# Patient Record
Sex: Female | Born: 1940 | Race: White | Hispanic: No | Marital: Married | Smoking: Former smoker
Health system: Southern US, Community
[De-identification: ages and names within clinical notes are randomized; demographics above are authoritative.]

## PROBLEM LIST (undated history)

## (undated) DIAGNOSIS — J449 Chronic obstructive pulmonary disease, unspecified: Secondary | ICD-10-CM

## (undated) DIAGNOSIS — Z148 Genetic carrier of other disease: Secondary | ICD-10-CM

## (undated) DIAGNOSIS — N951 Menopausal and female climacteric states: Secondary | ICD-10-CM

## (undated) DIAGNOSIS — E785 Hyperlipidemia, unspecified: Secondary | ICD-10-CM

## (undated) DIAGNOSIS — I10 Essential (primary) hypertension: Secondary | ICD-10-CM

## (undated) DIAGNOSIS — J9690 Respiratory failure, unspecified, unspecified whether with hypoxia or hypercapnia: Secondary | ICD-10-CM

## (undated) HISTORY — PX: TONSILLECTOMY: SHX5217

## (undated) HISTORY — DX: Genetic carrier of other disease: Z14.8

## (undated) HISTORY — DX: Respiratory failure, unspecified, unspecified whether with hypoxia or hypercapnia: J96.90

## (undated) HISTORY — DX: Hyperlipidemia, unspecified: E78.5

## (undated) HISTORY — DX: Chronic obstructive pulmonary disease, unspecified: J44.9

## (undated) HISTORY — DX: Essential (primary) hypertension: I10

## (undated) HISTORY — DX: Menopausal and female climacteric states: N95.1

---

## 2000-01-26 ENCOUNTER — Ambulatory Visit (HOSPITAL_COMMUNITY): Admission: RE | Admit: 2000-01-26 | Discharge: 2000-01-26 | Payer: Self-pay | Admitting: Pulmonary Disease

## 2000-01-26 ENCOUNTER — Encounter: Payer: Self-pay | Admitting: Pulmonary Disease

## 2000-02-04 ENCOUNTER — Encounter (HOSPITAL_COMMUNITY): Admission: RE | Admit: 2000-02-04 | Discharge: 2000-05-04 | Payer: Self-pay | Admitting: Pulmonary Disease

## 2000-03-29 ENCOUNTER — Ambulatory Visit: Admission: RE | Admit: 2000-03-29 | Discharge: 2000-03-29 | Payer: Self-pay | Admitting: Pulmonary Disease

## 2000-05-05 ENCOUNTER — Encounter (HOSPITAL_COMMUNITY): Admission: RE | Admit: 2000-05-05 | Discharge: 2000-08-03 | Payer: Self-pay | Admitting: Pulmonary Disease

## 2003-10-02 ENCOUNTER — Emergency Department (HOSPITAL_COMMUNITY): Admission: EM | Admit: 2003-10-02 | Discharge: 2003-10-02 | Payer: Self-pay | Admitting: Emergency Medicine

## 2003-10-08 ENCOUNTER — Emergency Department (HOSPITAL_COMMUNITY): Admission: EM | Admit: 2003-10-08 | Discharge: 2003-10-08 | Payer: Self-pay | Admitting: Emergency Medicine

## 2006-04-26 ENCOUNTER — Ambulatory Visit: Payer: Self-pay | Admitting: Internal Medicine

## 2006-04-26 LAB — CONVERTED CEMR LAB
AST: 28 units/L (ref 0–37)
Cholesterol: 196 mg/dL (ref 0–200)

## 2006-05-31 ENCOUNTER — Ambulatory Visit: Payer: Self-pay | Admitting: Family Medicine

## 2006-06-16 ENCOUNTER — Ambulatory Visit: Payer: Self-pay | Admitting: Internal Medicine

## 2006-07-13 DIAGNOSIS — E785 Hyperlipidemia, unspecified: Secondary | ICD-10-CM

## 2006-07-13 DIAGNOSIS — N951 Menopausal and female climacteric states: Secondary | ICD-10-CM

## 2006-07-13 DIAGNOSIS — I10 Essential (primary) hypertension: Secondary | ICD-10-CM | POA: Insufficient documentation

## 2006-07-13 DIAGNOSIS — J449 Chronic obstructive pulmonary disease, unspecified: Secondary | ICD-10-CM

## 2006-07-13 DIAGNOSIS — J4489 Other specified chronic obstructive pulmonary disease: Secondary | ICD-10-CM

## 2006-07-13 HISTORY — DX: Hyperlipidemia, unspecified: E78.5

## 2006-07-13 HISTORY — DX: Menopausal and female climacteric states: N95.1

## 2006-07-13 HISTORY — DX: Essential (primary) hypertension: I10

## 2006-07-13 HISTORY — DX: Other specified chronic obstructive pulmonary disease: J44.89

## 2006-07-13 HISTORY — DX: Chronic obstructive pulmonary disease, unspecified: J44.9

## 2006-07-28 ENCOUNTER — Ambulatory Visit: Payer: Self-pay | Admitting: Internal Medicine

## 2006-10-19 ENCOUNTER — Ambulatory Visit: Payer: Self-pay | Admitting: Internal Medicine

## 2006-10-19 LAB — CONVERTED CEMR LAB
ALT: 30 units/L (ref 0–35)
AST: 30 units/L (ref 0–37)
Albumin: 4.2 g/dL (ref 3.5–5.2)
Alkaline Phosphatase: 60 units/L (ref 39–117)
BUN: 16 mg/dL (ref 6–23)
Basophils Absolute: 0 10*3/uL (ref 0.0–0.1)
Basophils Relative: 0.4 % (ref 0.0–1.0)
Bilirubin Urine: NEGATIVE
Bilirubin, Direct: 0.1 mg/dL (ref 0.0–0.3)
CO2: 35 meq/L — ABNORMAL HIGH (ref 19–32)
Calcium: 9.6 mg/dL (ref 8.4–10.5)
Chloride: 102 meq/L (ref 96–112)
Cholesterol: 202 mg/dL (ref 0–200)
Creatinine, Ser: 0.6 mg/dL (ref 0.4–1.2)
Direct LDL: 127.9 mg/dL
Eosinophils Absolute: 0.1 10*3/uL (ref 0.0–0.6)
Eosinophils Relative: 2.1 % (ref 0.0–5.0)
GFR calc Af Amer: 129 mL/min
GFR calc non Af Amer: 107 mL/min
Glucose, Bld: 81 mg/dL (ref 70–99)
Glucose, Urine, Semiquant: NEGATIVE
HCT: 41.3 % (ref 36.0–46.0)
HDL: 56.3 mg/dL (ref >39.0)
Hemoglobin: 14.6 g/dL (ref 12.0–15.0)
Ketones, urine, test strip: NEGATIVE
Lymphocytes Relative: 34 % (ref 12.0–46.0)
MCHC: 35.4 g/dL (ref 30.0–36.0)
MCV: 92.6 fL (ref 78.0–100.0)
Monocytes Absolute: 0.4 10*3/uL (ref 0.2–0.7)
Monocytes Relative: 9.3 % (ref 3.0–11.0)
Neutro Abs: 2.7 10*3/uL (ref 1.4–7.7)
Neutrophils Relative %: 54.2 % (ref 43.0–77.0)
Nitrite: NEGATIVE
Platelets: 223 10*3/uL (ref 150–400)
Potassium: 3.8 meq/L (ref 3.5–5.1)
Protein, U semiquant: NEGATIVE
RBC: 4.46 M/uL (ref 3.87–5.11)
RDW: 11.9 % (ref 11.5–14.6)
Sodium: 142 meq/L (ref 135–145)
Specific Gravity, Urine: 1.02
TSH: 0.82 microintl units/mL (ref 0.35–5.50)
Total Bilirubin: 1.2 mg/dL (ref 0.3–1.2)
Total CHOL/HDL Ratio: 3.6
Total Protein: 6.5 g/dL (ref 6.0–8.3)
Triglycerides: 134 mg/dL (ref 0–149)
Urobilinogen, UA: 0.2
VLDL: 27 mg/dL (ref 0–40)
WBC Urine, dipstick: NEGATIVE
WBC: 4.8 10*3/uL (ref 4.5–10.5)
pH: 5.5

## 2006-10-26 ENCOUNTER — Encounter: Payer: Self-pay | Admitting: Internal Medicine

## 2006-10-26 ENCOUNTER — Other Ambulatory Visit: Admission: RE | Admit: 2006-10-26 | Discharge: 2006-10-26 | Payer: Self-pay | Admitting: Internal Medicine

## 2006-10-26 ENCOUNTER — Ambulatory Visit: Payer: Self-pay | Admitting: Internal Medicine

## 2007-10-24 ENCOUNTER — Ambulatory Visit: Payer: Self-pay | Admitting: Internal Medicine

## 2007-10-24 DIAGNOSIS — H9209 Otalgia, unspecified ear: Secondary | ICD-10-CM | POA: Insufficient documentation

## 2007-11-07 ENCOUNTER — Ambulatory Visit: Payer: Self-pay | Admitting: Internal Medicine

## 2007-12-05 ENCOUNTER — Ambulatory Visit: Payer: Self-pay | Admitting: Internal Medicine

## 2008-10-22 ENCOUNTER — Telehealth: Payer: Self-pay | Admitting: Internal Medicine

## 2008-11-26 ENCOUNTER — Ambulatory Visit: Payer: Self-pay | Admitting: Internal Medicine

## 2008-11-26 ENCOUNTER — Encounter (INDEPENDENT_AMBULATORY_CARE_PROVIDER_SITE_OTHER): Payer: Self-pay | Admitting: *Deleted

## 2008-11-26 LAB — CONVERTED CEMR LAB
ALT: 27 units/L (ref 0–35)
AST: 27 units/L (ref 0–37)
Albumin: 4.5 g/dL (ref 3.5–5.2)
Alkaline Phosphatase: 48 units/L (ref 39–117)
BUN: 13 mg/dL (ref 6–23)
Basophils Absolute: 0 10*3/uL (ref 0.0–0.1)
Basophils Relative: 0 % (ref 0.0–3.0)
Bilirubin, Direct: 0.1 mg/dL (ref 0.0–0.3)
CO2: 34 meq/L — ABNORMAL HIGH (ref 19–32)
Calcium: 9.7 mg/dL (ref 8.4–10.5)
Chloride: 104 meq/L (ref 96–112)
Cholesterol: 174 mg/dL (ref 0–200)
Creatinine, Ser: 0.6 mg/dL (ref 0.4–1.2)
Eosinophils Absolute: 0.1 10*3/uL (ref 0.0–0.7)
Eosinophils Relative: 1.3 % (ref 0.0–5.0)
GFR calc non Af Amer: 105.78 mL/min (ref >60)
Glucose, Bld: 88 mg/dL (ref 70–99)
HCT: 42 % (ref 36.0–46.0)
HDL: 64.1 mg/dL (ref >39.00)
Hemoglobin: 14.1 g/dL (ref 12.0–15.0)
LDL Cholesterol: 91 mg/dL (ref 0–99)
Lymphocytes Relative: 33.6 % (ref 12.0–46.0)
Lymphs Abs: 1.5 10*3/uL (ref 0.7–4.0)
MCHC: 33.6 g/dL (ref 30.0–36.0)
MCV: 98.9 fL (ref 78.0–100.0)
Monocytes Absolute: 0.5 10*3/uL (ref 0.1–1.0)
Monocytes Relative: 10.4 % (ref 3.0–12.0)
Neutro Abs: 2.5 10*3/uL (ref 1.4–7.7)
Neutrophils Relative %: 54.7 % (ref 43.0–77.0)
Platelets: 213 10*3/uL (ref 150.0–400.0)
Potassium: 4.4 meq/L (ref 3.5–5.1)
RBC: 4.24 M/uL (ref 3.87–5.11)
RDW: 12 % (ref 11.5–14.6)
Sodium: 144 meq/L (ref 135–145)
TSH: 0.67 microintl units/mL (ref 0.35–5.50)
Total Bilirubin: 1.1 mg/dL (ref 0.3–1.2)
Total CHOL/HDL Ratio: 3
Total Protein: 7 g/dL (ref 6.0–8.3)
Triglycerides: 93 mg/dL (ref 0.0–149.0)
VLDL: 18.6 mg/dL (ref 0.0–40.0)
WBC: 4.6 10*3/uL (ref 4.5–10.5)

## 2009-01-02 ENCOUNTER — Encounter (INDEPENDENT_AMBULATORY_CARE_PROVIDER_SITE_OTHER): Payer: Self-pay | Admitting: *Deleted

## 2009-01-06 ENCOUNTER — Ambulatory Visit: Payer: Self-pay | Admitting: Gastroenterology

## 2009-01-16 ENCOUNTER — Telehealth: Payer: Self-pay | Admitting: Internal Medicine

## 2010-02-03 NOTE — Progress Notes (Signed)
  Phone Note From Pharmacy Call back at [phone]   Caller: cathy, [facility] Call For: k  Summary of Call: needs alternative to Atrovent- too expensive Initial call taken by: Raechel Ache, RN,  January 16, 2009 11:35 AM  Follow-up for Phone Call        atrovent is available in [facility] Follow-up by: Gordy Savers  MD,  January 16, 2009 12:07 PM  Additional Follow-up for Phone Call Additional follow up Details #1::        Pharmacist called Additional Follow-up by: Raechel Ache, RN,  January 16, 2009 12:32 PM

## 2010-02-03 NOTE — Miscellaneous (Signed)
Summary: DIRECT COLON SCREEN-AGE/YF  Clinical Lists Changes  Medications: Added new medication of MOVIPREP 100 GM  SOLR (PEG-KCL-NACL-NASULF-NA ASC-C) As directed - Signed Rx of MOVIPREP 100 GM  SOLR (PEG-KCL-NACL-NASULF-NA ASC-C) As directed;  #1 x 0;  Signed;  Entered by: Clide Cliff RN;  Authorized by: Rachael Fee MD;  Method used: Electronically to [id number], [place], [place], [place]  [address], Ph: [phone] or [phone], Fax: [phone] Observations: Added new observation of ALLERGY REV: Done (01/06/2009 10:16)    Prescriptions: MOVIPREP 100 GM  SOLR (PEG-KCL-NACL-NASULF-NA ASC-C) As directed  #1 x 0   Entered by:   Clide Cliff RN   Authorized by:   Rachael Fee MD   Signed by:   Clide Cliff RN on 01/06/2009   Method used:   Electronically to        [facility] [address] [phone]* (retail)       [address]       [place], [place]  [address]       Ph: [phone] or [phone]       Fax: [phone]   RxID:   [id number]

## 2010-02-09 ENCOUNTER — Encounter: Payer: Self-pay | Admitting: Internal Medicine

## 2010-02-09 ENCOUNTER — Ambulatory Visit (INDEPENDENT_AMBULATORY_CARE_PROVIDER_SITE_OTHER): Payer: PRIVATE HEALTH INSURANCE | Admitting: Internal Medicine

## 2010-02-09 DIAGNOSIS — I10 Essential (primary) hypertension: Secondary | ICD-10-CM

## 2010-02-09 DIAGNOSIS — E785 Hyperlipidemia, unspecified: Secondary | ICD-10-CM

## 2010-02-09 DIAGNOSIS — N951 Menopausal and female climacteric states: Secondary | ICD-10-CM

## 2010-02-09 DIAGNOSIS — J4489 Other specified chronic obstructive pulmonary disease: Secondary | ICD-10-CM

## 2010-02-09 DIAGNOSIS — J449 Chronic obstructive pulmonary disease, unspecified: Secondary | ICD-10-CM

## 2010-02-09 MED ORDER — IPRATROPIUM-ALBUTEROL 18-103 MCG/ACT IN AERO: 2.0000 | INHALATION_SPRAY | Freq: Four times a day (QID) | RESPIRATORY_TRACT | Status: DC | PRN

## 2010-02-09 MED ORDER — SIMVASTATIN 40 MG PO TABS: 40.0000 mg | ORAL_TABLET | Freq: Every day | ORAL | Status: DC

## 2010-02-09 MED ORDER — HYDROCHLOROTHIAZIDE 12.5 MG PO TABS: 12.5000 mg | ORAL_TABLET | Freq: Every day | ORAL | Status: DC

## 2010-02-09 MED ORDER — TRAMADOL HCL 50 MG PO TABS: 50.0000 mg | ORAL_TABLET | Freq: Four times a day (QID) | ORAL | Status: DC | PRN

## 2010-02-09 NOTE — Patient Instructions (Signed)
Return in 3 months for follow-up Call or return to clinic prn if these symptoms worsen or fail to improve as anticipated.  Take 650-100 mg of Tylenol every 6 hours as needed for pain relief or fever.  Avoid taking more than 3000 mg in a 24-hour period (  This may cause liver damage).

## 2010-02-09 NOTE — Progress Notes (Signed)
  Subjective:    Patient ID: Cathy Hensley, female    DOB: 11-07-1940, 70 y.o.   MRN: [id number]  HPI  70 year old patient who presents with a one week history of headaches.  They seem to be most marked in the right parietal scalp area.  She apparent has had some trauma in this area required extensive suturing.  Associated symptoms include some myalgias and a sense of low grade fever and chilliness.  No documented fever or frank chills.  She is a former smoker, discontinued in 2006.  Her COPD and pulmonary status seems stable.  She has hypertension, controlled on diuretic therapy, which has been well.  Remains on simvastatin 40 mg daily, which she Continues to tolerate well.  Review of Systems  Constitutional: Positive for fatigue.  HENT: Negative for hearing loss, congestion, sore throat, rhinorrhea, dental problem, sinus pressure and tinnitus.   Eyes: Negative for pain, discharge and visual disturbance.  Respiratory: Negative for cough and shortness of breath.   Cardiovascular: Positive for leg swelling. Negative for chest pain and palpitations.  Gastrointestinal: Negative for nausea, vomiting, abdominal pain, diarrhea, constipation, blood in stool and abdominal distention.  Genitourinary: Negative for dysuria, urgency, frequency, hematuria, flank pain, vaginal bleeding, vaginal discharge, difficulty urinating, vaginal pain and pelvic pain.  Musculoskeletal: Negative for joint swelling, arthralgias and gait problem.  Skin: Negative for rash.  Neurological: Negative for dizziness, syncope, speech difficulty, weakness, numbness and headaches.  Hematological: Negative for adenopathy. Does not bruise/bleed easily.  Psychiatric/Behavioral: Negative for behavioral problems, dysphoric mood and agitation. The patient is not nervous/anxious.        Objective:   Physical Exam  Constitutional: She is oriented to person, place, and time. She appears well-developed and well-nourished.  HENT:  Head:  Normocephalic and atraumatic.  Right Ear: External ear normal.  Left Ear: External ear normal.  Nose: Nose normal.  Mouth/Throat: Oropharynx is clear and moist.       No tenderness over the scalp region  Eyes: Conjunctivae and EOM are normal. Pupils are equal, round, and reactive to light.  Neck: Normal range of motion. Neck supple. No thyromegaly present.  Cardiovascular: Normal rate, regular rhythm, normal heart sounds and intact distal pulses.   Pulmonary/Chest: Effort normal and breath sounds normal.  Abdominal: Soft. Bowel sounds are normal. She exhibits no mass. There is no tenderness.  Musculoskeletal: Normal range of motion.       Trace ankle and pedal edema  Lymphadenopathy:    She has no cervical adenopathy.  Neurological: She is alert and oriented to person, place, and time.  Skin: Skin is warm and dry. No rash noted.  Psychiatric: She has a normal mood and affect. Her behavior is normal.          Assessment & Plan:  Headache, secondary to viral syndrome with myalgias-Will treat symptomatically with Tylenol  and if needed, tramadol for additional pain control Hypertension stable Hyperlipidemia stable COPD stable

## 2010-02-10 LAB — TSH: TSH: 0.87 u[IU]/mL (ref 0.35–5.50)

## 2010-02-10 LAB — HEPATIC FUNCTION PANEL
Albumin: 4.4 g/dL (ref 3.5–5.2)
Alkaline Phosphatase: 51 U/L (ref 39–117)
Bilirubin, Direct: 0.1 mg/dL (ref 0.0–0.3)
Total Bilirubin: 1 mg/dL (ref 0.3–1.2)

## 2010-02-10 LAB — CBC WITH DIFFERENTIAL/PLATELET
Basophils Absolute: 0 10*3/uL (ref 0.0–0.1)
Hemoglobin: 15.4 g/dL — ABNORMAL HIGH (ref 12.0–15.0)
Lymphocytes Relative: 20.8 % (ref 12.0–46.0)
Monocytes Relative: 10.1 % (ref 3.0–12.0)
Neutro Abs: 5.1 10*3/uL (ref 1.4–7.7)
Platelets: 250 10*3/uL (ref 150.0–400.0)
RDW: 14.4 % (ref 11.5–14.6)

## 2010-02-10 LAB — BASIC METABOLIC PANEL
Calcium: 10.2 mg/dL (ref 8.4–10.5)
GFR: 146.9 mL/min (ref >60.00)
Glucose, Bld: 81 mg/dL (ref 70–99)
Sodium: 144 mEq/L (ref 135–145)

## 2010-02-10 LAB — LIPID PANEL
HDL: 61.2 mg/dL (ref >39.00)
VLDL: 19.6 mg/dL (ref 0.0–40.0)

## 2010-02-17 ENCOUNTER — Ambulatory Visit: Payer: Self-pay | Admitting: Internal Medicine

## 2010-03-04 ENCOUNTER — Encounter: Payer: Self-pay | Admitting: Internal Medicine

## 2010-03-05 ENCOUNTER — Encounter: Payer: Self-pay | Admitting: Internal Medicine

## 2010-03-05 ENCOUNTER — Ambulatory Visit (INDEPENDENT_AMBULATORY_CARE_PROVIDER_SITE_OTHER): Payer: PRIVATE HEALTH INSURANCE | Admitting: Internal Medicine

## 2010-03-05 VITALS — BP 140/90 | HR 80 | Temp 98.1°F | Resp 18 | Wt 105.0 lb

## 2010-03-05 DIAGNOSIS — I279 Pulmonary heart disease, unspecified: Secondary | ICD-10-CM

## 2010-03-05 DIAGNOSIS — J449 Chronic obstructive pulmonary disease, unspecified: Secondary | ICD-10-CM

## 2010-03-05 DIAGNOSIS — I10 Essential (primary) hypertension: Secondary | ICD-10-CM

## 2010-03-05 DIAGNOSIS — I2781 Cor pulmonale (chronic): Secondary | ICD-10-CM

## 2010-03-05 MED ORDER — TIOTROPIUM BROMIDE MONOHYDRATE 18 MCG IN CAPS: 18.0000 ug | ORAL_CAPSULE | Freq: Every day | RESPIRATORY_TRACT | Status: DC

## 2010-03-05 MED ORDER — ALBUTEROL SULFATE HFA 108 (90 BASE) MCG/ACT IN AERS: 2.0000 | INHALATION_SPRAY | Freq: Four times a day (QID) | RESPIRATORY_TRACT | Status: DC | PRN

## 2010-03-05 MED ORDER — FUROSEMIDE 20 MG PO TABS: 20.0000 mg | ORAL_TABLET | Freq: Every day | ORAL | Status: DC

## 2010-03-05 MED ORDER — BUDESONIDE-FORMOTEROL FUMARATE 160-4.5 MCG/ACT IN AERO: 2.0000 | INHALATION_SPRAY | Freq: Two times a day (BID) | RESPIRATORY_TRACT | Status: DC

## 2010-03-05 NOTE — Patient Instructions (Addendum)
Pulmonary followup as scheduled   2-D echocardiogram as scheduled   Use oxygen therapy 24 hours per day   Return office visit 2 weeks  Limit your sodium (Salt) intake

## 2010-03-05 NOTE — Progress Notes (Signed)
Subjective:    Patient ID: Cathy Hensley, female    DOB: 10-07-1940, 70 y.o.   MRN: [id number]  HPI   70 year old patient who has a history of COPD but is in quite infrequently. She has been evaluated by pulmonary medicine in the past. Her present medical regimen includes Combivent. Due to a worsening peripheral edema she was seen  At a [place] emergency room on February 26. She presented with a chief complaint of worsening pedal edema. The patient states that she has had fatigue and a general sense of unwellness for several months. She tends to minimize her symptoms;  evaluation included a chest CT to rule out  Pulmonary  embolism in view of marked hypoxemia.   BNP was elevated at 1237; an arterial blood gas revealed a PCO2 of  59,  PO2 of  41 and  oxygen saturation of 75%. Her peripheral edema has modestly improved she remains on hydrochlorothiazide for hypertensive control.  She states that the peripheral edema has worsened over the past 4-5 weeks;  she discontinued tobacco use in 2003.       Review of Systems  Constitutional: Positive for fatigue.  HENT: Negative for hearing loss, congestion, sore throat, rhinorrhea, dental problem, sinus pressure and tinnitus.   Eyes: Negative for pain, discharge and visual disturbance.  Respiratory: Positive for shortness of breath. Negative for cough.   Cardiovascular: Positive for leg swelling. Negative for chest pain and palpitations.  Gastrointestinal: Negative for nausea, vomiting, abdominal pain, diarrhea, constipation, blood in stool and abdominal distention.  Genitourinary: Negative for dysuria, urgency, frequency, hematuria, flank pain, vaginal bleeding, vaginal discharge, difficulty urinating, vaginal pain and pelvic pain.  Musculoskeletal: Negative for joint swelling, arthralgias and gait problem.  Skin: Negative for rash.  Neurological: Negative for dizziness, syncope, speech difficulty, weakness, numbness and headaches.  Hematological: Negative  for adenopathy.  Psychiatric/Behavioral: Negative for behavioral problems, dysphoric mood and agitation. The patient is not nervous/anxious.        Objective:   Physical Exam  Constitutional: She is oriented to person, place, and time. She appears well-developed and well-nourished.        Appears chronically ill no acute distress at rest  HENT:  Head: Normocephalic.  Right Ear: External ear normal.  Left Ear: External ear normal.  Mouth/Throat: Oropharynx is clear and moist.  Eyes: Conjunctivae and EOM are normal. Pupils are equal, round, and reactive to light.  Neck: Normal range of motion. Neck supple. No JVD present. No thyromegaly present.  Cardiovascular: Normal rate, regular rhythm, normal heart sounds and intact distal pulses.   Pulmonary/Chest: Effort normal. No respiratory distress. She has no wheezes. She has no rales. She exhibits no tenderness.        Generally diminished breath sounds  Oxygen saturation 85%  Pulse rate 90  Abdominal: Soft. Bowel sounds are normal. She exhibits no mass. There is no tenderness.  Musculoskeletal: Normal range of motion. She exhibits edema.        +2 pedal and ankle edema  Lymphadenopathy:    She has no cervical adenopathy.  Neurological: She is alert and oriented to person, place, and time.  Skin: Skin is warm and dry. No rash noted.  Psychiatric: She has a normal mood and affect. Her behavior is normal.          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 Will refer to pulmonary medicine we'll discontinue Combivent and substitute albuterol. We'll add Spiriva and Symbicort;   The patient will require chronic oxygen therapy and may benefit from pulmonary rehabilitation.  we'll substitute furosemide for hydrochlorothiazide  Salt restriction encouraged

## 2010-03-18 ENCOUNTER — Encounter: Payer: Self-pay | Admitting: Internal Medicine

## 2010-03-19 ENCOUNTER — Ambulatory Visit (INDEPENDENT_AMBULATORY_CARE_PROVIDER_SITE_OTHER): Payer: PRIVATE HEALTH INSURANCE | Admitting: Internal Medicine

## 2010-03-19 ENCOUNTER — Encounter: Payer: Self-pay | Admitting: Internal Medicine

## 2010-03-19 DIAGNOSIS — Z0000 Encounter for general adult medical examination without abnormal findings: Secondary | ICD-10-CM

## 2010-03-19 DIAGNOSIS — J449 Chronic obstructive pulmonary disease, unspecified: Secondary | ICD-10-CM

## 2010-03-19 DIAGNOSIS — Z23 Encounter for immunization: Secondary | ICD-10-CM

## 2010-03-19 DIAGNOSIS — I10 Essential (primary) hypertension: Secondary | ICD-10-CM

## 2010-03-19 NOTE — Progress Notes (Signed)
  Subjective:    Patient ID: Cathy Hensley, female    DOB: [birth year], 70 y.o.   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 She is seen today in two-week followup and doing much improved. Furosemide was substituted for hydrochlorothiazide and her pedal edema has resolved she feels much improved breathing better and has been compliant with her oxygen therapy. Her weight is up modestly she is accompanied by her daughter who is also pleased with her nice progress. The plan was to refer her to pulmonary medicine and consider pulmonary rehabilitation but the patient is quite resistant at this time   Review of Systems  Constitutional: Negative.   HENT: Negative for hearing loss, congestion, sore throat, rhinorrhea, dental problem, sinus pressure and tinnitus.   Eyes: Negative for pain, discharge and visual disturbance.  Respiratory: Positive for shortness of breath. Negative for cough.   Cardiovascular: Negative for chest pain, palpitations and leg swelling.  Gastrointestinal: Negative for nausea, vomiting, abdominal pain, diarrhea, constipation, blood in stool and abdominal distention.  Genitourinary: Negative for dysuria, urgency, frequency, hematuria, flank pain, vaginal bleeding, vaginal discharge, difficulty urinating, vaginal pain and pelvic pain.  Musculoskeletal: Negative for joint swelling, arthralgias and gait problem.  Skin: Negative for rash.  Neurological: Negative for dizziness, syncope, speech difficulty, weakness, numbness and headaches.  Hematological: Negative for adenopathy.  Psychiatric/Behavioral: Negative for behavioral problems, dysphoric mood and agitation. The patient is not nervous/anxious.        Objective:   Physical Exam  Constitutional: She is oriented to person, place, and time. She appears well-developed and well-nourished.  HENT:  Head: Normocephalic.  Right Ear: External ear normal.  Left  Ear: External ear normal.  Mouth/Throat: Oropharynx is clear and moist.  Eyes: Conjunctivae and EOM are normal. Pupils are equal, round, and reactive to light.  Neck: Normal range of motion. Neck supple. No thyromegaly present.  Cardiovascular: Normal rate, regular rhythm, normal heart sounds and intact distal pulses.   Pulmonary/Chest: Effort normal. No respiratory distress. She has no wheezes. She has no rales.        Generally  diminished breath sounds but clear  Abdominal: Soft. Bowel sounds are normal. She exhibits no mass. There is no tenderness.  Musculoskeletal: Normal range of motion. She exhibits no edema.  Lymphadenopathy:    She has no cervical adenopathy.  Neurological: She is alert and oriented to person, place, and time.  Skin: Skin is warm and dry. No rash noted.  Psychiatric: She has a normal mood and affect. Her behavior is normal.          Assessment & Plan:   advanced COPD with chronic hypoxic  Respiratory failure  improved  pedal edema improved   Pulmonary referral and rehabilitation discussed and she wishes to defer at this time. We'll give a Pneumovax and recheck in 3 months

## 2010-03-19 NOTE — Patient Instructions (Signed)
Limit your sodium (Salt) intake  Return in 3 months for follow-up   

## 2010-05-19 NOTE — Assessment & Plan Note (Signed)
[facility]                             PULMONARY OFFICE NOTE   NAME:Cathy Hensley                            MRN:          [id number]  DATE:06/16/2006                            DOB:          Sep 22, 1940    REASON FOR CONSULTATION:  COPD evaluation.   HISTORY:  This is a 70 year old white female who quit smoking 5 years  ago because of severe dyspnea and cough.  She said she had done  wonderfully for several years, but within the last several years, had  increasing dyspnea, for which she needed Atrovent.  She also had tried  Advair and Singulair, and says of all the medicines the 1 that seemed to  make the most difference was Singulair, which also helped her rhinitis.  She comes back in today having run out of all of her medicines, except  for Atrovent, stating that she is not doing as well.   She had been seen, actually, by Dr. Sung Amabile, who is not convinced that  she had significant COPD and recommended first stopping fenoterol and  then using Atrovent sparingly.  She found no difference on or off  fenoterol, but does feel she could take the Atrovent from 0 up to 4  times daily and control her symptoms effectively.   She denies any obvious weather or environmental trigger.  Presently, she  complains of dyspnea with anything more than slow ADL, but denies any  nocturnal exacerbation, obvious weather or environmental triggers.   PAST MEDICAL HISTORY:  Significant for COPD, hypertension.   ALLERGIES:  No known.   MEDICATIONS:  Zocor.  Hydrochlorothiazide.  Prednisone, which she is now  tapering off of after a recent exacerbation and states it helped only a  little.   SOCIAL HISTORY:  She quit smoking 5 years ago.  She is self-employed as  an Production designer, theatre/television/film.   FAMILY HISTORY:  Positive for emphysema in mother, who was a smoker.   REVIEW OF SYSTEMS:  Taken in detail on the worksheet and negative,  except as outlined above.   PHYSICAL EXAMINATION:   This is a stoic, ambulatory white female with an  unusual affect who actually didn't answer a single question I asked her  in a straight-forward fashion.  VITAL SIGNS:  Stable vital signs.  HEENT:  Unremarkable.  Oropharynx is clear.  NECK:  Supple without cervical adenopathy or tenderness. Trachea is  midline.  No thyromegaly.  LUNGS:  The lung fields reveal diminished breath sounds bilaterally.  No  wheezing.  There is a positive Hoover sign at the end of inspiration.  CARDIAC:  Regular rate and rhythm without murmur, gallop, or rub.  ABDOMEN:  Soft and benign.  EXTREMITIES:  Warm without calf tenderness, cyanosis, clubbing, or  edema.   PFTs were reviewed from 2002 indicating an FEV1 at 66% predicted with a  ratio of 54%.  This, however, was while she was still smoking.  Diffusion capacity was 68% also.   IMPRESSION:  This patient has at least moderate chronic obstructive  pulmonary disease by previous PFTs, but interestingly feels  the best  when she takes Singulair I believe because it helps with both the  rhinitis and the asthmatic component of her problem.  If this is true,  she should be able to restart the Singulair and come off the prednisone  smoothly with no flare-up of her symptoms.   I, therefore, recommended the following.  1. Singulair 10 mg q. p.m. for the next 6 weeks.  2. Continue to use Atrovent on a p.r.n. basis.  3. Followup.  If her symptoms exacerbate on Singulair, I would abandon      it in favor of Advair if she prefers a PPI, or Symbicort (not      approved for COPD, but if she has significant exacerbation it is      probably an asthmatic in this setting) if she prefers HFA.     Cathy Hensley. Cathy Sires, MD, [facility]  Electronically Signed    MBW/MedQ  DD: 06/16/2006  DT: 06/17/2006  Job #: [id number]   cc:   Cathy Savers, MD

## 2010-05-19 NOTE — Assessment & Plan Note (Signed)
[facility]                             PULMONARY OFFICE NOTE   NAME:Cathy Hensley                            MRN:          [id number]  DATE:07/28/2006                            DOB:          1940/07/03    PULMONARY EXTENDED OFFICE EVALUATION:   HISTORY:  A 70 year old white female, former smoker, returns for PFTs as  requested, complaining of shortness of breath with anything more than  slow ADLs.  She has remained oxygen dependent at home.  She denies any  fevers, chills, orthopnea, PND, or leg swelling.   PHYSICAL EXAMINATION:  She is a pleasant, ambulatory white female in no  acute distress.  She is afebrile with normal vital signs.  HEENT:  Unremarkable.  Her oropharynx is clear.  LUNGS:  Lung fields reveal diminished breath sounds bilaterally.  No  wheezing.  HEART:  Regular rhythm without murmur, rub or gallop.  ABDOMEN:  Soft, benign.  EXTREMITIES:  Warm without calf tenderness, clubbing, cyanosis or edema.   PFTs were performed today and indicate an FEV1 of 36% predicted with no  improvement after bronchodilators.  Diffusion capacity was 58%, and she  had an FRC of 151% predicted.   IMPRESSION:  This patient has classic emphysematous chronic obstructive  pulmonary disease that has worsened since her previous evaluation in  2002 substantially.  Unfortunately, she resumed smoking since her  evaluation in 2002, and I think that is the most likely explanation.  She does not have the typical frequent cough or exacerbations of  asthmatic chronic obstructive pulmonary disease, and I believe this is  predominantly emphysema.   I therefore recommend the following:  1. A trial of Spiriva 18 mcg daily.  2. I do not really believe Singulair is benefitting her, except      perhaps in terms of treatment and rhinitis.  I think it is fine to      stop it to see if any symptoms flare off of Singulair.  I spent      extra time teaching her Spiriva and  going over this and how to use      it effectively and her PFTs and the reasoning behind the      recommendation to take Spiriva and what she can expect (improved      activity tolerance).  3. Finally, I have arranged to see her back in three months, sooner if      needed.     Charlaine Dalton. Sherene Sires, MD, [facility]  Electronically Signed    MBW/MedQ  DD: 07/28/2006  DT: 07/29/2006  Job #: [id number]   cc:   Gordy Savers, MD

## 2010-05-22 NOTE — Assessment & Plan Note (Signed)
 HEALTHCARE                            [facility] OFFICE NOTE   NAME:Cathy Hensley                            MRN:          [id number]  DATE:04/26/2006                            DOB:          Nov 22, 1940    A 70 year old female seen today to establish with our practice.  She had  [facility] age 18.  Medical  problems include hypertension, dyslipidemia, and COPD.  She was  hospitalized in 1953 for a tonsillectomy.  She is a gravida II, para II,  abort zero.  She was placed on statin therapy last year.   REVIEW OF SYSTEMS:  Exam is negative.  Did have a full exam in October  2007 including pelvic and Pap.  Also, had a mammogram last year.  No  colon screening.   FAMILY HISTORY:  Father died in his 17s of alcohol related  complications.  Mother died at 39 of a stroke.  She also had a AAA  repair.  Two sisters deceased, one from lymphoma, one from cerebral  aneurysm.  One sister living who is status post vascular repair of a  AAA.   EXAMINATION:  Revealed a thin, healthy-appearing female, in no acute  distress.  Blood pressure is 140/80.  Home readings are much lower.  SKIN:  Negative.  Fundi, ears, and throat clear.  NECK:  No bruits or adenopathy.  CHEST:  Was clear.  CARDIOVASCULAR EXAM:  Normal heart sounds.  No murmurs.  ABDOMEN:  Benign.  No organomegaly or bruits.  EXTREMITIES:  Full peripheral pulses.  No edema.  NEUROLOGIC:  Negative.   IMPRESSION:  1. Hypertension.  2. Hyperlipidemia.  3. Menopausal syndrome.  4. Mild chronic obstructive pulmonary disease.   DISPOSITION:  Medical regimen unchanged.  She was also given a  prescription for albuterol.  Will reassess in 6 months.     Gordy Savers, MD  Electronically Signed    PFK/MedQ  DD: 04/26/2006  DT: 04/26/2006  Job #: [id number]

## 2010-06-18 ENCOUNTER — Encounter: Payer: Self-pay | Admitting: Internal Medicine

## 2010-06-18 ENCOUNTER — Ambulatory Visit (INDEPENDENT_AMBULATORY_CARE_PROVIDER_SITE_OTHER): Payer: PRIVATE HEALTH INSURANCE | Admitting: Internal Medicine

## 2010-06-18 DIAGNOSIS — J449 Chronic obstructive pulmonary disease, unspecified: Secondary | ICD-10-CM

## 2010-06-18 DIAGNOSIS — I10 Essential (primary) hypertension: Secondary | ICD-10-CM

## 2010-06-18 MED ORDER — TIOTROPIUM BROMIDE MONOHYDRATE 18 MCG IN CAPS: 18.0000 ug | ORAL_CAPSULE | Freq: Every day | RESPIRATORY_TRACT | Status: DC

## 2010-06-18 NOTE — Progress Notes (Signed)
  Subjective:    Patient ID: Cathy Hensley, female    DOB: 1940-11-23, 70 y.o.   MRN: [id number]  HPI  70 year old patient has a history of advanced COPD she has been on home oxygen that she basically uses only at night. She does have her own home oximeter and apparently her O2 saturations have been fine. She remains on Symbicort as well as Spiriva. Her palmar status has been stable. She has hypertension as well as dyslipidemia. No new concerns or complaints her blood pressure has been controlled off medications    Review of Systems  Constitutional: Negative.   HENT: Negative for hearing loss, congestion, sore throat, rhinorrhea, dental problem, sinus pressure and tinnitus.   Eyes: Negative for pain, discharge and visual disturbance.  Respiratory: Positive for shortness of breath. Negative for cough.   Cardiovascular: Negative for chest pain, palpitations and leg swelling.  Gastrointestinal: Negative for nausea, vomiting, abdominal pain, diarrhea, constipation, blood in stool and abdominal distention.  Genitourinary: Negative for dysuria, urgency, frequency, hematuria, flank pain, vaginal bleeding, vaginal discharge, difficulty urinating, vaginal pain and pelvic pain.  Musculoskeletal: Negative for joint swelling, arthralgias and gait problem.  Skin: Negative for rash.  Neurological: Negative for dizziness, syncope, speech difficulty, weakness, numbness and headaches.  Hematological: Negative for adenopathy.  Psychiatric/Behavioral: Negative for behavioral problems, dysphoric mood and agitation. The patient is not nervous/anxious.        Objective:   Physical Exam  Constitutional: She is oriented to person, place, and time. She appears well-developed and well-nourished.  HENT:  Head: Normocephalic.  Right Ear: External ear normal.  Left Ear: External ear normal.  Mouth/Throat: Oropharynx is clear and moist.  Eyes: Conjunctivae and EOM are normal. Pupils are equal, round, and reactive to  light.  Neck: Normal range of motion. Neck supple. No thyromegaly present.  Cardiovascular: Normal rate, regular rhythm, normal heart sounds and intact distal pulses.   Pulmonary/Chest: Effort normal.       Breath sounds diminished but clear. O2 saturation on room air 98%  Abdominal: Soft. Bowel sounds are normal. She exhibits no mass. There is no tenderness.  Musculoskeletal: Normal range of motion.  Lymphadenopathy:    She has no cervical adenopathy.  Neurological: She is alert and oriented to person, place, and time.  Skin: Skin is warm and dry. No rash noted.  Psychiatric: She has a normal mood and affect. Her behavior is normal.          Assessment & Plan:   COPD. Improved normal oxygenation on room air. We'll discontinue oxygen therapy. We'll continue aggressive pulmonary medications Hypertension stable off medications Dyslipidemia.  We'll recheck in 3 months

## 2010-06-18 NOTE — Patient Instructions (Signed)
Limit your sodium (Salt) intake    It is important that you exercise regularly, at least 20 minutes 3 to 4 times per week.  If you develop chest pain or shortness of breath seek  medical attention.  Return in 3 months for follow-up  

## 2010-06-25 ENCOUNTER — Telehealth: Payer: Self-pay | Admitting: Internal Medicine

## 2010-06-25 NOTE — Telephone Encounter (Signed)
Inquiring about getting a whooping cough. She cares for her grandchildren. Had a tetanus shot 6 years ago. Please call her tomorrow.

## 2010-06-26 NOTE — Telephone Encounter (Signed)
Advised patient to come in for Tdap

## 2010-06-29 ENCOUNTER — Ambulatory Visit (INDEPENDENT_AMBULATORY_CARE_PROVIDER_SITE_OTHER): Payer: PRIVATE HEALTH INSURANCE | Admitting: Family Medicine

## 2010-06-29 DIAGNOSIS — Z23 Encounter for immunization: Secondary | ICD-10-CM

## 2010-06-30 ENCOUNTER — Telehealth: Payer: Self-pay | Admitting: *Deleted

## 2010-06-30 NOTE — Telephone Encounter (Signed)
Pt states Selena Batten was supposed to have documentation sent to [facility] advising that pt has been released from oxygen therapy so that the equipment can be picked up.  Needs this to be done ASAP since patient is leaving to go back to [place].  Spoke with Tamela Oddi at [facility], ov note faxed to [facility].

## 2010-09-21 ENCOUNTER — Ambulatory Visit: Payer: PRIVATE HEALTH INSURANCE | Admitting: Internal Medicine

## 2010-10-01 ENCOUNTER — Ambulatory Visit: Payer: PRIVATE HEALTH INSURANCE | Admitting: Internal Medicine

## 2010-10-05 ENCOUNTER — Encounter: Payer: Self-pay | Admitting: Internal Medicine

## 2010-10-05 ENCOUNTER — Ambulatory Visit (INDEPENDENT_AMBULATORY_CARE_PROVIDER_SITE_OTHER): Payer: PRIVATE HEALTH INSURANCE | Admitting: Internal Medicine

## 2010-10-05 VITALS — BP 140/80 | Temp 98.2°F | Wt 108.0 lb

## 2010-10-05 DIAGNOSIS — J449 Chronic obstructive pulmonary disease, unspecified: Secondary | ICD-10-CM

## 2010-10-05 DIAGNOSIS — Z0000 Encounter for general adult medical examination without abnormal findings: Secondary | ICD-10-CM

## 2010-10-05 DIAGNOSIS — Z23 Encounter for immunization: Secondary | ICD-10-CM

## 2010-10-05 DIAGNOSIS — I10 Essential (primary) hypertension: Secondary | ICD-10-CM

## 2010-10-05 MED ORDER — TIOTROPIUM BROMIDE MONOHYDRATE 18 MCG IN CAPS: 18.0000 ug | ORAL_CAPSULE | Freq: Every day | RESPIRATORY_TRACT | Status: DC

## 2010-10-05 NOTE — Progress Notes (Signed)
  Subjective:    Patient ID: Cathy Hensley, female    DOB: 23-Mar-1940, 70 y.o.   MRN: [id number]  HPI  70 year old patient who has a history of fairly advanced COPD. She has done remarkably well since discontinuation of tobacco. She does monitor her home oxygen saturations with good readings. She is on a regimen of inhalational medications. Due to cost considerations she has been off Spiriva for the past 2 weeks. She has noted a minimal increase in her dyspnea. Otherwise she has done remarkably well no concerns or complaints. She is quite pleased with her status    Review of Systems  Constitutional: Negative.   HENT: Negative for hearing loss, congestion, sore throat, rhinorrhea, dental problem, sinus pressure and tinnitus.   Eyes: Negative for pain, discharge and visual disturbance.  Respiratory: Positive for shortness of breath. Negative for cough.   Cardiovascular: Negative for chest pain, palpitations and leg swelling.  Gastrointestinal: Negative for nausea, vomiting, abdominal pain, diarrhea, constipation, blood in stool and abdominal distention.  Genitourinary: Negative for dysuria, urgency, frequency, hematuria, flank pain, vaginal bleeding, vaginal discharge, difficulty urinating, vaginal pain and pelvic pain.  Musculoskeletal: Negative for joint swelling, arthralgias and gait problem.  Skin: Negative for rash.  Neurological: Negative for dizziness, syncope, speech difficulty, weakness, numbness and headaches.  Hematological: Negative for adenopathy.  Psychiatric/Behavioral: Negative for behavioral problems, dysphoric mood and agitation. The patient is not nervous/anxious.        Objective:   Physical Exam  Constitutional: She is oriented to person, place, and time. She appears well-developed and well-nourished.  HENT:  Head: Normocephalic.  Right Ear: External ear normal.  Left Ear: External ear normal.  Mouth/Throat: Oropharynx is clear and moist.  Eyes: Conjunctivae and EOM are  normal. Pupils are equal, round, and reactive to light.  Neck: Normal range of motion. Neck supple. No thyromegaly present.  Cardiovascular: Normal rate, regular rhythm, normal heart sounds and intact distal pulses.   Pulmonary/Chest: Effort normal and breath sounds normal.       Generally diminished breath sounds Oxygen saturations 93-95%  Abdominal: Soft. Bowel sounds are normal. She exhibits no mass. There is no tenderness.  Musculoskeletal: Normal range of motion.  Lymphadenopathy:    She has no cervical adenopathy.  Neurological: She is alert and oriented to person, place, and time.  Skin: Skin is warm and dry. No rash noted.  Psychiatric: She has a normal mood and affect. Her behavior is normal.          Assessment & Plan:   COPD. Well compensated Hypertension controlled  Medications will be unchanged. She will be rechecked in 6 months or when necessary.

## 2010-10-05 NOTE — Patient Instructions (Signed)
Limit your sodium (Salt) intake  Return in 6 months for follow-up  

## 2010-12-18 ENCOUNTER — Telehealth: Payer: Self-pay | Admitting: Internal Medicine

## 2010-12-18 NOTE — Telephone Encounter (Signed)
Please advise 

## 2010-12-18 NOTE — Telephone Encounter (Signed)
Pt is in [place] and is having symptoms of a sinus infections and has questions about medication she should take. Please contact

## 2010-12-18 NOTE — Telephone Encounter (Signed)
advil sinus  Every 8 hours

## 2010-12-18 NOTE — Telephone Encounter (Signed)
Spoke with pt - informed of dr. Vernon Prey instructions - no improvement call on monday

## 2010-12-30 ENCOUNTER — Telehealth: Payer: Self-pay | Admitting: Internal Medicine

## 2010-12-30 MED ORDER — DOXYCYCLINE HYCLATE 100 MG PO TABS: 100.0000 mg | ORAL_TABLET | Freq: Two times a day (BID) | ORAL | Status: AC

## 2010-12-30 NOTE — Telephone Encounter (Signed)
Pt called a week or so ago. She is in [place] with her children. She called because she thought she had sinus trouble. Was told what to take OTC, and that if it wasn't cleared up, she could get a Rx. It is not better. Please call.

## 2010-12-30 NOTE — Telephone Encounter (Signed)
Please advise - wants abx - otc x10 days - not helping

## 2010-12-30 NOTE — Telephone Encounter (Signed)
Doxycycline 100 mg  #14 one BID

## 2010-12-30 NOTE — Telephone Encounter (Signed)
Addended by: Duard Brady I on: 12/30/2010 01:23 PM   Modules accepted: Orders

## 2011-02-22 ENCOUNTER — Other Ambulatory Visit: Payer: Self-pay | Admitting: Internal Medicine

## 2011-03-05 ENCOUNTER — Other Ambulatory Visit: Payer: Self-pay | Admitting: Internal Medicine

## 2011-04-05 ENCOUNTER — Ambulatory Visit: Payer: PRIVATE HEALTH INSURANCE | Admitting: Internal Medicine

## 2011-04-21 ENCOUNTER — Other Ambulatory Visit: Payer: Self-pay | Admitting: Internal Medicine

## 2011-04-30 ENCOUNTER — Ambulatory Visit: Payer: PRIVATE HEALTH INSURANCE | Admitting: Internal Medicine

## 2011-05-03 ENCOUNTER — Ambulatory Visit (INDEPENDENT_AMBULATORY_CARE_PROVIDER_SITE_OTHER): Payer: PRIVATE HEALTH INSURANCE | Admitting: Internal Medicine

## 2011-05-03 ENCOUNTER — Encounter: Payer: Self-pay | Admitting: Internal Medicine

## 2011-05-03 ENCOUNTER — Ambulatory Visit: Payer: PRIVATE HEALTH INSURANCE | Admitting: Internal Medicine

## 2011-05-03 VITALS — BP 126/80 | Temp 98.2°F | Wt 112.0 lb

## 2011-05-03 DIAGNOSIS — E785 Hyperlipidemia, unspecified: Secondary | ICD-10-CM

## 2011-05-03 DIAGNOSIS — J449 Chronic obstructive pulmonary disease, unspecified: Secondary | ICD-10-CM

## 2011-05-03 DIAGNOSIS — J4489 Other specified chronic obstructive pulmonary disease: Secondary | ICD-10-CM

## 2011-05-03 DIAGNOSIS — J309 Allergic rhinitis, unspecified: Secondary | ICD-10-CM

## 2011-05-03 DIAGNOSIS — I10 Essential (primary) hypertension: Secondary | ICD-10-CM

## 2011-05-03 MED ORDER — FUROSEMIDE 20 MG PO TABS: 20.0000 mg | ORAL_TABLET | Freq: Every day | ORAL | Status: DC

## 2011-05-03 MED ORDER — FLUTICASONE PROPIONATE 50 MCG/ACT NA SUSP: 2.0000 | Freq: Every day | NASAL | Status: DC

## 2011-05-03 MED ORDER — TIOTROPIUM BROMIDE MONOHYDRATE 18 MCG IN CAPS: 18.0000 ug | ORAL_CAPSULE | Freq: Every day | RESPIRATORY_TRACT | Status: DC

## 2011-05-03 MED ORDER — BUDESONIDE-FORMOTEROL FUMARATE 160-4.5 MCG/ACT IN AERO: 2.0000 | INHALATION_SPRAY | Freq: Two times a day (BID) | RESPIRATORY_TRACT | Status: DC

## 2011-05-03 MED ORDER — SIMVASTATIN 40 MG PO TABS: 40.0000 mg | ORAL_TABLET | Freq: Every day | ORAL | Status: DC

## 2011-05-03 NOTE — Progress Notes (Signed)
  Subjective:    Patient ID: Cathy Hensley, female    DOB: [birth year], 71 y.o.   MRN: [id number]  HPI  71 year old patient who is seen today for her six-month followup. She has a history of advanced COPD. She monitors home oximetry readings and usually runs in the low 90s. She had required oxygen therapy in the past after an acute exacerbation of COPD. She remains on inhalational medications. In general she is doing quite well except for some nasal stuffiness. She has spent much time in [place] after the arrival of a grandchild. She states that she has been treated with prednisone and antibiotic therapy recently for a sinusitis. In general doing reasonably well today    Review of Systems  Constitutional: Negative.   HENT: Negative for hearing loss, congestion, sore throat, rhinorrhea, dental problem, sinus pressure and tinnitus.   Eyes: Negative for pain, discharge and visual disturbance.  Respiratory: Positive for shortness of breath. Negative for cough.   Cardiovascular: Negative for chest pain, palpitations and leg swelling.  Gastrointestinal: Negative for nausea, vomiting, abdominal pain, diarrhea, constipation, blood in stool and abdominal distention.  Genitourinary: Negative for dysuria, urgency, frequency, hematuria, flank pain, vaginal bleeding, vaginal discharge, difficulty urinating, vaginal pain and pelvic pain.  Musculoskeletal: Negative for joint swelling, arthralgias and gait problem.  Skin: Negative for rash.  Neurological: Negative for dizziness, syncope, speech difficulty, weakness, numbness and headaches.  Hematological: Negative for adenopathy.  Psychiatric/Behavioral: Negative for behavioral problems, dysphoric mood and agitation. The patient is not nervous/anxious.        Objective:   Physical Exam  Constitutional: She is oriented to person, place, and time. She appears well-developed and well-nourished.  HENT:  Head: Normocephalic.  Right Ear: External ear normal.  Left  Ear: External ear normal.  Mouth/Throat: Oropharynx is clear and moist.  Eyes: Conjunctivae and EOM are normal. Pupils are equal, round, and reactive to light.  Neck: Normal range of motion. Neck supple. No thyromegaly present.  Cardiovascular: Normal rate, regular rhythm, normal heart sounds and intact distal pulses.   Pulmonary/Chest: Effort normal.       Diminished breath sounds but clear  Abdominal: Soft. Bowel sounds are normal. She exhibits no mass. There is no tenderness.  Musculoskeletal: Normal range of motion.  Lymphadenopathy:    She has no cervical adenopathy.  Neurological: She is alert and oriented to person, place, and time.  Skin: Skin is warm and dry. No rash noted.  Psychiatric: She has a normal mood and affect. Her behavior is normal.          Assessment & Plan:   Advanced COPD Hypertension well controlled. We'll continue diuretic therapy Allergic rhinitis. We'll add fluticasone nasal spray Dyslipidemia. We'll check lipid profile next visit   Recheck 6 months

## 2011-05-03 NOTE — Patient Instructions (Signed)
Limit your sodium (Salt) intake  Return in 6 months for follow-up  

## 2011-11-05 ENCOUNTER — Ambulatory Visit (INDEPENDENT_AMBULATORY_CARE_PROVIDER_SITE_OTHER): Payer: PRIVATE HEALTH INSURANCE | Admitting: Internal Medicine

## 2011-11-05 ENCOUNTER — Encounter: Payer: Self-pay | Admitting: Internal Medicine

## 2011-11-05 VITALS — BP 132/80 | HR 80 | Temp 97.6°F | Resp 20 | Wt 106.0 lb

## 2011-11-05 DIAGNOSIS — I10 Essential (primary) hypertension: Secondary | ICD-10-CM

## 2011-11-05 DIAGNOSIS — Z0000 Encounter for general adult medical examination without abnormal findings: Secondary | ICD-10-CM

## 2011-11-05 DIAGNOSIS — E785 Hyperlipidemia, unspecified: Secondary | ICD-10-CM

## 2011-11-05 DIAGNOSIS — J4489 Other specified chronic obstructive pulmonary disease: Secondary | ICD-10-CM

## 2011-11-05 DIAGNOSIS — J449 Chronic obstructive pulmonary disease, unspecified: Secondary | ICD-10-CM

## 2011-11-05 DIAGNOSIS — E039 Hypothyroidism, unspecified: Secondary | ICD-10-CM

## 2011-11-05 LAB — COMPREHENSIVE METABOLIC PANEL
AST: 24 U/L (ref 0–37)
Albumin: 4 g/dL (ref 3.5–5.2)
Alkaline Phosphatase: 66 U/L (ref 39–117)
BUN: 15 mg/dL (ref 6–23)
Calcium: 9 mg/dL (ref 8.4–10.5)
Chloride: 103 mEq/L (ref 96–112)
Potassium: 4.7 mEq/L (ref 3.5–5.1)
Sodium: 143 mEq/L (ref 135–145)
Total Protein: 7 g/dL (ref 6.0–8.3)

## 2011-11-05 LAB — CBC WITH DIFFERENTIAL/PLATELET
Basophils Relative: 0.5 % (ref 0.0–3.0)
Eosinophils Absolute: 0.1 10*3/uL (ref 0.0–0.7)
Eosinophils Relative: 1.3 % (ref 0.0–5.0)
Lymphocytes Relative: 14.5 % (ref 12.0–46.0)
MCHC: 32 g/dL (ref 30.0–36.0)
Monocytes Absolute: 0.7 10*3/uL (ref 0.1–1.0)
Neutrophils Relative %: 74 % (ref 43.0–77.0)
Platelets: 276 10*3/uL (ref 150.0–400.0)
RBC: 4.52 Mil/uL (ref 3.87–5.11)
WBC: 7 10*3/uL (ref 4.5–10.5)

## 2011-11-05 LAB — LIPID PANEL
HDL: 63.5 mg/dL (ref >39.00)
LDL Cholesterol: 97 mg/dL (ref 0–99)
VLDL: 21 mg/dL (ref 0.0–40.0)

## 2011-11-05 MED ORDER — FLUTICASONE PROPIONATE 50 MCG/ACT NA SUSP: 2.0000 | Freq: Every day | NASAL | Status: DC

## 2011-11-05 MED ORDER — TIOTROPIUM BROMIDE MONOHYDRATE 18 MCG IN CAPS: 18.0000 ug | ORAL_CAPSULE | Freq: Every day | RESPIRATORY_TRACT | Status: DC

## 2011-11-05 MED ORDER — FUROSEMIDE 20 MG PO TABS: 20.0000 mg | ORAL_TABLET | Freq: Every day | ORAL | Status: DC

## 2011-11-05 MED ORDER — BUDESONIDE-FORMOTEROL FUMARATE 160-4.5 MCG/ACT IN AERO: 2.0000 | INHALATION_SPRAY | Freq: Two times a day (BID) | RESPIRATORY_TRACT | Status: DC

## 2011-11-05 MED ORDER — SIMVASTATIN 40 MG PO TABS: 40.0000 mg | ORAL_TABLET | Freq: Every day | ORAL | Status: DC

## 2011-11-05 NOTE — Progress Notes (Signed)
Subjective:    Patient ID: Cathy Hensley, female    DOB: Jun 01, 1940, 71 y.o.   MRN: [id number]  HPI  71 year old patient who is seen today for a preventive health examination. She has a history of moderately severe COPD but does quite well. She has dyslipidemia and a history of hypertension. Blood pressure presently is controlled with diuretic therapy only. No major concerns or complaints.  Allergies:  No Known Drug Allergies   Past History:  Past Medical History:  Reviewed history from 10/26/2006 and no changes required.  COPD  Hyperlipidemia  Hypertension   Past Surgical History:  Tonsillectomy  gravida two, para two, abortus zero  no prior colonoscopy   Family History:  Reviewed history from 10/26/2006 and no changes required.  father died in his 3s ethanol related complications  mother died age 28 through with after disease and abdominal aortic aneurysm  Three sisters one died of lymphoma  another done of the cerebral aneurysm  another sister with a history of abdominal aneurysm   Social History:  Reviewed history from 10/26/2006 and no changes required.  relocated from [place]  discontinued tobacco, 2003   Past Medical History  Diagnosis Date  . COPD 07/13/2006  . HYPERTENSION 07/13/2006  . MENOPAUSAL SYNDROME 07/13/2006  . Other and unspecified hyperlipidemia 07/13/2006    History   Social History  . Marital Status: Married    Spouse Name: N/A    Number of Children: N/A  . Years of Education: N/A   Occupational History  . Not on file.   Social History Main Topics  . Smoking status: Former Games developer  . Smokeless tobacco: Former Neurosurgeon    Quit date: 01/05/2004  . Alcohol Use: Not on file  . Drug Use: Not on file  . Sexually Active: Not on file   Other Topics Concern  . Not on file   Social History Narrative  . No narrative on file    Past Surgical History  Procedure Date  . Tonsillectomy     No family history on file.  No Known Allergies  Current  Outpatient Prescriptions on File Prior to Visit  Medication Sig Dispense Refill  . budesonide-formoterol (SYMBICORT) 160-4.5 MCG/ACT inhaler Inhale 2 puffs into the lungs 2 (two) times daily.  10.2 g  7  . fluticasone (FLONASE) 50 MCG/ACT nasal spray Place 2 sprays into the nose daily.  16 g  6  . furosemide (LASIX) 20 MG tablet Take 1 tablet (20 mg total) by mouth daily.  90 tablet  3  . NON FORMULARY O2 - 2-3 L/m       . simvastatin (ZOCOR) 40 MG tablet Take 1 tablet (40 mg total) by mouth at bedtime.  90 tablet  3  . tiotropium (SPIRIVA HANDIHALER) 18 MCG inhalation capsule Place 1 capsule (18 mcg total) into inhaler and inhale daily.  30 capsule  6    BP 132/80  Pulse 80  Temp 97.6 F (36.4 C) (Oral)  Resp 20  Wt 106 lb (48.081 kg)  SpO2 90%  1. Risk factors, based on past  M,S,F history-    cardiovascular risk factors include hypertension and dyslipidemia 2.  Physical activities: No exercise limitations except for mild dyspnea on exertion related to her COPD  3.  Depression/mood: No history depression or mood disorder  4.  Hearing: No significant deficits  5.  ADL's: Independent in all aspects of daily living  6.  Fall risk: Low  7.  Home safety: No problems identified  8.  Height weight, and visual acuity; height and weight stable no change in visual acuity  9.  Counseling: Colonoscopy urged  10. Lab orders based on risk factors: Laboratory update including lipid profile will be reviewed 11. Referral : Mammogram and colonoscopy recommended  12. Care plan: Followup 6 months  13. Cognitive assessment: Alert and oriented with normal affect. No cognitive dysfunction    Review of Systems  Constitutional: Negative for fever, appetite change, fatigue and unexpected weight change.  HENT: Negative for hearing loss, ear pain, nosebleeds, congestion, sore throat, mouth sores, trouble swallowing, neck stiffness, dental problem, voice change, sinus pressure and tinnitus.     Eyes: Negative for photophobia, pain, redness and visual disturbance.  Respiratory: Positive for shortness of breath. Negative for cough and chest tightness.   Cardiovascular: Negative for chest pain, palpitations and leg swelling.  Gastrointestinal: Negative for nausea, vomiting, abdominal pain, diarrhea, constipation, blood in stool, abdominal distention and rectal pain.  Genitourinary: Negative for dysuria, urgency, frequency, hematuria, flank pain, vaginal bleeding, vaginal discharge, difficulty urinating, genital sores, vaginal pain, menstrual problem and pelvic pain.  Musculoskeletal: Negative for back pain and arthralgias.  Skin: Negative for rash.  Neurological: Negative for dizziness, syncope, speech difficulty, weakness, light-headedness, numbness and headaches.  Hematological: Negative for adenopathy. Does not bruise/bleed easily.  Psychiatric/Behavioral: Negative for suicidal ideas, behavioral problems, self-injury, dysphoric mood and agitation. The patient is not nervous/anxious.        Objective:   Physical Exam  Constitutional: She is oriented to person, place, and time. She appears well-developed and well-nourished.  HENT:  Head: Normocephalic and atraumatic.  Right Ear: External ear normal.  Left Ear: External ear normal.  Mouth/Throat: Oropharynx is clear and moist.  Eyes: Conjunctivae normal and EOM are normal.  Neck: Normal range of motion. Neck supple. No JVD present. No thyromegaly present.  Cardiovascular: Normal rate, regular rhythm, normal heart sounds and intact distal pulses.   No murmur heard. Pulmonary/Chest: Effort normal and breath sounds normal. She has no wheezes. She has no rales.  Abdominal: Soft. Bowel sounds are normal. She exhibits no distension and no mass. There is no tenderness. There is no rebound and no guarding.  Musculoskeletal: Normal range of motion. She exhibits no edema and no tenderness.  Neurological: She is alert and oriented to  person, place, and time. She has normal reflexes. No cranial nerve deficit. She exhibits normal muscle tone. Coordination normal.  Skin: Skin is warm and dry. No rash noted.  Psychiatric: She has a normal mood and affect. Her behavior is normal.          Assessment & Plan:   Preventive Health exam COPD HTN HLD  Lab update 6 mon f/u

## 2011-11-05 NOTE — Patient Instructions (Addendum)
Limit your sodium (Salt) intake  Please check your blood pressure on a regular basis.  If it is consistently greater than 150/90, please make an office appointment.  Schedule your colonoscopy to help detect colon cancer.  Return in 6 months for follow-up  Take a calcium supplement, plus 800-1200 units of vitamin D 

## 2011-11-09 ENCOUNTER — Telehealth: Payer: Self-pay | Admitting: Internal Medicine

## 2011-11-09 NOTE — Telephone Encounter (Signed)
Spoke with pt- we need to see - do a face to face - document sats 88% or lower on rm air before we can order - standards changed as of Oct 1 , instructed to call [facility] in [place] and see if there is any way around regulations to be able to get her O2 while there - call and let me know and we will try to work it.

## 2011-11-09 NOTE — Telephone Encounter (Signed)
Caller: [name]/Patient; Phone: [phone]; Reason for Call: Patient calling regarding COPD and states she went to Urgent Care in [place] today and they recommended her to get oxygen and she wants to know if Dr Kirtland Bouchard will write her a Rx for oxygen and send it to [facility] like last time.  Also provided number to urgent care where she was seen [phone].

## 2011-11-10 ENCOUNTER — Telehealth: Payer: Self-pay | Admitting: Internal Medicine

## 2011-11-10 NOTE — Telephone Encounter (Signed)
Caller: Cathy Hensley/Patient; Phone: [phone]; Reason for Call: Calling to advise Dr Amador Cunas that she was seen at [facility] in [place], [place] [phone]) on 11/5.  They have recommened the patient be started on oxygen.  Patient is faxing her discharge sheet to the office (she is still in [place]).  Mrs Dia is wanting to be sure that Dr Amador Cunas would be willing to accept the information from this other MD to give orders for oxygen. Please call today to discuss this situation with Mrs Thayne.

## 2011-11-11 NOTE — Telephone Encounter (Signed)
Spoke with pt- urgent care was able to get her started on O2 thru [facility] - and should be able to get it transferred here when she returns home - is doing well now.

## 2011-11-15 ENCOUNTER — Ambulatory Visit (INDEPENDENT_AMBULATORY_CARE_PROVIDER_SITE_OTHER): Payer: PRIVATE HEALTH INSURANCE | Admitting: Internal Medicine

## 2011-11-15 ENCOUNTER — Encounter: Payer: Self-pay | Admitting: Internal Medicine

## 2011-11-15 VITALS — BP 150/82 | HR 111 | Temp 98.3°F | Resp 16 | Wt 106.0 lb

## 2011-11-15 DIAGNOSIS — J449 Chronic obstructive pulmonary disease, unspecified: Secondary | ICD-10-CM

## 2011-11-15 DIAGNOSIS — I10 Essential (primary) hypertension: Secondary | ICD-10-CM

## 2011-11-15 DIAGNOSIS — J4489 Other specified chronic obstructive pulmonary disease: Secondary | ICD-10-CM

## 2011-11-15 NOTE — Progress Notes (Signed)
Subjective:    Patient ID: Cathy Hensley, female    DOB: 02/04/1940, 71 y.o.   MRN: [id number]  HPI  71 year old patient who has a history of advanced COPD. She was treated in [place] about one week ago and has completed a prednisone Dosepak and Levaquin antibiotic therapy. She remains weak but improved. She remains on maximal inhalational medications. She was resumed on oxygen therapy due 2 resting hypoxemia with O2 saturations as low as 78. Denies any cough or wheezing. Her chief complaint is weakness.  Past Medical History  Diagnosis Date  . COPD 07/13/2006  . HYPERTENSION 07/13/2006  . MENOPAUSAL SYNDROME 07/13/2006  . Other and unspecified hyperlipidemia 07/13/2006    History   Social History  . Marital Status: Married    Spouse Name: N/A    Number of Children: N/A  . Years of Education: N/A   Occupational History  . Not on file.   Social History Main Topics  . Smoking status: Former Games developer  . Smokeless tobacco: Former Neurosurgeon    Quit date: 01/05/2004  . Alcohol Use: Not on file  . Drug Use: Not on file  . Sexually Active: Not on file   Other Topics Concern  . Not on file   Social History Narrative  . No narrative on file    Past Surgical History  Procedure Date  . Tonsillectomy     No family history on file.  No Known Allergies  Current Outpatient Prescriptions on File Prior to Visit  Medication Sig Dispense Refill  . budesonide-formoterol (SYMBICORT) 160-4.5 MCG/ACT inhaler Inhale 2 puffs into the lungs 2 (two) times daily.  10.2 g  7  . fluticasone (FLONASE) 50 MCG/ACT nasal spray Place 2 sprays into the nose daily.  16 g  6  . furosemide (LASIX) 20 MG tablet Take 1 tablet (20 mg total) by mouth daily.  90 tablet  3  . NON FORMULARY O2 - 2-3 L/m       . simvastatin (ZOCOR) 40 MG tablet Take 1 tablet (40 mg total) by mouth at bedtime.  90 tablet  3  . tiotropium (SPIRIVA HANDIHALER) 18 MCG inhalation capsule Place 1 capsule (18 mcg total) into inhaler and inhale daily.   30 capsule  6    BP 150/82  Pulse 111  Temp 98.3 F (36.8 C) (Oral)  Resp 16  Wt 106 lb (48.081 kg)  SpO2 89%       Review of Systems  Constitutional: Positive for fatigue.  HENT: Negative for hearing loss, congestion, sore throat, rhinorrhea, dental problem, sinus pressure and tinnitus.   Eyes: Negative for pain, discharge and visual disturbance.  Respiratory: Positive for shortness of breath. Negative for cough.   Cardiovascular: Negative for chest pain, palpitations and leg swelling.  Gastrointestinal: Negative for nausea, vomiting, abdominal pain, diarrhea, constipation, blood in stool and abdominal distention.  Genitourinary: Negative for dysuria, urgency, frequency, hematuria, flank pain, vaginal bleeding, vaginal discharge, difficulty urinating, vaginal pain and pelvic pain.  Musculoskeletal: Negative for joint swelling, arthralgias and gait problem.  Skin: Negative for rash.  Neurological: Positive for weakness. Negative for dizziness, syncope, speech difficulty, numbness and headaches.  Hematological: Negative for adenopathy.  Psychiatric/Behavioral: Negative for behavioral problems, dysphoric mood and agitation. The patient is not nervous/anxious.        Objective:   Physical Exam  Constitutional: She is oriented to person, place, and time. She appears well-developed and well-nourished.  HENT:  Head: Normocephalic.  Right Ear: External ear normal.  Left  Ear: External ear normal.  Mouth/Throat: Oropharynx is clear and moist.  Eyes: Conjunctivae normal and EOM are normal. Pupils are equal, round, and reactive to light.  Neck: Normal range of motion. Neck supple. No thyromegaly present.  Cardiovascular: Normal rate, regular rhythm, normal heart sounds and intact distal pulses.   Pulmonary/Chest: Effort normal and breath sounds normal. No respiratory distress. She has no wheezes. She has no rales.       O2 saturation 90-91 on 2 L of oxygen per minute  Abdominal:  Soft. Bowel sounds are normal. She exhibits no mass. There is no tenderness.  Musculoskeletal: Normal range of motion.  Lymphadenopathy:    She has no cervical adenopathy.  Neurological: She is alert and oriented to person, place, and time.  Skin: Skin is warm and dry. No rash noted.  Psychiatric: She has a normal mood and affect. Her behavior is normal.          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 The patient is agreeable to continue chronic oxygen therapy. She will complete antibiotic therapy Hypertension stable  Recheck as scheduled in 3 months or as needed

## 2011-11-15 NOTE — Patient Instructions (Signed)
Continue oxygen therapy at 2 L of oxygen per minute Return in 3 months for followup or as needed

## 2012-03-08 ENCOUNTER — Ambulatory Visit (INDEPENDENT_AMBULATORY_CARE_PROVIDER_SITE_OTHER): Payer: PRIVATE HEALTH INSURANCE | Admitting: Internal Medicine

## 2012-03-08 ENCOUNTER — Encounter: Payer: Self-pay | Admitting: Internal Medicine

## 2012-03-08 VITALS — BP 126/70 | HR 92 | Temp 97.6°F | Resp 20 | Wt 110.0 lb

## 2012-03-08 DIAGNOSIS — J449 Chronic obstructive pulmonary disease, unspecified: Secondary | ICD-10-CM

## 2012-03-08 NOTE — Patient Instructions (Addendum)
Acute bronchitis symptoms for less than 10 days are generally not helped by antibiotics.  Take over-the-counter expectorants and cough medications such as  Mucinex DM.  Call if there is no improvement in 5 to 7 days or if he developed worsening cough, fever, or new symptoms, such as shortness of breath or chest pain.    

## 2012-03-08 NOTE — Progress Notes (Signed)
Subjective:    Patient ID: Cathy Hensley, female    DOB: [birth year], 72 y.o.   MRN: [id number]  HPI  72 year old patient who has a history of advanced oxygen-dependent COPD. She has hypertension and dyslipidemia. For the past several days she has had some increasing cough headache and chest congestion. She has had some fatigue especially towards the end of the day cough is minimally productive. Denies any fever chills wheezing or significant shortness of breath different from her baseline. No increased oxygen requirement  Past Medical History  Diagnosis Date  . COPD 07/13/2006  . HYPERTENSION 07/13/2006  . MENOPAUSAL SYNDROME 07/13/2006  . Other and unspecified hyperlipidemia 07/13/2006    History   Social History  . Marital Status: Married    Spouse Name: N/A    Number of Children: N/A  . Years of Education: N/A   Occupational History  . Not on file.   Social History Main Topics  . Smoking status: Former Games developer  . Smokeless tobacco: Former Neurosurgeon    Quit date: 01/05/2004  . Alcohol Use: Not on file  . Drug Use: Not on file  . Sexually Active: Not on file   Other Topics Concern  . Not on file   Social History Narrative  . No narrative on file    Past Surgical History  Procedure Laterality Date  . Tonsillectomy      No family history on file.  No Known Allergies  Current Outpatient Prescriptions on File Prior to Visit  Medication Sig Dispense Refill  . budesonide-formoterol (SYMBICORT) 160-4.5 MCG/ACT inhaler Inhale 2 puffs into the lungs 2 (two) times daily.  10.2 g  7  . fluticasone (FLONASE) 50 MCG/ACT nasal spray Place 2 sprays into the nose daily.  16 g  6  . furosemide (LASIX) 20 MG tablet Take 1 tablet (20 mg total) by mouth daily.  90 tablet  3  . NON FORMULARY O2 - 2-3 L/m       . simvastatin (ZOCOR) 40 MG tablet Take 1 tablet (40 mg total) by mouth at bedtime.  90 tablet  3  . tiotropium (SPIRIVA HANDIHALER) 18 MCG inhalation capsule Place 1 capsule (18 mcg total)  into inhaler and inhale daily.  30 capsule  6   No current facility-administered medications on file prior to visit.    BP 126/70  Pulse 92  Temp(Src) 97.6 F (36.4 C) (Oral)  Resp 20  Wt 110 lb (49.896 kg)  BMI 18.31 kg/m2  SpO2 89%       Review of Systems  Constitutional: Positive for fatigue.  HENT: Positive for nosebleeds. Negative for hearing loss, congestion, sore throat, rhinorrhea, dental problem, sinus pressure and tinnitus.   Eyes: Negative for pain, discharge and visual disturbance.  Respiratory: Positive for cough. Negative for shortness of breath.   Cardiovascular: Negative for chest pain, palpitations and leg swelling.  Gastrointestinal: Negative for nausea, vomiting, abdominal pain, diarrhea, constipation, blood in stool and abdominal distention.  Genitourinary: Negative for dysuria, urgency, frequency, hematuria, flank pain, vaginal bleeding, vaginal discharge, difficulty urinating, vaginal pain and pelvic pain.  Musculoskeletal: Negative for joint swelling, arthralgias and gait problem.  Skin: Negative for rash.  Neurological: Negative for dizziness, syncope, speech difficulty, weakness, numbness and headaches.  Hematological: Negative for adenopathy.  Psychiatric/Behavioral: Negative for behavioral problems, dysphoric mood and agitation. The patient is not nervous/anxious.        Objective:   Physical Exam  Constitutional: She is oriented to person, place, and time. She appears  well-developed and well-nourished.  Elderly frail no acute distress. Nasal cannula oxygen in place  HENT:  Head: Normocephalic.  Right Ear: External ear normal.  Left Ear: External ear normal.  Mouth/Throat: Oropharynx is clear and moist.  Eyes: Conjunctivae and EOM are normal. Pupils are equal, round, and reactive to light.  Neck: Normal range of motion. Neck supple. No thyromegaly present.  Cardiovascular: Normal rate, regular rhythm, normal heart sounds and intact distal  pulses.   No tachycardia  Pulmonary/Chest: Effort normal and breath sounds normal.  Faint scattered rhonchi Diminished breath sounds O2 saturation 90%   Abdominal: Soft. Bowel sounds are normal. She exhibits no mass. There is no tenderness.  Musculoskeletal: Normal range of motion.  Lymphadenopathy:    She has no cervical adenopathy.  Neurological: She is alert and oriented to person, place, and time.  Skin: Skin is warm and dry. No rash noted.  Psychiatric: She has a normal mood and affect. Her behavior is normal.          Assessment & Plan:   COPD URI Hypertension  We'll continue fluids expectorants and maintenance inhalational medication. Will hold antibiotic therapy at this time. However the patient was given samples of Avelox which she will take if she develops fever purulent productive cough or any clinical worsening

## 2012-06-13 ENCOUNTER — Telehealth: Payer: Self-pay | Admitting: Internal Medicine

## 2012-06-13 NOTE — Telephone Encounter (Signed)
Patient Information:  Caller Name: Corrie Dandy  Phone: [phone]  Patient: Cathy Hensley, Cathy Hensley  Gender: Female  DOB: 10/30/1940  Age: 72 Years  PCP: Eleonore Chiquito [facility])  Office Follow Up:  Does the office need to follow up with this patient?: Yes  Instructions For The Office: Please call pt and advise.  RN Note:  Pt states she feels great on the meds and she is on 24 hour continuous 02 at 3l/min  Symptoms  Reason For Call & Symptoms: Pt has switched to Medicare/[employer]. As of July she will have changes in her coverage due to this. Until now she has been paying 45$ as a co-pay for her meds. She now will have to pay 170.00$ per inhaler.  She is calling to ask Dr. Kirtland Bouchard. if she can change Spireva and Symbacort to [employer] and Pulmicort  or something generic .  Reviewed Health History In EMR: Yes  Reviewed Medications In EMR: Yes  Reviewed Allergies In EMR: Yes  Reviewed Surgeries / Procedures: Yes  Date of Onset of Symptoms: 06/13/2012  Guideline(s) Used:  No Protocol Available - Information Only  Disposition Per Guideline:   Discuss with PCP and Callback by Nurse Today  Reason For Disposition Reached:   Nursing judgment  Advice Given:  N/A  Patient Will Follow Care Advice:  YES

## 2012-06-14 NOTE — Telephone Encounter (Signed)
No generics available but can change prescription to nebulized medications

## 2012-06-14 NOTE — Telephone Encounter (Signed)
Spoke to pt told her there is no generics but can change prescription to nebulized medications. Pt verbalized understanding and stated she has a nebulizer and uses [facility]. Told pt okay I have a form here for [facility] will fill out and fax over to them. Pt verbalized understanding.

## 2012-06-16 NOTE — Telephone Encounter (Signed)
Spoke to pt told her faxing order to [facility] for nebulizer medications. Pt verbalized understanding.

## 2012-06-20 ENCOUNTER — Telehealth: Payer: Self-pay | Admitting: Internal Medicine

## 2012-06-20 NOTE — Telephone Encounter (Signed)
Nurse is calling in regards to the pt's nebulizer medication. Nurse is requesting an office note that mentioned the budesonide. BID via neb. The electronic fax is [phone]. Please assist.

## 2012-06-20 NOTE — Telephone Encounter (Signed)
Left Mandy a message to call me regarding pt. [phone]

## 2012-06-21 NOTE — Telephone Encounter (Signed)
Angelica Chessman called back stating she needs a note stating why pt needs nebulizer medications. Told Angelica Chessman will do note tomorrow and fax over. Mandy verbalized understanding.

## 2012-06-22 NOTE — Telephone Encounter (Signed)
Letter faxed to [facility].

## 2012-07-11 ENCOUNTER — Encounter: Payer: Self-pay | Admitting: Internal Medicine

## 2012-07-11 ENCOUNTER — Ambulatory Visit (INDEPENDENT_AMBULATORY_CARE_PROVIDER_SITE_OTHER): Payer: Medicare Other | Admitting: Internal Medicine

## 2012-07-11 VITALS — BP 130/70 | HR 83 | Temp 98.2°F | Resp 20 | Wt 110.0 lb

## 2012-07-11 DIAGNOSIS — E785 Hyperlipidemia, unspecified: Secondary | ICD-10-CM

## 2012-07-11 DIAGNOSIS — I10 Essential (primary) hypertension: Secondary | ICD-10-CM

## 2012-07-11 DIAGNOSIS — J449 Chronic obstructive pulmonary disease, unspecified: Secondary | ICD-10-CM

## 2012-07-11 MED ORDER — BUDESONIDE 0.5 MG/2ML IN SUSP: 0.5000 mg | Freq: Two times a day (BID) | RESPIRATORY_TRACT | Status: DC

## 2012-07-11 NOTE — Progress Notes (Signed)
Subjective:    Patient ID: Cathy Hensley, female    DOB: 1940-11-18, 72 y.o.   MRN: [id number]  HPI  72 year old patient who is seen today for followup of COPD. This has been quite stable on her present inhalational medications. Presently she is on Symbicort and Spiriva. Unfortunately she has entered the "donut hole"in her monthly cost of each medication will increase from $45 monthly to $160 monthly until the end of the year. She states that she is unable to afford this Price increase.  She wishes to switch to budesonide via nebulizer treatment do to these cost considerations. Her pulmonary status has been quite stable. In fact last week she spent the day at the [facility] and did quite well. She has treated hypertension which has been well-controlled on diuretic therapy. She remains on simvastatin for dyslipidemia. She denies any cardiopulmonary complaints  Past Medical History  Diagnosis Date  . COPD 07/13/2006  . HYPERTENSION 07/13/2006  . MENOPAUSAL SYNDROME 07/13/2006  . Other and unspecified hyperlipidemia 07/13/2006    History   Social History  . Marital Status: Married    Spouse Name: N/A    Number of Children: N/A  . Years of Education: N/A   Occupational History  . Not on file.   Social History Main Topics  . Smoking status: Former Games developer  . Smokeless tobacco: Former Neurosurgeon    Quit date: 01/05/2004  . Alcohol Use: Not on file  . Drug Use: Not on file  . Sexually Active: Not on file   Other Topics Concern  . Not on file   Social History Narrative  . No narrative on file    Past Surgical History  Procedure Laterality Date  . Tonsillectomy      No family history on file.  No Known Allergies  Current Outpatient Prescriptions on File Prior to Visit  Medication Sig Dispense Refill  . budesonide-formoterol (SYMBICORT) 160-4.5 MCG/ACT inhaler Inhale 2 puffs into the lungs 2 (two) times daily.  10.2 g  7  . fluticasone (FLONASE) 50 MCG/ACT nasal spray Place 2 sprays into  the nose daily.  16 g  6  . furosemide (LASIX) 20 MG tablet Take 1 tablet (20 mg total) by mouth daily.  90 tablet  3  . NON FORMULARY O2 - 2-3 L/m       . simvastatin (ZOCOR) 40 MG tablet Take 1 tablet (40 mg total) by mouth at bedtime.  90 tablet  3  . tiotropium (SPIRIVA HANDIHALER) 18 MCG inhalation capsule Place 1 capsule (18 mcg total) into inhaler and inhale daily.  30 capsule  6   No current facility-administered medications on file prior to visit.    BP 130/70  Pulse 83  Temp(Src) 98.2 F (36.8 C) (Oral)  Resp 20  Wt 110 lb (49.896 kg)  BMI 18.31 kg/m2  SpO2 96%       Review of Systems  Constitutional: Negative.   HENT: Negative for hearing loss, congestion, sore throat, rhinorrhea, dental problem, sinus pressure and tinnitus.   Eyes: Negative for pain, discharge and visual disturbance.  Respiratory: Negative for cough and shortness of breath.   Cardiovascular: Negative for chest pain, palpitations and leg swelling.  Gastrointestinal: Negative for nausea, vomiting, abdominal pain, diarrhea, constipation, blood in stool and abdominal distention.  Genitourinary: Negative for dysuria, urgency, frequency, hematuria, flank pain, vaginal bleeding, vaginal discharge, difficulty urinating, vaginal pain and pelvic pain.  Musculoskeletal: Negative for joint swelling, arthralgias and gait problem.  Skin: Negative for rash.  Neurological: Negative for dizziness, syncope, speech difficulty, weakness, numbness and headaches.  Hematological: Negative for adenopathy.  Psychiatric/Behavioral: Negative for behavioral problems, dysphoric mood and agitation. The patient is not nervous/anxious.        Objective:   Physical Exam  Constitutional: She is oriented to person, place, and time. She appears well-developed and well-nourished.  Elderly thin appears well Blood pressure 122/72 Weight 110  Nasal cannula oxygen in place  HENT:  Head: Normocephalic.  Right Ear: External ear  normal.  Left Ear: External ear normal.  Mouth/Throat: Oropharynx is clear and moist.  Eyes: Conjunctivae and EOM are normal. Pupils are equal, round, and reactive to light.  Neck: Normal range of motion. Neck supple. No thyromegaly present.  Cardiovascular: Normal rate, regular rhythm, normal heart sounds and intact distal pulses.   Pulmonary/Chest: Effort normal and breath sounds normal. No respiratory distress. She has no wheezes. She has no rales.  Breath sounds and mildly decreased but clear O2 saturation 96% on 3 L of supplemental oxygen per minute  Abdominal: Soft. Bowel sounds are normal. She exhibits no mass. There is no tenderness.  Musculoskeletal: Normal range of motion. She exhibits no edema.  Lymphadenopathy:    She has no cervical adenopathy.  Neurological: She is alert and oriented to person, place, and time.  Skin: Skin is warm and dry. No rash noted.  Psychiatric: She has a normal mood and affect. Her behavior is normal.          Assessment & Plan:   Advanced oxygen-dependent COPD. Due to cost concerns will switch to budesonide via inhalation. Samples of her MDI dispensed. Hypertension stable Dyslipidemia stable  Recheck 6 months

## 2012-07-11 NOTE — Patient Instructions (Signed)
Return in 6 months for follow-up  Limit your sodium (Salt) intake  Please check your blood pressure on a regular basis.  If it is consistently greater than 150/90, please make an office appointment.

## 2012-07-14 ENCOUNTER — Telehealth: Payer: Self-pay | Admitting: Internal Medicine

## 2012-07-14 NOTE — Telephone Encounter (Signed)
Pt would like donna to return her call concerning pulmicort

## 2012-07-17 NOTE — Telephone Encounter (Signed)
Spoke to pt she said [facility] called her and that her Pulmicort Rx was ready and she wanted to know if she was suppose to pick it up because she gets her meds from [facility]. Told pt no it was sent by mistake do not need to pick up. Pt verbalized understanding.

## 2012-12-04 ENCOUNTER — Other Ambulatory Visit: Payer: Self-pay | Admitting: Internal Medicine

## 2013-01-19 ENCOUNTER — Ambulatory Visit (INDEPENDENT_AMBULATORY_CARE_PROVIDER_SITE_OTHER): Payer: Medicare Other | Admitting: Internal Medicine

## 2013-01-19 ENCOUNTER — Encounter: Payer: Self-pay | Admitting: Internal Medicine

## 2013-01-19 VITALS — BP 140/80 | HR 80 | Temp 98.0°F | Resp 20 | Ht 65.0 in | Wt 118.0 lb

## 2013-01-19 DIAGNOSIS — M25519 Pain in unspecified shoulder: Secondary | ICD-10-CM

## 2013-01-19 DIAGNOSIS — J449 Chronic obstructive pulmonary disease, unspecified: Secondary | ICD-10-CM

## 2013-01-19 DIAGNOSIS — E785 Hyperlipidemia, unspecified: Secondary | ICD-10-CM

## 2013-01-19 DIAGNOSIS — I10 Essential (primary) hypertension: Secondary | ICD-10-CM

## 2013-01-19 DIAGNOSIS — M25511 Pain in right shoulder: Secondary | ICD-10-CM

## 2013-01-19 DIAGNOSIS — Z23 Encounter for immunization: Secondary | ICD-10-CM

## 2013-01-19 MED ORDER — FUROSEMIDE 20 MG PO TABS: 20.0000 mg | ORAL_TABLET | Freq: Every day | ORAL | Status: DC

## 2013-01-19 MED ORDER — FLUTICASONE PROPIONATE 50 MCG/ACT NA SUSP: 2.0000 | Freq: Every day | NASAL | Status: DC

## 2013-01-19 MED ORDER — SIMVASTATIN 40 MG PO TABS: ORAL_TABLET | ORAL | Status: DC

## 2013-01-19 MED ORDER — METHYLPREDNISOLONE ACETATE 80 MG/ML IJ SUSP
80.0000 mg | Freq: Once | INTRAMUSCULAR | Status: AC
  Administered 2013-01-19: 80 mg via INTRAMUSCULAR

## 2013-01-19 NOTE — Patient Instructions (Signed)
Call or return to clinic prn if these symptoms worsen or fail to improve as anticipated.  Impingement Syndrome, Rotator Cuff, Bursitis with Rehab Impingement syndrome is a condition that involves inflammation of the tendons of the rotator cuff and the subacromial bursa, that causes pain in the shoulder. The rotator cuff consists of four tendons and muscles that control much of the shoulder and upper arm function. The subacromial bursa is a fluid filled sac that helps reduce friction between the rotator cuff and one of the bones of the shoulder (acromion). Impingement syndrome is usually an overuse injury that causes swelling of the bursa (bursitis), swelling of the tendon (tendonitis), and/or a tear of the tendon (strain). Strains are classified into three categories. Grade 1 strains cause pain, but the tendon is not lengthened. Grade 2 strains include a lengthened ligament, due to the ligament being stretched or partially ruptured. With grade 2 strains there is still function, although the function may be decreased. Grade 3 strains include a complete tear of the tendon or muscle, and function is usually impaired. SYMPTOMS   Pain around the shoulder, often at the outer portion of the upper arm.  Pain that gets worse with shoulder function, especially when reaching overhead or lifting.  Sometimes, aching when not using the arm.  Pain that wakes you up at night.  Sometimes, tenderness, swelling, warmth, or redness over the affected area.  Loss of strength.  Limited motion of the shoulder, especially reaching behind the back (to the back pocket or to unhook bra) or across your body.  Crackling sound (crepitation) when moving the arm.  Biceps tendon pain and inflammation (in the front of the shoulder). Worse when bending the elbow or lifting. CAUSES  Impingement syndrome is often an overuse injury, in which chronic (repetitive) motions cause the tendons or bursa to become inflamed. A strain  occurs when a force is paced on the tendon or muscle that is greater than it can withstand. Common mechanisms of injury include: Stress from sudden increase in duration, frequency, or intensity of training.  Direct hit (trauma) to the shoulder.  Aging, erosion of the tendon with normal use.  Bony bump on shoulder (acromial spur). RISK INCREASES WITH:  Contact sports (football, wrestling, boxing).  Throwing sports (baseball, tennis, volleyball).  Weightlifting and bodybuilding.  Heavy labor.  Previous injury to the rotator cuff, including impingement.  Poor shoulder strength and flexibility.  Failure to warm up properly before activity.  Inadequate protective equipment.  Old age.  Bony bump on shoulder (acromial spur). PREVENTION   Warm up and stretch properly before activity.  Allow for adequate recovery between workouts.  Maintain physical fitness:  Strength, flexibility, and endurance.  Cardiovascular fitness.  Learn and use proper exercise technique. PROGNOSIS  If treated properly, impingement syndrome usually goes away within 6 weeks. Sometimes surgery is required.  RELATED COMPLICATIONS   Longer healing time if not properly treated, or if not given enough time to heal.  Recurring symptoms, that result in a chronic condition.  Shoulder stiffness, frozen shoulder, or loss of motion.  Rotator cuff tendon tear.  Recurring symptoms, especially if activity is resumed too soon, with overuse, with a direct blow, or when using poor technique. TREATMENT  Treatment first involves the use of ice and medicine, to reduce pain and inflammation. The use of strengthening and stretching exercises may help reduce pain with activity. These exercises may be performed at home or with a therapist. If non-surgical treatment is unsuccessful after more than   6 months, surgery may be advised. After surgery and rehabilitation, activity is usually possible in 3 months.   MEDICATION  If pain medicine is needed, nonsteroidal anti-inflammatory medicines (aspirin and ibuprofen), or other minor pain relievers (acetaminophen), are often advised.  Do not take pain medicine for 7 days before surgery.  Prescription pain relievers may be given, if your caregiver thinks they are needed. Use only as directed and only as much as you need.  Corticosteroid injections may be given by your caregiver. These injections should be reserved for the most serious cases, because they may only be given a certain number of times. HEAT AND COLD  Cold treatment (icing) should be applied for 10 to 15 minutes every 2 to 3 hours for inflammation and pain, and immediately after activity that aggravates your symptoms. Use ice packs or an ice massage.  Heat treatment may be used before performing stretching and strengthening activities prescribed by your caregiver, physical therapist, or athletic trainer. Use a heat pack or a warm water soak. SEEK MEDICAL CARE IF:   Symptoms get worse or do not improve in 4 to 6 weeks, despite treatment.  New, unexplained symptoms develop. (Drugs used in treatment may produce side effects.) EXERCISES  RANGE OF MOTION (ROM) AND STRETCHING EXERCISES - Impingement Syndrome (Rotator Cuff  Tendinitis, Bursitis) These exercises may help you when beginning to rehabilitate your injury. Your symptoms may go away with or without further involvement from your physician, physical therapist or athletic trainer. While completing these exercises, remember:   Restoring tissue flexibility helps normal motion to return to the joints. This allows healthier, less painful movement and activity.  An effective stretch should be held for at least 30 seconds.  A stretch should never be painful. You should only feel a gentle lengthening or release in the stretched tissue. STRETCH  Flexion, Standing  Stand with good posture. With an underhand grip on your right / left hand, and  an overhand grip on the opposite hand, grasp a broomstick or cane so that your hands are a little more than shoulder width apart.  Keeping your right / left elbow straight and shoulder muscles relaxed, push the stick with your opposite hand, to raise your right / left arm in front of your body and then overhead. Raise your arm until you feel a stretch in your right / left shoulder, but before you have increased shoulder pain.  Try to avoid shrugging your right / left shoulder as your arm rises, by keeping your shoulder blade tucked down and toward your mid-back spine. Hold for __________ seconds.  Slowly return to the starting position. Repeat __________ times. Complete this exercise __________ times per day. STRETCH  Abduction, Supine  Lie on your back. With an underhand grip on your right / left hand and an overhand grip on the opposite hand, grasp a broomstick or cane so that your hands are a little more than shoulder width apart.  Keeping your right / left elbow straight and your shoulder muscles relaxed, push the stick with your opposite hand, to raise your right / left arm out to the side of your body and then overhead. Raise your arm until you feel a stretch in your right / left shoulder, but before you have increased shoulder pain.  Try to avoid shrugging your right / left shoulder as your arm rises, by keeping your shoulder blade tucked down and toward your mid-back spine. Hold for __________ seconds.  Slowly return to the starting position. Repeat   __________ times. Complete this exercise __________ times per day. ROM  Flexion, Active-Assisted  Lie on your back. You may bend your knees for comfort.  Grasp a broomstick or cane so your hands are about shoulder width apart. Your right / left hand should grip the end of the stick, so that your hand is positioned "thumbs-up," as if you were about to shake hands.  Using your healthy arm to lead, raise your right / left arm overhead, until  you feel a gentle stretch in your shoulder. Hold for __________ seconds.  Use the stick to assist in returning your right / left arm to its starting position. Repeat __________ times. Complete this exercise __________ times per day.  ROM - Internal Rotation, Supine   Lie on your back on a firm surface. Place your right / left elbow about 60 degrees away from your side. Elevate your elbow with a folded towel, so that the elbow and shoulder are the same height.  Using a broomstick or cane and your strong arm, pull your right / left hand toward your body until you feel a gentle stretch, but no increase in your shoulder pain. Keep your shoulder and elbow in place throughout the exercise.  Hold for __________ seconds. Slowly return to the starting position. Repeat __________ times. Complete this exercise __________ times per day. STRETCH - Internal Rotation  Place your right / left hand behind your back, palm up.  Throw a towel or belt over your opposite shoulder. Grasp the towel with your right / left hand.  While keeping an upright posture, gently pull up on the towel, until you feel a stretch in the front of your right / left shoulder.  Avoid shrugging your right / left shoulder as your arm rises, by keeping your shoulder blade tucked down and toward your mid-back spine.  Hold for __________ seconds. Release the stretch, by lowering your healthy hand. Repeat __________ times. Complete this exercise __________ times per day. ROM - Internal Rotation   Using an underhand grip, grasp a stick behind your back with both hands.  While standing upright with good posture, slide the stick up your back until you feel a mild stretch in the front of your shoulder.  Hold for __________ seconds. Slowly return to your starting position. Repeat __________ times. Complete this exercise __________ times per day.  STRETCH  Posterior Shoulder Capsule   Stand or sit with good posture. Grasp your right /  left elbow and draw it across your chest, keeping it at the same height as your shoulder.  Pull your elbow, so your upper arm comes in closer to your chest. Pull until you feel a gentle stretch in the back of your shoulder.  Hold for __________ seconds. Repeat __________ times. Complete this exercise __________ times per day. STRENGTHENING EXERCISES - Impingement Syndrome (Rotator Cuff Tendinitis, Bursitis) These exercises may help you when beginning to rehabilitate your injury. They may resolve your symptoms with or without further involvement from your physician, physical therapist or athletic trainer. While completing these exercises, remember:  Muscles can gain both the endurance and the strength needed for everyday activities through controlled exercises.  Complete these exercises as instructed by your physician, physical therapist or athletic trainer. Increase the resistance and repetitions only as guided.  You may experience muscle soreness or fatigue, but the pain or discomfort you are trying to eliminate should never worsen during these exercises. If this pain does get worse, stop and make sure you are following   the directions exactly. If the pain is still present after adjustments, discontinue the exercise until you can discuss the trouble with your clinician.  During your recovery, avoid activity or exercises which involve actions that place your injured hand or elbow above your head or behind your back or head. These positions stress the tissues which you are trying to heal. STRENGTH - Scapular Depression and Adduction   With good posture, sit on a firm chair. Support your arms in front of you, with pillows, arm rests, or on a table top. Have your elbows in line with the sides of your body.  Gently draw your shoulder blades down and toward your mid-back spine. Gradually increase the tension, without tensing the muscles along the top of your shoulders and the back of your neck.  Hold  for __________ seconds. Slowly release the tension and relax your muscles completely before starting the next repetition.  After you have practiced this exercise, remove the arm support and complete the exercise in standing as well as sitting position. Repeat __________ times. Complete this exercise __________ times per day.  STRENGTH - Shoulder Abductors, Isometric  With good posture, stand or sit about 4-6 inches from a wall, with your right / left side facing the wall.  Bend your right / left elbow. Gently press your right / left elbow into the wall. Increase the pressure gradually, until you are pressing as hard as you can, without shrugging your shoulder or increasing any shoulder discomfort.  Hold for __________ seconds.  Release the tension slowly. Relax your shoulder muscles completely before you begin the next repetition. Repeat __________ times. Complete this exercise __________ times per day.  STRENGTH - External Rotators, Isometric  Keep your right / left elbow at your side and bend it 90 degrees.  Step into a door frame so that the outside of your right / left wrist can press against the door frame without your upper arm leaving your side.  Gently press your right / left wrist into the door frame, as if you were trying to swing the back of your hand away from your stomach. Gradually increase the tension, until you are pressing as hard as you can, without shrugging your shoulder or increasing any shoulder discomfort.  Hold for __________ seconds.  Release the tension slowly. Relax your shoulder muscles completely before you begin the next repetition. Repeat __________ times. Complete this exercise __________ times per day.  STRENGTH - Supraspinatus   Stand or sit with good posture. Grasp a __________ weight, or an exercise band or tubing, so that your hand is "thumbs-up," like you are shaking hands.  Slowly lift your right / left arm in a "V" away from your thigh, diagonally  into the space between your side and straight ahead. Lift your hand to shoulder height or as far as you can, without increasing any shoulder pain. At first, many people do not lift their hands above shoulder height.  Avoid shrugging your right / left shoulder as your arm rises, by keeping your shoulder blade tucked down and toward your mid-back spine.  Hold for __________ seconds. Control the descent of your hand, as you slowly return to your starting position. Repeat __________ times. Complete this exercise __________ times per day.  STRENGTH - External Rotators  Secure a rubber exercise band or tubing to a fixed object (table, pole) so that it is at the same height as your right / left elbow when you are standing or sitting on a firm   surface.  Stand or sit so that the secured exercise band is at your uninjured side.  Bend your right / left elbow 90 degrees. Place a folded towel or small pillow under your right / left arm, so that your elbow is a few inches away from your side.  Keeping the tension on the exercise band, pull it away from your body, as if pivoting on your elbow. Be sure to keep your body steady, so that the movement is coming only from your rotating shoulder.  Hold for __________ seconds. Release the tension in a controlled manner, as you return to the starting position. Repeat __________ times. Complete this exercise __________ times per day.  STRENGTH - Internal Rotators   Secure a rubber exercise band or tubing to a fixed object (table, pole) so that it is at the same height as your right / left elbow when you are standing or sitting on a firm surface.  Stand or sit so that the secured exercise band is at your right / left side.  Bend your elbow 90 degrees. Place a folded towel or small pillow under your right / left arm so that your elbow is a few inches away from your side.  Keeping the tension on the exercise band, pull it across your body, toward your stomach. Be  sure to keep your body steady, so that the movement is coming only from your rotating shoulder.  Hold for __________ seconds. Release the tension in a controlled manner, as you return to the starting position. Repeat __________ times. Complete this exercise __________ times per day.  STRENGTH - Scapular Protractors, Standing   Stand arms length away from a wall. Place your hands on the wall, keeping your elbows straight.  Begin by dropping your shoulder blades down and toward your mid-back spine.  To strengthen your protractors, keep your shoulder blades down, but slide them forward on your rib cage. It will feel as if you are lifting the back of your rib cage away from the wall. This is a subtle motion and can be challenging to complete. Ask your caregiver for further instruction, if you are not sure you are doing the exercise correctly.  Hold for __________ seconds. Slowly return to the starting position, resting the muscles completely before starting the next repetition. Repeat __________ times. Complete this exercise __________ times per day. STRENGTH - Scapular Protractors, Supine  Lie on your back on a firm surface. Extend your right / left arm straight into the air while holding a __________ weight in your hand.  Keeping your head and back in place, lift your shoulder off the floor.  Hold for __________ seconds. Slowly return to the starting position, and allow your muscles to relax completely before starting the next repetition. Repeat __________ times. Complete this exercise __________ times per day. STRENGTH - Scapular Protractors, Quadruped  Get onto your hands and knees, with your shoulders directly over your hands (or as close as you can be, comfortably).  Keeping your elbows locked, lift the back of your rib cage up into your shoulder blades, so your mid-back rounds out. Keep your neck muscles relaxed.  Hold this position for __________ seconds. Slowly return to the starting  position and allow your muscles to relax completely before starting the next repetition. Repeat __________ times. Complete this exercise __________ times per day.  STRENGTH - Scapular Retractors  Secure a rubber exercise band or tubing to a fixed object (table, pole), so that it is at the height   of your shoulders when you are either standing, or sitting on a firm armless chair.  With a palm down grip, grasp an end of the band in each hand. Straighten your elbows and lift your hands straight in front of you, at shoulder height. Step back, away from the secured end of the band, until it becomes tense.  Squeezing your shoulder blades together, draw your elbows back toward your sides, as you bend them. Keep your upper arms lifted away from your body throughout the exercise.  Hold for __________ seconds. Slowly ease the tension on the band, as you reverse the directions and return to the starting position. Repeat __________ times. Complete this exercise __________ times per day. STRENGTH - Shoulder Extensors   Secure a rubber exercise band or tubing to a fixed object (table, pole) so that it is at the height of your shoulders when you are either standing, or sitting on a firm armless chair.  With a thumbs-up grip, grasp an end of the band in each hand. Straighten your elbows and lift your hands straight in front of you, at shoulder height. Step back, away from the secured end of the band, until it becomes tense.  Squeezing your shoulder blades together, pull your hands down to the sides of your thighs. Do not allow your hands to go behind you.  Hold for __________ seconds. Slowly ease the tension on the band, as you reverse the directions and return to the starting position. Repeat __________ times. Complete this exercise __________ times per day.  STRENGTH - Scapular Retractors and External Rotators   Secure a rubber exercise band or tubing to a fixed object (table, pole) so that it is at the  height as your shoulders, when you are either standing, or sitting on a firm armless chair.  With a palm down grip, grasp an end of the band in each hand. Bend your elbows 90 degrees and lift your elbows to shoulder height, at your sides. Step back, away from the secured end of the band, until it becomes tense.  Squeezing your shoulder blades together, rotate your shoulders so that your upper arms and elbows remain stationary, but your fists travel upward to head height.  Hold for __________ seconds. Slowly ease the tension on the band, as you reverse the directions and return to the starting position. Repeat __________ times. Complete this exercise __________ times per day.  STRENGTH - Scapular Retractors and External Rotators, Rowing   Secure a rubber exercise band or tubing to a fixed object (table, pole) so that it is at the height of your shoulders, when you are either standing, or sitting on a firm armless chair.  With a palm down grip, grasp an end of the band in each hand. Straighten your elbows and lift your hands straight in front of you, at shoulder height. Step back, away from the secured end of the band, until it becomes tense.  Step 1: Squeeze your shoulder blades together. Bending your elbows, draw your hands to your chest, as if you are rowing a boat. At the end of this motion, your hands and elbow should be at shoulder height and your elbows should be out to your sides.  Step 2: Rotate your shoulders, to raise your hands above your head. Your forearms should be vertical and your upper arms should be horizontal.  Hold for __________ seconds. Slowly ease the tension on the band, as you reverse the directions and return to the starting position. Repeat __________   times. Complete this exercise __________ times per day.  STRENGTH  Scapular Depressors  Find a sturdy chair without wheels, such as a dining room chair.  Keeping your feet on the floor, and your hands on the chair arms,  lift your bottom up from the seat, and lock your elbows.  Keeping your elbows straight, allow gravity to pull your body weight down. Your shoulders will rise toward your ears.  Raise your body against gravity by drawing your shoulder blades down your back, shortening the distance between your shoulders and ears. Although your feet should always maintain contact with the floor, your feet should progressively support less body weight, as you get stronger.  Hold for __________ seconds. In a controlled and slow manner, lower your body weight to begin the next repetition. Repeat __________ times. Complete this exercise __________ times per day.  Document Released: 12/21/2004 Document Revised: 03/15/2011 Document Reviewed: 04/04/2008 [employer] Patient Information 2014 [employer]  

## 2013-01-19 NOTE — Progress Notes (Signed)
Subjective:    Patient ID: Cathy Hensley, female    DOB: 09/26/1940, 73 y.o.   MRN: [id number]  HPI 73 year old patient who has a history of oxygen-dependent COPD. She presents with a two-week history right shoulder pain. Pain is aggravated by movement of the shoulder and is referred to the elbow area. No trauma. Past Medical History  Diagnosis Date  . COPD 07/13/2006  . HYPERTENSION 07/13/2006  . MENOPAUSAL SYNDROME 07/13/2006  . Other and unspecified hyperlipidemia 07/13/2006    History   Social History  . Marital Status: Married    Spouse Name: N/A    Number of Children: N/A  . Years of Education: N/A   Occupational History  . Not on file.   Social History Main Topics  . Smoking status: Former Games developermoker  . Smokeless tobacco: Former NeurosurgeonUser    Quit date: 01/05/2004  . Alcohol Use: Not on file  . Drug Use: Not on file  . Sexual Activity: Not on file   Other Topics Concern  . Not on file   Social History Narrative  . No narrative on file    Past Surgical History  Procedure Laterality Date  . Tonsillectomy      No family history on file.  No Known Allergies  Current Outpatient Prescriptions on File Prior to Visit  Medication Sig Dispense Refill  . budesonide (PULMICORT) 0.5 MG/2ML nebulizer solution Take 2 mLs (0.5 mg total) by nebulization 2 (two) times daily.  60 mL  12  . budesonide-formoterol (SYMBICORT) 160-4.5 MCG/ACT inhaler Inhale 2 puffs into the lungs 2 (two) times daily.  10.2 g  7  . NON FORMULARY O2 - 2-3 L/m        No current facility-administered medications on file prior to visit.    BP 140/80  Pulse 80  Temp(Src) 98 F (36.7 C) (Oral)  Resp 20  Ht 5\' 5"  (1.651 m)  Wt 118 lb (53.524 kg)  BMI 19.64 kg/m2  SpO2 96%       Review of Systems  Constitutional: Negative.   HENT: Negative for congestion, dental problem, hearing loss, rhinorrhea, sinus pressure, sore throat and tinnitus.   Eyes: Negative for pain, discharge and visual disturbance.    Respiratory: Negative for cough and shortness of breath.   Cardiovascular: Negative for chest pain, palpitations and leg swelling.  Gastrointestinal: Negative for nausea, vomiting, abdominal pain, diarrhea, constipation, blood in stool and abdominal distention.  Genitourinary: Negative for dysuria, urgency, frequency, hematuria, flank pain, vaginal bleeding, vaginal discharge, difficulty urinating, vaginal pain and pelvic pain.  Musculoskeletal: Negative for arthralgias, gait problem and joint swelling.       Right shoulder pain with movement  Skin: Negative for rash.  Neurological: Negative for dizziness, syncope, speech difficulty, weakness, numbness and headaches.  Hematological: Negative for adenopathy.  Psychiatric/Behavioral: Negative for behavioral problems, dysphoric mood and agitation. The patient is not nervous/anxious.        Objective:   Physical Exam  Constitutional: She appears well-developed and well-nourished. No distress.  Nasal cannula O2 in place  Cardiovascular: Normal rate and regular rhythm.   Pulmonary/Chest: Effort normal and breath sounds normal.  Musculoskeletal:  Painful arc test is positive at 90 Positive drop arm test Positive for internal rotation lag chest Normal external rotation resistance test Normal external rotation lag test          Assessment & Plan:    right shoulder pain / right rotator cuff tendinopathy. We'll treat with Depo-Medrol 80. Will treat with  rehabilitation exercises and observe. Will call if unimproved

## 2013-01-19 NOTE — Progress Notes (Signed)
Pre-visit discussion using our clinic review tool. No additional management support is needed unless otherwise documented below in the visit note.  

## 2013-01-22 ENCOUNTER — Telehealth: Payer: Self-pay | Admitting: Internal Medicine

## 2013-01-22 NOTE — Telephone Encounter (Signed)
Relevant patient education mailed to patient.  

## 2013-02-07 ENCOUNTER — Telehealth: Payer: Self-pay | Admitting: Internal Medicine

## 2013-02-07 NOTE — Telephone Encounter (Signed)
Relevant patient education mailed to patient.  

## 2013-04-16 ENCOUNTER — Ambulatory Visit (INDEPENDENT_AMBULATORY_CARE_PROVIDER_SITE_OTHER): Payer: Medicare Other | Admitting: Internal Medicine

## 2013-04-16 ENCOUNTER — Encounter: Payer: Self-pay | Admitting: Internal Medicine

## 2013-04-16 VITALS — BP 120/66 | HR 87 | Temp 98.6°F | Resp 20 | Ht 65.0 in | Wt 115.0 lb

## 2013-04-16 DIAGNOSIS — J449 Chronic obstructive pulmonary disease, unspecified: Secondary | ICD-10-CM

## 2013-04-16 DIAGNOSIS — I10 Essential (primary) hypertension: Secondary | ICD-10-CM

## 2013-04-16 DIAGNOSIS — M25511 Pain in right shoulder: Secondary | ICD-10-CM

## 2013-04-16 DIAGNOSIS — M25519 Pain in unspecified shoulder: Secondary | ICD-10-CM

## 2013-04-16 DIAGNOSIS — J441 Chronic obstructive pulmonary disease with (acute) exacerbation: Secondary | ICD-10-CM

## 2013-04-16 MED ORDER — PREDNISONE 10 MG PO TABS: 10.0000 mg | ORAL_TABLET | Freq: Two times a day (BID) | ORAL | Status: DC

## 2013-04-16 MED ORDER — AZITHROMYCIN 250 MG PO TABS: ORAL_TABLET | ORAL | Status: DC

## 2013-04-16 NOTE — Progress Notes (Signed)
Subjective:    Patient ID: Cathy Hensley, female    DOB: 12-Apr-1940, 73 y.o.   MRN: [id number]  HPI  73 year old patient who has a history of severe oxygen-dependent COPD.  She is on home O2 3 L per minute nasal cannula.  The past 2 or 3 weeks, she has had increasing chest and sinus congestion.  She has become more short of breath with productive cough.  No documented fever.  She is also complaining of persistent right shoulder pain and decreased range of motion.  She is requesting orthopedic referral   Past Medical History  Diagnosis Date  . COPD 07/13/2006  . HYPERTENSION 07/13/2006  . MENOPAUSAL SYNDROME 07/13/2006  . Other and unspecified hyperlipidemia 07/13/2006    History   Social History  . Marital Status: Married    Spouse Name: N/A    Number of Children: N/A  . Years of Education: N/A   Occupational History  . Not on file.   Social History Main Topics  . Smoking status: Former Games developer  . Smokeless tobacco: Former Neurosurgeon    Quit date: 01/05/2004  . Alcohol Use: Not on file  . Drug Use: Not on file  . Sexual Activity: Not on file   Other Topics Concern  . Not on file   Social History Narrative  . No narrative on file    Past Surgical History  Procedure Laterality Date  . Tonsillectomy      No family history on file.  No Known Allergies  Current Outpatient Prescriptions on File Prior to Visit  Medication Sig Dispense Refill  . budesonide (PULMICORT) 0.5 MG/2ML nebulizer solution Take 2 mLs (0.5 mg total) by nebulization 2 (two) times daily.  60 mL  12  . fluticasone (FLONASE) 50 MCG/ACT nasal spray Place 2 sprays into both nostrils daily.  16 g  6  . furosemide (LASIX) 20 MG tablet Take 1 tablet (20 mg total) by mouth daily.  90 tablet  3  . NON FORMULARY O2 - 2-3 L/m       . simvastatin (ZOCOR) 40 MG tablet take 1 tablet by mouth at bedtime  90 tablet  1  . budesonide-formoterol (SYMBICORT) 160-4.5 MCG/ACT inhaler Inhale 2 puffs into the lungs 2 (two) times daily.   10.2 g  7   No current facility-administered medications on file prior to visit.    BP 120/66  Pulse 87  Temp(Src) 98.6 F (37 C) (Oral)  Resp 20  Ht 5\' 5"  (1.651 m)  Wt 115 lb (52.164 kg)  BMI 19.14 kg/m2  SpO2 91%       Review of Systems  Constitutional: Positive for activity change and appetite change.  HENT: Positive for congestion, rhinorrhea and sinus pressure. Negative for dental problem, hearing loss, sore throat and tinnitus.   Eyes: Negative for pain, discharge and visual disturbance.  Respiratory: Positive for cough and shortness of breath.   Cardiovascular: Negative for chest pain, palpitations and leg swelling.  Gastrointestinal: Negative for nausea, vomiting, abdominal pain, diarrhea, constipation, blood in stool and abdominal distention.  Genitourinary: Negative for dysuria, urgency, frequency, hematuria, flank pain, vaginal bleeding, vaginal discharge, difficulty urinating, vaginal pain and pelvic pain.  Musculoskeletal: Negative for arthralgias, gait problem and joint swelling.  Skin: Negative for rash.  Neurological: Negative for dizziness, syncope, speech difficulty, weakness, numbness and headaches.  Hematological: Negative for adenopathy.  Psychiatric/Behavioral: Negative for behavioral problems, dysphoric mood and agitation. The patient is not nervous/anxious.  Objective:   Physical Exam  Constitutional: She is oriented to person, place, and time. She appears well-developed and well-nourished.  Nasal cannula O2 in place No distress.  Afebrile   HENT:  Head: Normocephalic.  Right Ear: External ear normal.  Left Ear: External ear normal.  Mouth/Throat: Oropharynx is clear and moist.  Eyes: Conjunctivae and EOM are normal. Pupils are equal, round, and reactive to light.  Neck: Normal range of motion. Neck supple. No thyromegaly present.  Cardiovascular: Normal rate, regular rhythm, normal heart sounds and intact distal pulses.     Pulmonary/Chest: Effort normal.  Diminished breath sounds O2 saturation 90-91%  Abdominal: Soft. Bowel sounds are normal. She exhibits no mass. There is no tenderness.  Musculoskeletal: Normal range of motion.  Lymphadenopathy:    She has no cervical adenopathy.  Neurological: She is alert and oriented to person, place, and time.  Skin: Skin is warm and dry. No rash noted.  Psychiatric: She has a normal mood and affect. Her behavior is normal.          Assessment & Plan:   Exacerbation of COPD, GERD, we'll treat with a prednisone dose pack, and azithromycin.  We'll continue aggressive inhalational medications.  We'll plan a short-term expectorant Hypertension Right shoulder pain.  Persistent.  We'll set up for orthopedic evaluation

## 2013-04-16 NOTE — Patient Instructions (Signed)
Take over-the-counter expectorants and cough medications such as  Mucinex DM.  Call if there is no improvement in 5 to 7 days or if he developed worsening cough, fever, or new symptoms, such as shortness of breath or chest pain.  Drink as much fluid as you  can tolerate over the next few days  Take your antibiotic as prescribed until ALL of it is gone, but stop if you develop a rash, swelling, or any side effects of the medication.  Contact our office as soon as possible if  there are side effects of the medication.

## 2013-09-21 ENCOUNTER — Encounter: Payer: Self-pay | Admitting: Internal Medicine

## 2013-09-21 ENCOUNTER — Ambulatory Visit (INDEPENDENT_AMBULATORY_CARE_PROVIDER_SITE_OTHER): Payer: Medicare Other | Admitting: Internal Medicine

## 2013-09-21 VITALS — BP 130/70 | HR 80 | Temp 98.0°F | Resp 20 | Ht 65.0 in | Wt 116.0 lb

## 2013-09-21 DIAGNOSIS — I10 Essential (primary) hypertension: Secondary | ICD-10-CM

## 2013-09-21 DIAGNOSIS — L988 Other specified disorders of the skin and subcutaneous tissue: Secondary | ICD-10-CM

## 2013-09-21 DIAGNOSIS — J449 Chronic obstructive pulmonary disease, unspecified: Secondary | ICD-10-CM

## 2013-09-21 NOTE — Progress Notes (Signed)
Pre visit review using our clinic review tool, if applicable. No additional management support is needed unless otherwise documented below in the visit note. 

## 2013-09-21 NOTE — Patient Instructions (Signed)
Schedule annual exam. If you are due for your PAP, we can complete this at your annual. Ensure you are not mensurating if you need a PAP smear.      Blood work today, if not Schedule 8 hour fasting blood work at [facility] or [facility]. Please drink plenty of water and bring a snack for afterwards. Our earliest blood draw is at 830am.    To refer to Endocrinology after lab tests.    Check and log blood pressure for 1 week before appointment. Bring log and blood pressure monitor to next appointment.     Decrease salt in diet.     EKG today.    Add magnesium glycinate 400 mg to try and improve your sleep.    Please allow for your Neurology referral two weeks to be authorized. Please call [facility] if you have not heard back. Can also check MyChart. You may contact the [facility] referrals department at [phone] with any questions.

## 2013-09-21 NOTE — Progress Notes (Signed)
Subjective:    Patient ID: Cathy Hensley, female    DOB: 1940/09/22, 73 y.o.   MRN: [id number]  HPI  73 year old patient who has a history of advanced oxygen dependent, COPD.  She is seen today concerned about a skin lesion involving her back noticed by her daughter.  Past Medical History  Diagnosis Date  . COPD 07/13/2006  . HYPERTENSION 07/13/2006  . MENOPAUSAL SYNDROME 07/13/2006  . Other and unspecified hyperlipidemia 07/13/2006    History   Social History  . Marital Status: Married    Spouse Name: N/A    Number of Children: N/A  . Years of Education: N/A   Occupational History  . Not on file.   Social History Main Topics  . Smoking status: Former Games developer  . Smokeless tobacco: Former Neurosurgeon    Quit date: 01/05/2004  . Alcohol Use: Not on file  . Drug Use: Not on file  . Sexual Activity: Not on file   Other Topics Concern  . Not on file   Social History Narrative  . No narrative on file    Past Surgical History  Procedure Laterality Date  . Tonsillectomy      No family history on file.  No Known Allergies  Current Outpatient Prescriptions on File Prior to Visit  Medication Sig Dispense Refill  . acetaminophen (TYLENOL) 500 MG tablet Take 500 mg by mouth every 6 (six) hours as needed.      . budesonide (PULMICORT) 0.5 MG/2ML nebulizer solution Take 2 mLs (0.5 mg total) by nebulization 2 (two) times daily.  60 mL  12  . budesonide-formoterol (SYMBICORT) 160-4.5 MCG/ACT inhaler Inhale 2 puffs into the lungs 2 (two) times daily.  10.2 g  7  . fluticasone (FLONASE) 50 MCG/ACT nasal spray Place 2 sprays into both nostrils daily.  16 g  6  . furosemide (LASIX) 20 MG tablet Take 1 tablet (20 mg total) by mouth daily.  90 tablet  3  . ipratropium-albuterol (DUONEB) 0.5-2.5 (3) MG/3ML SOLN Take 3 mLs by nebulization every 4 (four) hours as needed.      . NON FORMULARY O2 - 2-3 L/m       . simvastatin (ZOCOR) 40 MG tablet take 1 tablet by mouth at bedtime  90 tablet  1   No  current facility-administered medications on file prior to visit.    BP 130/70  Pulse 80  Temp(Src) 98 F (36.7 C) (Oral)  Resp 20  Ht 5\' 5"  (1.651 m)  Wt 116 lb (52.617 kg)  BMI 19.30 kg/m2  SpO2 94%     Review of Systems  Constitutional: Negative.   HENT: Negative for congestion, dental problem, hearing loss, rhinorrhea, sinus pressure, sore throat and tinnitus.   Eyes: Negative for pain, discharge and visual disturbance.  Respiratory: Negative for cough and shortness of breath.   Cardiovascular: Negative for chest pain, palpitations and leg swelling.  Gastrointestinal: Negative for nausea, vomiting, abdominal pain, diarrhea, constipation, blood in stool and abdominal distention.  Genitourinary: Negative for dysuria, urgency, frequency, hematuria, flank pain, vaginal bleeding, vaginal discharge, difficulty urinating, vaginal pain and pelvic pain.  Musculoskeletal: Negative for arthralgias, gait problem and joint swelling.  Skin: Positive for wound. Negative for rash.  Neurological: Negative for dizziness, syncope, speech difficulty, weakness, numbness and headaches.  Hematological: Negative for adenopathy.  Psychiatric/Behavioral: Negative for behavioral problems, dysphoric mood and agitation. The patient is not nervous/anxious.        Objective:   Physical Exam  Constitutional: She  appears well-developed and well-nourished. No distress.  Nasal cannula oxygen in place  Pulmonary/Chest:  Diminished breath sounds, but clear.  O2 saturation 94% on supplemental oxygen   Skin:  Lesion of concern is a benign seborrheic dermatosis          Assessment & Plan:   Seborrheic dermatosis COPD stable  The patient will schedule a CPX at her convenience Patient reassured

## 2013-10-12 ENCOUNTER — Ambulatory Visit (INDEPENDENT_AMBULATORY_CARE_PROVIDER_SITE_OTHER): Payer: Medicare Other | Admitting: Internal Medicine

## 2013-10-12 ENCOUNTER — Encounter: Payer: Self-pay | Admitting: Internal Medicine

## 2013-10-12 VITALS — BP 136/70 | HR 79 | Temp 98.5°F | Wt 116.0 lb

## 2013-10-12 DIAGNOSIS — M549 Dorsalgia, unspecified: Secondary | ICD-10-CM

## 2013-10-12 DIAGNOSIS — J432 Centrilobular emphysema: Secondary | ICD-10-CM

## 2013-10-12 NOTE — Progress Notes (Signed)
Subjective:    Patient ID: Cathy Hensley, female    DOB: 08/10/1940, 73 y.o.   MRN: [id number]  HPI 73 year old patient who has a history of oxygen-dependent COPD, and hypertension.  She presents with a four-day history of right lumbar discomfort.  This began after several days spending hours on her feet doing baking in the kitchen.  Pain is aggravated by movement and alleviated by rest.  No constitutional complaints.  Her palmar status has been stable. Past Medical History  Diagnosis Date  . COPD 07/13/2006  . HYPERTENSION 07/13/2006  . MENOPAUSAL SYNDROME 07/13/2006  . Other and unspecified hyperlipidemia 07/13/2006    History   Social History  . Marital Status: Married    Spouse Name: N/A    Number of Children: N/A  . Years of Education: N/A   Occupational History  . Not on file.   Social History Main Topics  . Smoking status: Former Games developermoker  . Smokeless tobacco: Former NeurosurgeonUser    Quit date: 01/05/2004  . Alcohol Use: Not on file  . Drug Use: Not on file  . Sexual Activity: Not on file   Other Topics Concern  . Not on file   Social History Narrative  . No narrative on file    Past Surgical History  Procedure Laterality Date  . Tonsillectomy      No family history on file.  No Known Allergies  Current Outpatient Prescriptions on File Prior to Visit  Medication Sig Dispense Refill  . acetaminophen (TYLENOL) 500 MG tablet Take 500 mg by mouth every 6 (six) hours as needed.      . budesonide (PULMICORT) 0.5 MG/2ML nebulizer solution Take 2 mLs (0.5 mg total) by nebulization 2 (two) times daily.  60 mL  12  . budesonide-formoterol (SYMBICORT) 160-4.5 MCG/ACT inhaler Inhale 2 puffs into the lungs 2 (two) times daily.  10.2 g  7  . fluticasone (FLONASE) 50 MCG/ACT nasal spray Place 2 sprays into both nostrils daily.  16 g  6  . furosemide (LASIX) 20 MG tablet Take 1 tablet (20 mg total) by mouth daily.  90 tablet  3  . ipratropium-albuterol (DUONEB) 0.5-2.5 (3) MG/3ML SOLN Take 3  mLs by nebulization every 4 (four) hours as needed.      . NON FORMULARY O2 - 2-3 L/m       . simvastatin (ZOCOR) 40 MG tablet take 1 tablet by mouth at bedtime  90 tablet  1   No current facility-administered medications on file prior to visit.    BP 136/70  Pulse 79  Temp(Src) 98.5 F (36.9 C) (Oral)  Wt 116 lb (52.617 kg)  SpO2 92%       Review of Systems  Constitutional: Negative.   HENT: Negative for congestion, dental problem, hearing loss, rhinorrhea, sinus pressure, sore throat and tinnitus.   Eyes: Negative for pain, discharge and visual disturbance.  Respiratory: Positive for shortness of breath. Negative for cough.   Cardiovascular: Negative for chest pain, palpitations and leg swelling.  Gastrointestinal: Negative for nausea, vomiting, abdominal pain, diarrhea, constipation, blood in stool and abdominal distention.  Genitourinary: Negative for dysuria, urgency, frequency, hematuria, flank pain, vaginal bleeding, vaginal discharge, difficulty urinating, vaginal pain and pelvic pain.  Musculoskeletal: Positive for back pain. Negative for arthralgias, gait problem and joint swelling.  Skin: Negative for rash.  Neurological: Negative for dizziness, syncope, speech difficulty, weakness, numbness and headaches.  Hematological: Negative for adenopathy.  Psychiatric/Behavioral: Negative for behavioral problems, dysphoric mood  and agitation. The patient is not nervous/anxious.        Objective:   Physical Exam  Constitutional: She is oriented to person, place, and time. She appears well-developed and well-nourished. No distress.  Thin Nasal cannula O2 in place Blood pressure 130/70 No distress  HENT:  Head: Normocephalic.  Right Ear: External ear normal.  Left Ear: External ear normal.  Mouth/Throat: Oropharynx is clear and moist.  Eyes: Conjunctivae and EOM are normal. Pupils are equal, round, and reactive to light.  Neck: Normal range of motion. Neck supple. No  thyromegaly present.  Cardiovascular: Normal rate, regular rhythm, normal heart sounds and intact distal pulses.   Pulmonary/Chest: Effort normal.  Diminished lung sounds, but clear  Abdominal: Soft. Bowel sounds are normal. She exhibits no mass. There is no tenderness.  Musculoskeletal: Normal range of motion.  Slight tenderness over the right lumbar region Pain aggravated by twisting at the waist to the left or the right Negative straight leg test Normal foot flexion and extension  Lymphadenopathy:    She has no cervical adenopathy.  Neurological: She is alert and oriented to person, place, and time.  Skin: Skin is warm and dry. No rash noted.  Psychiatric: She has a normal mood and affect. Her behavior is normal.          Assessment & Plan:  Right lumbar strain.  The patient will rest use warm compresses and continue with Tylenol.  She will call if unimproved Advanced COPD, stable.  Hypertension, stable  CPX as scheduled We'll call if unimproved in

## 2013-10-12 NOTE — Patient Instructions (Signed)
Most patients with low back pain will improve with time over the next two to 6 weeks.  Keep active but avoid any activities that cause pain.  Apply moist heat to the low back area several times daily. 

## 2013-10-12 NOTE — Progress Notes (Signed)
Pre visit review using our clinic review tool, if applicable. No additional management support is needed unless otherwise documented below in the visit note. 

## 2013-11-02 ENCOUNTER — Encounter: Payer: Self-pay | Admitting: Internal Medicine

## 2013-11-02 ENCOUNTER — Ambulatory Visit (INDEPENDENT_AMBULATORY_CARE_PROVIDER_SITE_OTHER): Payer: Medicare Other | Admitting: Internal Medicine

## 2013-11-02 VITALS — BP 140/70 | HR 84 | Temp 98.2°F | Resp 20 | Ht 65.0 in | Wt 114.0 lb

## 2013-11-02 DIAGNOSIS — E785 Hyperlipidemia, unspecified: Secondary | ICD-10-CM

## 2013-11-02 DIAGNOSIS — I10 Essential (primary) hypertension: Secondary | ICD-10-CM

## 2013-11-02 DIAGNOSIS — J431 Panlobular emphysema: Secondary | ICD-10-CM

## 2013-11-02 MED ORDER — AZITHROMYCIN 250 MG PO TABS: ORAL_TABLET | ORAL | Status: DC

## 2013-11-02 NOTE — Progress Notes (Signed)
Pre visit review using our clinic review tool, if applicable. No additional management support is needed unless otherwise documented below in the visit note. 

## 2013-11-02 NOTE — Progress Notes (Signed)
Subjective:    Patient ID: Cathy Hensley, female    DOB: [birth year], 73 y.o.   MRN: [id number]  HPI 73 year old patient who has a history of advanced oxygen-dependent COPD.  She was stable until about 1 week ago when she developed head and chest congestion.  This was associated with early mild sore throat that has resolved.  There is been no fever.  She has been maintained on her inhalational medications.  There's been slight increase shortness of breath and more recently has developed a productive cough yielding very thick green sputum.  There's been no fever or active wheezing.  Denies any chills.  Past Medical History  Diagnosis Date  . COPD 07/13/2006  . HYPERTENSION 07/13/2006  . MENOPAUSAL SYNDROME 07/13/2006  . Other and unspecified hyperlipidemia 07/13/2006    History   Social History  . Marital Status: Married    Spouse Name: N/A    Number of Children: N/A  . Years of Education: N/A   Occupational History  . Not on file.   Social History Main Topics  . Smoking status: Former Games developer  . Smokeless tobacco: Former Neurosurgeon    Quit date: 01/05/2004  . Alcohol Use: Not on file  . Drug Use: Not on file  . Sexual Activity: Not on file   Other Topics Concern  . Not on file   Social History Narrative  . No narrative on file    Past Surgical History  Procedure Laterality Date  . Tonsillectomy      No family history on file.  No Known Allergies  Current Outpatient Prescriptions on File Prior to Visit  Medication Sig Dispense Refill  . acetaminophen (TYLENOL) 500 MG tablet Take 500 mg by mouth every 6 (six) hours as needed.      . budesonide (PULMICORT) 0.5 MG/2ML nebulizer solution Take 2 mLs (0.5 mg total) by nebulization 2 (two) times daily.  60 mL  12  . budesonide-formoterol (SYMBICORT) 160-4.5 MCG/ACT inhaler Inhale 2 puffs into the lungs 2 (two) times daily.  10.2 g  7  . fluticasone (FLONASE) 50 MCG/ACT nasal spray Place 2 sprays into both nostrils daily.  16 g  6  .  furosemide (LASIX) 20 MG tablet Take 1 tablet (20 mg total) by mouth daily.  90 tablet  3  . ipratropium-albuterol (DUONEB) 0.5-2.5 (3) MG/3ML SOLN Take 3 mLs by nebulization every 4 (four) hours as needed.      . NON FORMULARY O2 - 2-3 L/m       . simvastatin (ZOCOR) 40 MG tablet take 1 tablet by mouth at bedtime  90 tablet  1   No current facility-administered medications on file prior to visit.    BP 140/70  Pulse 84  Temp(Src) 98.2 F (36.8 C) (Oral)  Resp 20  Ht 5\' 5"  (1.651 m)  Wt 114 lb (51.71 kg)  BMI 18.97 kg/m2  SpO2 91%      Review of Systems  Constitutional: Positive for activity change, appetite change and fatigue. Negative for chills.  HENT: Positive for congestion and sinus pressure. Negative for dental problem, hearing loss, rhinorrhea, sore throat and tinnitus.   Eyes: Negative for pain, discharge and visual disturbance.  Respiratory: Positive for cough and shortness of breath.   Cardiovascular: Negative for chest pain, palpitations and leg swelling.  Gastrointestinal: Negative for nausea, vomiting, abdominal pain, diarrhea, constipation, blood in stool and abdominal distention.  Genitourinary: Negative for dysuria, urgency, frequency, hematuria, flank pain, vaginal bleeding, vaginal discharge, difficulty urinating,  vaginal pain and pelvic pain.  Musculoskeletal: Negative for arthralgias, gait problem and joint swelling.  Skin: Negative for rash.  Neurological: Negative for dizziness, syncope, speech difficulty, weakness, numbness and headaches.  Hematological: Negative for adenopathy.  Psychiatric/Behavioral: Negative for behavioral problems, dysphoric mood and agitation. The patient is not nervous/anxious.        Objective:   Physical Exam  Constitutional: She is oriented to person, place, and time. She appears well-developed and well-nourished.  Appears chronically ill but in no acute distress Nasal cannula oxygen in place  HENT:  Head: Normocephalic.    Right Ear: External ear normal.  Left Ear: External ear normal.  Mouth/Throat: Oropharynx is clear and moist.  Eyes: Conjunctivae and EOM are normal. Pupils are equal, round, and reactive to light.  Neck: Normal range of motion. Neck supple. No thyromegaly present.  Cardiovascular: Normal rate, regular rhythm, normal heart sounds and intact distal pulses.   Pulmonary/Chest: Effort normal.  Generally diminished breath sounds.  Few scattered rhonchi  O2 saturation 91%  Abdominal: Soft. Bowel sounds are normal. She exhibits no mass. There is no tenderness.  Musculoskeletal: Normal range of motion.  Lymphadenopathy:    She has no cervical adenopathy.  Neurological: She is alert and oriented to person, place, and time.  Skin: Skin is warm and dry. No rash noted.  Psychiatric: She has a normal mood and affect. Her behavior is normal.          Assessment & Plan:   Exacerbation COPD.  Will continue aggressive inhalational medications.  Will continue expectorants.  Will hold  parenteral steroids at this time due to lack of wheezing.  Will treat with azithromycin Hypertension, stable Dyslipidemia

## 2013-11-02 NOTE — Patient Instructions (Signed)
Take over-the-counter expectorants and cough medications such as  Mucinex DM.  Call if there is no improvement in 5 to 7 days or if  you develop worsening cough, fever, or new symptoms, such as shortness of breath or chest pain. 

## 2013-12-10 ENCOUNTER — Other Ambulatory Visit: Payer: Self-pay | Admitting: Internal Medicine

## 2014-01-09 DIAGNOSIS — J449 Chronic obstructive pulmonary disease, unspecified: Secondary | ICD-10-CM | POA: Diagnosis not present

## 2014-01-14 ENCOUNTER — Ambulatory Visit (INDEPENDENT_AMBULATORY_CARE_PROVIDER_SITE_OTHER): Payer: Medicare Other | Admitting: Family Medicine

## 2014-01-14 ENCOUNTER — Encounter: Payer: Self-pay | Admitting: Family Medicine

## 2014-01-14 VITALS — BP 122/50 | HR 71 | Temp 97.4°F | Ht 65.0 in | Wt 113.2 lb

## 2014-01-14 DIAGNOSIS — J441 Chronic obstructive pulmonary disease with (acute) exacerbation: Secondary | ICD-10-CM | POA: Diagnosis not present

## 2014-01-14 MED ORDER — AZITHROMYCIN 250 MG PO TABS: ORAL_TABLET | ORAL | Status: DC

## 2014-01-14 MED ORDER — PREDNISONE 20 MG PO TABS: 40.0000 mg | ORAL_TABLET | Freq: Every day | ORAL | Status: DC

## 2014-01-14 NOTE — Patient Instructions (Signed)

## 2014-01-14 NOTE — Progress Notes (Signed)
HPI:  COPD flare: -started: 5 days ago and worsening -symptoms:nasal congestion, sore throat,  Productive cough, mild increased SOB, lots of thick congestion -denies:fever, SOB, NVD, tooth pain -has tried:  nothing -sick contacts/travel/risks: denies flu exposure, tick exposure or or Ebola risks -Hx of: COPD - on nebs and 3L O2with PCP not using more frequently   ROS: See pertinent positives and negatives per HPI.  Past Medical History  Diagnosis Date  . COPD 07/13/2006  . HYPERTENSION 07/13/2006  . MENOPAUSAL SYNDROME 07/13/2006  . Other and unspecified hyperlipidemia 07/13/2006    Past Surgical History  Procedure Laterality Date  . Tonsillectomy      No family history on file.  History   Social History  . Marital Status: Married    Spouse Name: N/A    Number of Children: N/A  . Years of Education: N/A   Social History Main Topics  . Smoking status: Former Games developer  . Smokeless tobacco: Former Neurosurgeon    Quit date: 01/05/2004  . Alcohol Use: None  . Drug Use: None  . Sexual Activity: None   Other Topics Concern  . None   Social History Narrative    Current outpatient prescriptions: acetaminophen (TYLENOL) 500 MG tablet, Take 500 mg by mouth every 6 (six) hours as needed., Disp: , Rfl: ;  budesonide (PULMICORT) 0.5 MG/2ML nebulizer solution, Take 2 mLs (0.5 mg total) by nebulization 2 (two) times daily., Disp: 60 mL, Rfl: 12;  furosemide (LASIX) 20 MG tablet, Take 1 tablet (20 mg total) by mouth daily., Disp: 90 tablet, Rfl: 3 ipratropium-albuterol (DUONEB) 0.5-2.5 (3) MG/3ML SOLN, Take 3 mLs by nebulization every 4 (four) hours as needed., Disp: , Rfl: ;  NON FORMULARY, O2 - 2-3 L/m , Disp: , Rfl: ;  simvastatin (ZOCOR) 40 MG tablet, take 1 tablet by mouth at bedtime, Disp: 90 tablet, Rfl: 1;  azithromycin (ZITHROMAX) 250 MG tablet, 2 tabs on the first day and then 1 tab daily, Disp: 6 tablet, Rfl: 0 predniSONE (DELTASONE) 20 MG tablet, Take 2 tablets (40 mg total) by mouth  daily with breakfast., Disp: 8 tablet, Rfl: 0  EXAM:  Filed Vitals:   01/14/14 1337  BP: 122/50  Pulse: 71  Temp: 97.4 F (36.3 C)    Body mass index is 18.84 kg/(m^2).  GENERAL: vitals reviewed and listed above, alert, oriented, appears well hydrated and in no acute distress  HEENT: atraumatic, conjunttiva clear, no obvious abnormalities on inspection of external nose and ears, normal appearance of ear canals and TMs, clear nasal congestion, mild post oropharyngeal erythema with PND, no tonsillar edema or exudate, no sinus TTP  NECK: no obvious masses on inspection  LUNGS: clear to auscultation bilaterally, no wheezes, rales or rhonchi, good air movement  CV: HRRR, no peripheral edema  MS: moves all extremities without noticeable abnormality  PSYCH: pleasant and cooperative, no obvious depression or anxiety  ASSESSMENT AND PLAN:  Discussed the following assessment and plan:  COPD exacerbation - Plan: azithromycin (ZITHROMAX) 250 MG tablet, predniSONE (DELTASONE) 20 MG tablet  -increased mucus production and reported mild increased work of breathing -opted for prednisone and azithromycin -of course, we advised to return or notify a doctor immediately if symptoms worsen or persist or new concerns arise.    Patient Instructions  -As we discussed, we have prescribed a new medication for you at this appointment. We discussed the common and serious potential adverse effects of this medication and you can review these and more with the  pharmacist when you pick up your medication.  Please follow the instructions for use carefully and notify us immediately if you have any problems taking this medication.      Kriste Basque R.

## 2014-01-14 NOTE — Progress Notes (Signed)
Pre visit review using our clinic review tool, if applicable. No additional management support is needed unless otherwise documented below in the visit note. 

## 2014-02-04 ENCOUNTER — Other Ambulatory Visit: Payer: Self-pay | Admitting: Internal Medicine

## 2014-02-04 DIAGNOSIS — J449 Chronic obstructive pulmonary disease, unspecified: Secondary | ICD-10-CM | POA: Diagnosis not present

## 2014-02-05 ENCOUNTER — Encounter: Payer: Medicare Other | Admitting: Internal Medicine

## 2014-02-09 DIAGNOSIS — J449 Chronic obstructive pulmonary disease, unspecified: Secondary | ICD-10-CM | POA: Diagnosis not present

## 2014-03-06 DIAGNOSIS — J449 Chronic obstructive pulmonary disease, unspecified: Secondary | ICD-10-CM | POA: Diagnosis not present

## 2014-03-10 DIAGNOSIS — J449 Chronic obstructive pulmonary disease, unspecified: Secondary | ICD-10-CM | POA: Diagnosis not present

## 2014-04-08 DIAGNOSIS — H2512 Age-related nuclear cataract, left eye: Secondary | ICD-10-CM | POA: Diagnosis not present

## 2014-04-08 DIAGNOSIS — H25011 Cortical age-related cataract, right eye: Secondary | ICD-10-CM | POA: Diagnosis not present

## 2014-04-08 DIAGNOSIS — H25041 Posterior subcapsular polar age-related cataract, right eye: Secondary | ICD-10-CM | POA: Diagnosis not present

## 2014-04-08 DIAGNOSIS — J449 Chronic obstructive pulmonary disease, unspecified: Secondary | ICD-10-CM | POA: Diagnosis not present

## 2014-04-08 DIAGNOSIS — H25012 Cortical age-related cataract, left eye: Secondary | ICD-10-CM | POA: Diagnosis not present

## 2014-04-08 DIAGNOSIS — H2511 Age-related nuclear cataract, right eye: Secondary | ICD-10-CM | POA: Diagnosis not present

## 2014-04-10 DIAGNOSIS — J449 Chronic obstructive pulmonary disease, unspecified: Secondary | ICD-10-CM | POA: Diagnosis not present

## 2014-04-18 ENCOUNTER — Telehealth: Payer: Self-pay | Admitting: Internal Medicine

## 2014-04-18 NOTE — Telephone Encounter (Signed)
LMOM for patient with 04/19/14 appt. I asked patient to callback to confirm appt date and time.

## 2014-04-18 NOTE — Telephone Encounter (Signed)
Please call pt and schedule appointment, can not refill without being evaluated. Thanks

## 2014-04-18 NOTE — Telephone Encounter (Signed)
Patient would like a re-fill on predniSONE (DELTASONE) 20 MG tablet. She states her COPD is acting up.  She would like a callback to know if the re-fill can be made or if she has to make an appointment.   [facility] - [place], [place] - [address]

## 2014-04-19 ENCOUNTER — Ambulatory Visit (INDEPENDENT_AMBULATORY_CARE_PROVIDER_SITE_OTHER): Payer: Medicare Other | Admitting: Family Medicine

## 2014-04-19 ENCOUNTER — Encounter: Payer: Self-pay | Admitting: Family Medicine

## 2014-04-19 VITALS — BP 102/58 | HR 98 | Temp 97.6°F | Ht 65.0 in | Wt 115.9 lb

## 2014-04-19 DIAGNOSIS — J019 Acute sinusitis, unspecified: Secondary | ICD-10-CM | POA: Diagnosis not present

## 2014-04-19 DIAGNOSIS — J431 Panlobular emphysema: Secondary | ICD-10-CM

## 2014-04-19 MED ORDER — PREDNISONE 20 MG PO TABS: 40.0000 mg | ORAL_TABLET | Freq: Every day | ORAL | Status: DC

## 2014-04-19 MED ORDER — DOXYCYCLINE HYCLATE 100 MG PO TABS: 100.0000 mg | ORAL_TABLET | Freq: Two times a day (BID) | ORAL | Status: DC

## 2014-04-19 NOTE — Progress Notes (Signed)
Pre visit review using our clinic review tool, if applicable. No additional management support is needed unless otherwise documented below in the visit note. 

## 2014-04-19 NOTE — Patient Instructions (Signed)
Doxycycline (the antibiotic for the sinus infection): please take one tablet twice daily for 10 days  Prednisone (steroid for the COPD): please take two tablets once daily for 3 days  Follow up if worsening or persists

## 2014-04-19 NOTE — Progress Notes (Signed)
HPI:  COPD exacerbation: -pt of Dr. Amador Cunas here for acute visit for COPD exacerbation on chronic oxygen 3L and pulmicort and duonebbs -started: about 2 weeks ago now worsening -symptoms: started with nasal congestion, sore throat, cough - but now with thicker increased sputum and SOB, sinus pain and pressure and HA - resolved with tylenol, worsening COPD symptoms since overdueing things over the weakend -denies:fever, NVD, tooth pain -has tried: musinex -sick contacts/travel/risks: denies flu exposure, tick exposure or or Ebola risks  ROS: See pertinent positives and negatives per HPI.  Past Medical History  Diagnosis Date  . COPD 07/13/2006  . HYPERTENSION 07/13/2006  . MENOPAUSAL SYNDROME 07/13/2006  . Other and unspecified hyperlipidemia 07/13/2006    Past Surgical History  Procedure Laterality Date  . Tonsillectomy      No family history on file.  History   Social History  . Marital Status: Married    Spouse Name: N/A  . Number of Children: N/A  . Years of Education: N/A   Social History Main Topics  . Smoking status: Former Games developer  . Smokeless tobacco: Former Neurosurgeon    Quit date: 01/05/2004  . Alcohol Use: Not on file  . Drug Use: Not on file  . Sexual Activity: Not on file   Other Topics Concern  . None   Social History Narrative     Current outpatient prescriptions:  .  acetaminophen (TYLENOL) 500 MG tablet, Take 500 mg by mouth every 6 (six) hours as needed., Disp: , Rfl:  .  budesonide (PULMICORT) 0.5 MG/2ML nebulizer solution, Take 2 mLs (0.5 mg total) by nebulization 2 (two) times daily., Disp: 60 mL, Rfl: 12 .  furosemide (LASIX) 20 MG tablet, take 1 tablet by mouth once daily, Disp: 90 tablet, Rfl: 1 .  ipratropium-albuterol (DUONEB) 0.5-2.5 (3) MG/3ML SOLN, Take 3 mLs by nebulization every 4 (four) hours as needed., Disp: , Rfl:  .  NON FORMULARY, O2 - 2-3 L/m , Disp: , Rfl:  .  simvastatin (ZOCOR) 40 MG tablet, take 1 tablet by mouth at bedtime,  Disp: 90 tablet, Rfl: 1 .  doxycycline (VIBRA-TABS) 100 MG tablet, Take 1 tablet (100 mg total) by mouth 2 (two) times daily., Disp: 20 tablet, Rfl: 0 .  predniSONE (DELTASONE) 20 MG tablet, Take 2 tablets (40 mg total) by mouth daily with breakfast., Disp: 6 tablet, Rfl: 0  EXAM:  Filed Vitals:   04/19/14 1255  BP: 102/58  Pulse: 98  Temp: 97.6 F (36.4 C)    Body mass index is 19.29 kg/(m^2).  GENERAL: vitals reviewed and listed above, alert, oriented, appears well hydrated and in no acute distress  HEENT: atraumatic, conjunttiva clear, no obvious abnormalities on inspection of external nose and ears, normal appearance of ear canals and TMs, thick nasal congestion, mild post oropharyngeal erythema with PND, no tonsillar edema or exudate, no sinus TTP  NECK: no obvious masses on inspection, no meningeal signs  LUNGS: prolonged expiration, no signs of resp distress, O2 97 when breaths through nose on her home O2, scattered wheezes  CV: HRRR, no peripheral edema  MS: moves all extremities without noticeable abnormality  PSYCH: pleasant and cooperative, no obvious depression or anxiety  ASSESSMENT AND PLAN:  Discussed the following assessment and plan:  Panlobular emphysema - Plan: predniSONE (DELTASONE) 20 MG tablet  Acute sinusitis, recurrence not specified, unspecified location - Plan: doxycycline (VIBRA-TABS) 100 MG tablet  -doxy, prednisone - risks discussed -return and emergency precuations -of course, we advised to  return or notify a doctor immediately if symptoms worsen or persist or new concerns arise.    Patient Instructions  Doxycycline (the antibiotic for the sinus infection): please take one tablet twice daily for 10 days  Prednisone (steroid for the COPD): please take two tablets once daily for 3 days  Follow up if worsening or persists     Arvid Marengo R.

## 2014-05-03 DIAGNOSIS — J449 Chronic obstructive pulmonary disease, unspecified: Secondary | ICD-10-CM | POA: Diagnosis not present

## 2014-05-10 DIAGNOSIS — J449 Chronic obstructive pulmonary disease, unspecified: Secondary | ICD-10-CM | POA: Diagnosis not present

## 2014-05-29 ENCOUNTER — Telehealth: Payer: Self-pay | Admitting: Family Medicine

## 2014-05-29 DIAGNOSIS — Z1239 Encounter for other screening for malignant neoplasm of breast: Secondary | ICD-10-CM

## 2014-05-29 NOTE — Telephone Encounter (Signed)
Spoke to the pt.  She has agreed to proceed with mammogram.  Order placed in the system.  She was notified that she will be contacted for an appt.

## 2014-06-04 DIAGNOSIS — J449 Chronic obstructive pulmonary disease, unspecified: Secondary | ICD-10-CM | POA: Diagnosis not present

## 2014-06-10 DIAGNOSIS — J449 Chronic obstructive pulmonary disease, unspecified: Secondary | ICD-10-CM | POA: Diagnosis not present

## 2014-06-11 ENCOUNTER — Other Ambulatory Visit: Payer: Self-pay | Admitting: Internal Medicine

## 2014-07-02 DIAGNOSIS — J449 Chronic obstructive pulmonary disease, unspecified: Secondary | ICD-10-CM | POA: Diagnosis not present

## 2014-07-09 DIAGNOSIS — H25011 Cortical age-related cataract, right eye: Secondary | ICD-10-CM | POA: Diagnosis not present

## 2014-07-09 DIAGNOSIS — H2512 Age-related nuclear cataract, left eye: Secondary | ICD-10-CM | POA: Diagnosis not present

## 2014-07-09 DIAGNOSIS — H25012 Cortical age-related cataract, left eye: Secondary | ICD-10-CM | POA: Diagnosis not present

## 2014-07-10 DIAGNOSIS — H25041 Posterior subcapsular polar age-related cataract, right eye: Secondary | ICD-10-CM | POA: Diagnosis not present

## 2014-07-10 DIAGNOSIS — H25011 Cortical age-related cataract, right eye: Secondary | ICD-10-CM | POA: Diagnosis not present

## 2014-07-10 DIAGNOSIS — J449 Chronic obstructive pulmonary disease, unspecified: Secondary | ICD-10-CM | POA: Diagnosis not present

## 2014-07-10 DIAGNOSIS — H2511 Age-related nuclear cataract, right eye: Secondary | ICD-10-CM | POA: Diagnosis not present

## 2014-07-16 DIAGNOSIS — H2511 Age-related nuclear cataract, right eye: Secondary | ICD-10-CM | POA: Diagnosis not present

## 2014-07-29 DIAGNOSIS — J449 Chronic obstructive pulmonary disease, unspecified: Secondary | ICD-10-CM | POA: Diagnosis not present

## 2014-08-01 ENCOUNTER — Other Ambulatory Visit: Payer: Self-pay | Admitting: Internal Medicine

## 2014-08-10 DIAGNOSIS — J449 Chronic obstructive pulmonary disease, unspecified: Secondary | ICD-10-CM | POA: Diagnosis not present

## 2014-08-29 DIAGNOSIS — J449 Chronic obstructive pulmonary disease, unspecified: Secondary | ICD-10-CM | POA: Diagnosis not present

## 2014-09-03 ENCOUNTER — Encounter (HOSPITAL_COMMUNITY): Payer: Self-pay | Admitting: Student

## 2014-09-03 ENCOUNTER — Inpatient Hospital Stay (HOSPITAL_COMMUNITY)
Admission: EM | Admit: 2014-09-03 | Discharge: 2014-09-07 | DRG: 190 | Disposition: A | Discharge status: Home Health | Payer: Medicare Other | Attending: Internal Medicine | Admitting: Internal Medicine

## 2014-09-03 ENCOUNTER — Ambulatory Visit: Payer: Medicare Other | Admitting: Internal Medicine

## 2014-09-03 ENCOUNTER — Telehealth: Payer: Self-pay | Admitting: Internal Medicine

## 2014-09-03 ENCOUNTER — Emergency Department (HOSPITAL_COMMUNITY): Payer: Medicare Other

## 2014-09-03 DIAGNOSIS — Z87891 Personal history of nicotine dependence: Secondary | ICD-10-CM | POA: Diagnosis not present

## 2014-09-03 DIAGNOSIS — I4510 Unspecified right bundle-branch block: Secondary | ICD-10-CM | POA: Diagnosis present

## 2014-09-03 DIAGNOSIS — J9621 Acute and chronic respiratory failure with hypoxia: Secondary | ICD-10-CM | POA: Diagnosis present

## 2014-09-03 DIAGNOSIS — I248 Other forms of acute ischemic heart disease: Secondary | ICD-10-CM | POA: Diagnosis present

## 2014-09-03 DIAGNOSIS — Z79899 Other long term (current) drug therapy: Secondary | ICD-10-CM | POA: Diagnosis not present

## 2014-09-03 DIAGNOSIS — J9622 Acute and chronic respiratory failure with hypercapnia: Secondary | ICD-10-CM | POA: Diagnosis present

## 2014-09-03 DIAGNOSIS — E785 Hyperlipidemia, unspecified: Secondary | ICD-10-CM | POA: Diagnosis present

## 2014-09-03 DIAGNOSIS — Z5181 Encounter for therapeutic drug level monitoring: Secondary | ICD-10-CM

## 2014-09-03 DIAGNOSIS — Z7951 Long term (current) use of inhaled steroids: Secondary | ICD-10-CM | POA: Diagnosis not present

## 2014-09-03 DIAGNOSIS — Z9981 Dependence on supplemental oxygen: Secondary | ICD-10-CM | POA: Diagnosis not present

## 2014-09-03 DIAGNOSIS — R0602 Shortness of breath: Secondary | ICD-10-CM | POA: Diagnosis not present

## 2014-09-03 DIAGNOSIS — I10 Essential (primary) hypertension: Secondary | ICD-10-CM | POA: Diagnosis present

## 2014-09-03 DIAGNOSIS — Z681 Body mass index (BMI) 19 or less, adult: Secondary | ICD-10-CM | POA: Diagnosis not present

## 2014-09-03 DIAGNOSIS — Z66 Do not resuscitate: Secondary | ICD-10-CM | POA: Diagnosis present

## 2014-09-03 DIAGNOSIS — R7989 Other specified abnormal findings of blood chemistry: Secondary | ICD-10-CM

## 2014-09-03 DIAGNOSIS — G4733 Obstructive sleep apnea (adult) (pediatric): Secondary | ICD-10-CM | POA: Diagnosis not present

## 2014-09-03 DIAGNOSIS — R079 Chest pain, unspecified: Secondary | ICD-10-CM | POA: Diagnosis not present

## 2014-09-03 DIAGNOSIS — R069 Unspecified abnormalities of breathing: Secondary | ICD-10-CM | POA: Diagnosis not present

## 2014-09-03 DIAGNOSIS — R64 Cachexia: Secondary | ICD-10-CM | POA: Diagnosis not present

## 2014-09-03 DIAGNOSIS — J431 Panlobular emphysema: Secondary | ICD-10-CM

## 2014-09-03 DIAGNOSIS — J9601 Acute respiratory failure with hypoxia: Secondary | ICD-10-CM | POA: Diagnosis not present

## 2014-09-03 DIAGNOSIS — J9600 Acute respiratory failure, unspecified whether with hypoxia or hypercapnia: Secondary | ICD-10-CM | POA: Diagnosis present

## 2014-09-03 DIAGNOSIS — J441 Chronic obstructive pulmonary disease with (acute) exacerbation: Principal | ICD-10-CM | POA: Diagnosis present

## 2014-09-03 DIAGNOSIS — I214 Non-ST elevation (NSTEMI) myocardial infarction: Secondary | ICD-10-CM | POA: Diagnosis not present

## 2014-09-03 DIAGNOSIS — E43 Unspecified severe protein-calorie malnutrition: Secondary | ICD-10-CM | POA: Diagnosis not present

## 2014-09-03 DIAGNOSIS — J9620 Acute and chronic respiratory failure, unspecified whether with hypoxia or hypercapnia: Secondary | ICD-10-CM | POA: Diagnosis present

## 2014-09-03 DIAGNOSIS — E872 Acidosis: Secondary | ICD-10-CM | POA: Diagnosis present

## 2014-09-03 DIAGNOSIS — J438 Other emphysema: Secondary | ICD-10-CM | POA: Diagnosis not present

## 2014-09-03 DIAGNOSIS — I452 Bifascicular block: Secondary | ICD-10-CM | POA: Diagnosis present

## 2014-09-03 DIAGNOSIS — I444 Left anterior fascicular block: Secondary | ICD-10-CM | POA: Diagnosis present

## 2014-09-03 DIAGNOSIS — J9602 Acute respiratory failure with hypercapnia: Secondary | ICD-10-CM

## 2014-09-03 DIAGNOSIS — J449 Chronic obstructive pulmonary disease, unspecified: Secondary | ICD-10-CM | POA: Diagnosis present

## 2014-09-03 DIAGNOSIS — I493 Ventricular premature depolarization: Secondary | ICD-10-CM | POA: Diagnosis present

## 2014-09-03 DIAGNOSIS — R0600 Dyspnea, unspecified: Secondary | ICD-10-CM | POA: Diagnosis not present

## 2014-09-03 DIAGNOSIS — E46 Unspecified protein-calorie malnutrition: Secondary | ICD-10-CM | POA: Diagnosis present

## 2014-09-03 DIAGNOSIS — R778 Other specified abnormalities of plasma proteins: Secondary | ICD-10-CM | POA: Diagnosis present

## 2014-09-03 LAB — I-STAT TROPONIN, ED: Troponin i, poc: 0.38 ng/mL (ref 0.00–0.08)

## 2014-09-03 LAB — I-STAT ARTERIAL BLOOD GAS, ED
ACID-BASE EXCESS: 1 mmol/L (ref 0.0–2.0)
Bicarbonate: 30.9 mEq/L — ABNORMAL HIGH (ref 20.0–24.0)
O2 Saturation: 96 %
PH ART: 7.222 — AB (ref 7.350–7.450)
PO2 ART: 99 mmHg (ref 80.0–100.0)
TCO2: 33 mmol/L (ref 0–100)
pCO2 arterial: 75.1 mmHg (ref 35.0–45.0)

## 2014-09-03 LAB — BLOOD GAS, ARTERIAL
ACID-BASE DEFICIT: 1.3 mmol/L (ref 0.0–2.0)
Bicarbonate: 26.7 mEq/L — ABNORMAL HIGH (ref 20.0–24.0)
Drawn by: 406621
O2 CONTENT: 0.3 L/min
O2 Saturation: 98.5 %
PATIENT TEMPERATURE: 98.6
PCO2 ART: 80.6 mmHg — AB (ref 35.0–45.0)
PO2 ART: 184 mmHg — AB (ref 80.0–100.0)
TCO2: 29.2 mmol/L (ref 0–100)
pH, Arterial: 7.147 — CL (ref 7.350–7.450)

## 2014-09-03 LAB — BASIC METABOLIC PANEL
Anion gap: 14 (ref 5–15)
BUN: 15 mg/dL (ref 6–20)
CHLORIDE: 101 mmol/L (ref 101–111)
CO2: 30 mmol/L (ref 22–32)
CREATININE: 0.73 mg/dL (ref 0.44–1.00)
Calcium: 9.4 mg/dL (ref 8.9–10.3)
GFR calc non Af Amer: >60 mL/min (ref >60)
GLUCOSE: 81 mg/dL (ref 65–99)
Potassium: 4.1 mmol/L (ref 3.5–5.1)
Sodium: 145 mmol/L (ref 135–145)

## 2014-09-03 LAB — CBC WITH DIFFERENTIAL/PLATELET
BASOS PCT: 0 % (ref 0–1)
Basophils Absolute: 0 10*3/uL (ref 0.0–0.1)
Eosinophils Absolute: 0 10*3/uL (ref 0.0–0.7)
Eosinophils Relative: 0 % (ref 0–5)
HEMATOCRIT: 44.5 % (ref 36.0–46.0)
Hemoglobin: 13.8 g/dL (ref 12.0–15.0)
LYMPHS ABS: 0.5 10*3/uL — AB (ref 0.7–4.0)
Lymphocytes Relative: 6 % — ABNORMAL LOW (ref 12–46)
MCH: 31 pg (ref 26.0–34.0)
MCHC: 31 g/dL (ref 30.0–36.0)
MCV: 100 fL (ref 78.0–100.0)
MONO ABS: 0.5 10*3/uL (ref 0.1–1.0)
Monocytes Relative: 7 % (ref 3–12)
NEUTROS ABS: 6.1 10*3/uL (ref 1.7–7.7)
Neutrophils Relative %: 87 % — ABNORMAL HIGH (ref 43–77)
Platelets: 181 10*3/uL (ref 150–400)
RBC: 4.45 MIL/uL (ref 3.87–5.11)
RDW: 12.3 % (ref 11.5–15.5)
WBC: 7 10*3/uL (ref 4.0–10.5)

## 2014-09-03 LAB — TROPONIN I
TROPONIN I: 0.38 ng/mL — AB (ref <0.031)
Troponin I: 0.47 ng/mL — ABNORMAL HIGH (ref <0.031)

## 2014-09-03 LAB — MRSA PCR SCREENING: MRSA by PCR: NEGATIVE

## 2014-09-03 LAB — BRAIN NATRIURETIC PEPTIDE: B Natriuretic Peptide: 115.6 pg/mL — ABNORMAL HIGH (ref 0.0–100.0)

## 2014-09-03 MED ORDER — METHYLPREDNISOLONE SODIUM SUCC 125 MG IJ SOLR
80.0000 mg | Freq: Three times a day (TID) | INTRAMUSCULAR | Status: DC
  Administered 2014-09-03 – 2014-09-04 (×4): 80 mg via INTRAVENOUS
  Filled 2014-09-03 (×4): qty 2

## 2014-09-03 MED ORDER — METHYLPREDNISOLONE SODIUM SUCC 125 MG IJ SOLR
125.0000 mg | Freq: Once | INTRAMUSCULAR | Status: AC
  Administered 2014-09-03: 125 mg via INTRAVENOUS
  Filled 2014-09-03: qty 2

## 2014-09-03 MED ORDER — ALBUTEROL SULFATE (2.5 MG/3ML) 0.083% IN NEBU: 5.0000 mg | INHALATION_SOLUTION | RESPIRATORY_TRACT | Status: DC | PRN

## 2014-09-03 MED ORDER — ASPIRIN 81 MG PO CHEW
81.0000 mg | CHEWABLE_TABLET | Freq: Every day | ORAL | Status: DC
  Administered 2014-09-04 – 2014-09-07 (×4): 81 mg via ORAL
  Filled 2014-09-03 (×4): qty 1

## 2014-09-03 MED ORDER — ALBUTEROL (5 MG/ML) CONTINUOUS INHALATION SOLN
10.0000 mg/h | INHALATION_SOLUTION | RESPIRATORY_TRACT | Status: AC
  Administered 2014-09-03: 10 mg/h via RESPIRATORY_TRACT
  Filled 2014-09-03: qty 20

## 2014-09-03 MED ORDER — ACETAMINOPHEN 650 MG RE SUPP: 650.0000 mg | Freq: Four times a day (QID) | RECTAL | Status: DC | PRN

## 2014-09-03 MED ORDER — ONDANSETRON HCL 4 MG/2ML IJ SOLN: 4.0000 mg | Freq: Four times a day (QID) | INTRAMUSCULAR | Status: DC | PRN

## 2014-09-03 MED ORDER — ASPIRIN 81 MG PO CHEW
324.0000 mg | CHEWABLE_TABLET | Freq: Once | ORAL | Status: AC
  Administered 2014-09-03: 324 mg via ORAL
  Filled 2014-09-03: qty 4

## 2014-09-03 MED ORDER — LORAZEPAM 2 MG/ML IJ SOLN
1.0000 mg | Freq: Once | INTRAMUSCULAR | Status: AC
  Administered 2014-09-03: 1 mg via INTRAVENOUS
  Filled 2014-09-03: qty 1

## 2014-09-03 MED ORDER — ACETAMINOPHEN 325 MG PO TABS
650.0000 mg | ORAL_TABLET | Freq: Four times a day (QID) | ORAL | Status: DC | PRN
  Administered 2014-09-04: 650 mg via ORAL
  Filled 2014-09-03: qty 2

## 2014-09-03 MED ORDER — IPRATROPIUM-ALBUTEROL 0.5-2.5 (3) MG/3ML IN SOLN
3.0000 mL | Freq: Four times a day (QID) | RESPIRATORY_TRACT | Status: DC
  Administered 2014-09-03 – 2014-09-04 (×4): 3 mL via RESPIRATORY_TRACT
  Filled 2014-09-03 (×3): qty 3

## 2014-09-03 MED ORDER — ENOXAPARIN SODIUM 40 MG/0.4ML ~~LOC~~ SOLN
40.0000 mg | SUBCUTANEOUS | Status: DC
  Administered 2014-09-03 – 2014-09-07 (×4): 40 mg via SUBCUTANEOUS
  Filled 2014-09-03 (×4): qty 0.4

## 2014-09-03 MED ORDER — ALBUTEROL SULFATE (2.5 MG/3ML) 0.083% IN NEBU
5.0000 mg | INHALATION_SOLUTION | Freq: Once | RESPIRATORY_TRACT | Status: AC
  Administered 2014-09-03: 5 mg via RESPIRATORY_TRACT
  Filled 2014-09-03: qty 6

## 2014-09-03 MED ORDER — IPRATROPIUM BROMIDE 0.02 % IN SOLN
0.5000 mg | Freq: Once | RESPIRATORY_TRACT | Status: AC
Start: 2014-09-03 — End: 2014-09-03
  Administered 2014-09-03: 0.5 mg via RESPIRATORY_TRACT
  Filled 2014-09-03: qty 2.5

## 2014-09-03 MED ORDER — ONDANSETRON HCL 4 MG PO TABS: 4.0000 mg | ORAL_TABLET | Freq: Four times a day (QID) | ORAL | Status: DC | PRN

## 2014-09-03 MED ORDER — LEVOFLOXACIN IN D5W 500 MG/100ML IV SOLN
500.0000 mg | INTRAVENOUS | Status: DC
  Administered 2014-09-03: 500 mg via INTRAVENOUS
  Filled 2014-09-03 (×2): qty 100

## 2014-09-03 NOTE — Consult Note (Signed)
Patient ID: Cathy Hensley MRN: [id number], DOB/AGE: [birth year]   Admit date: 09/03/2014   Primary Physician: [name], MD Primary Cardiologist: New   HPI: Cathy Hensley is a 74 y.o. female with past medical history of HTN, HLD, and COPD (on 3L chronic O2) who presents to the [facility] ED on 09/03/14 for increased shortness of breath over the past few days.   Patient reports over the weekend, while keeping her grandchildren, she started to experience increased shortness of breath. She reports this happens quite frequently due to her COPD but is usually relieved with rest. She tried lying down and resting over the weekend, but her shortness of breath did not improve and only continued to get worse. Yesterday, she reports having a headache which she says feels like her normal sinus headache along with some nausea. Also reports having some diaphoresis yesterday and today. She denies having any chest pain, palpitations, edema, or vomiting over the past several days.  For her worsening dyspnea, she took 6mg  Albuterol and 1mg  Atrovent at home this morning. When EMS was called, she was in respiratory distress at 60 breaths per minute and Duoneb was administered at that time. She reports the medications did not provide the amount of relief they usually do.  While talking in the ED, she continues to have labored breathing when speaking full sentences. She remains on a nasal cannula at 3L, which is her home dose. She has not adjusted this in the past few days. Her O2 stats while in the ED have been between 95-98%, with respiration rate in the 20's - 30's.  In the ED, a CXR showed COPD with biapical scarring and no acute cardiopulmonary process. A POC Troponin was found to be elevated at 0.38. No other significant lab findings thus far.  She reports having never seen a cardiologist before and denies any cardiac procedures ever being performed (echo, stress test, cardiac cath).   Family history  significant for AAA in her mother and sister and a brain aneurysm in another sister. Social history significant for 20 pack year tobacco history, but having quit in 2006.   Problem List: Past Medical History  Diagnosis Date  . COPD 07/13/2006  . HYPERTENSION 07/13/2006  . MENOPAUSAL SYNDROME 07/13/2006  . Other and unspecified hyperlipidemia 07/13/2006    Past Surgical History  Procedure Laterality Date  . Tonsillectomy        Home Medications: Prior to Admission medications   Medication Sig Start Date End Date Taking? Authorizing Provider  acetaminophen (TYLENOL) 500 MG tablet Take 500 mg by mouth every 6 (six) hours as needed.    Historical Provider, MD  budesonide (PULMICORT) 0.5 MG/2ML nebulizer solution Take 2 mLs (0.5 mg total) by nebulization 2 (two) times daily. 07/11/12   Gordy Savers, MD  doxycycline (VIBRA-TABS) 100 MG tablet Take 1 tablet (100 mg total) by mouth 2 (two) times daily. 04/19/14   Terressa Koyanagi, DO  furosemide (LASIX) 20 MG tablet take 1 tablet by mouth once daily 08/01/14   Gordy Savers, MD  ipratropium-albuterol (DUONEB) 0.5-2.5 (3) MG/3ML SOLN Take 3 mLs by nebulization every 4 (four) hours as needed.    Historical Provider, MD  NON FORMULARY O2 - 2-3 L/m     Historical Provider, MD  predniSONE (DELTASONE) 20 MG tablet Take 2 tablets (40 mg total) by mouth daily with breakfast. 04/19/14   Terressa Koyanagi, DO  simvastatin (ZOCOR) 40 MG tablet take 1 tablet by mouth at  bedtime 06/11/14   Gordy Savers, MD    Allergies: No Known Allergies  Past Medical History: Past Medical History  Diagnosis Date  . COPD 07/13/2006  . HYPERTENSION 07/13/2006  . MENOPAUSAL SYNDROME 07/13/2006  . Other and unspecified hyperlipidemia 07/13/2006     Surgical History: Past Surgical History  Procedure Laterality Date  . Tonsillectomy       Family History: Family History  Problem Relation Age of Onset  . AAA (abdominal aortic aneurysm) Mother   . AAA (abdominal aortic  aneurysm) Sister   . Aneurysm Sister     Social History: Social History   Social History  . Marital Status: Married    Spouse Name: N/A  . Number of Children: N/A  . Years of Education: N/A   Occupational History  . Not on file.   Social History Main Topics  . Smoking status: Former Smoker -- 0.50 packs/day for 40 years    Types: Cigarettes  . Smokeless tobacco: Former Neurosurgeon    Quit date: 01/05/2004  . Alcohol Use: 0.6 oz/week    1 Standard drinks or equivalent per week     Comment: 1 glass of wine weekly.  . Drug Use: No  . Sexual Activity: Not on file   Other Topics Concern  . Not on file   Social History Narrative   Lives at home with her husband. Retired.     Review of Systems General:  No chills, fever, night sweats or weight changes.  Cardiovascular:  No chest pain, edema, palpitations, or paroxysmal nocturnal dyspnea. Positive for dyspnea and orthopnea. Dermatological: No rash, lesions/masses Respiratory: No cough. Urologic: No hematuria, dysuria Abdominal:   No nausea, vomiting, diarrhea, bright red blood per rectum, melena, or hematemesis Neurologic:  No visual changes, wkns, changes in mental status. Positive for headache. All other systems reviewed and are otherwise negative except as noted above.   Physical Exam: Blood pressure 155/62, pulse 120, temperature 98.3 F (36.8 C), temperature source Oral, resp. rate 29, SpO2 98 %.  General: Well developed, well nourished,female in no acute distress. On Nasal Cannula. Head: Normocephalic, atraumatic, sclera non-icteric, no xanthomas, nares are without discharge. Dentition: good. Neck: No carotid bruits. JVD not elevated.  Lungs: Respirations rapid, without wheezes or rales. Decreased breath sounds at bases. Heart: Tachycardiac rate and regular rhythm. No S3 or S4.  No murmur, no rubs, or gallops appreciated. Abdomen: Soft, non-tender, non-distended with normoactive bowel sounds. No hepatomegaly. No  rebound/guarding. No obvious abdominal masses. Msk:  Strength and tone appear normal for age. No joint deformities or effusions. Extremities: No clubbing or cyanosis. No edema.  Distal pedal pulses are 2+ bilaterally. Neuro: Alert and oriented X 3. Moves all extremities spontaneously. No focal deficits noted. Psych:  Responds to questions appropriately with a normal affect. Skin: No rashes or lesions noted   Labs: Lab Results  Component Value Date   WBC 7.0 09/03/2014   HGB 13.8 09/03/2014   HCT 44.5 09/03/2014   MCV 100.0 09/03/2014   PLT 181 09/03/2014     Recent Labs Lab 09/03/14 1252  NA 145  K 4.1  CL 101  CO2 30  BUN 15  CREATININE 0.73  CALCIUM 9.4  GLUCOSE 81   Troponin (Point of Care Test)  Recent Labs  09/03/14 1301  TROPIPOC 0.38*    ECG: Sinus tachycardia with rate in 120's. 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  Echo: No Previous Results.  Radiology/Studies: Dg Chest Portable 1 View  09/03/2014  CLINICAL DATA:  Shortness of breath for 2 days. History of hypertension and COPD.  EXAM: PORTABLE CHEST - 1 VIEW  COMPARISON:  07/28/2006.  FINDINGS: 1314 hours. The heart size and mediastinal contours are stable. The lungs are hyperinflated with stable biapical scarring. Prominent nipple shadows noted bilaterally. No evidence of edema, confluent airspace opacity or suspicious pulmonary nodule. There is no pleural effusion or pneumothorax. The bones appear unchanged. Telemetry leads overlie the chest.  IMPRESSION: Chronic obstructive pulmonary disease with biapical scarring. No acute cardiopulmonary process.   Electronically Signed   By: Carey Bullocks M.D.   On: 09/03/2014 14:06    ASSESSMENT AND PLAN:  1. Elevated Troponin - Presenting with history of HLD, HTN, and COPD with increased SOB for the past several days, but without chest pain. Now tachycardiac with rate in the 120's. - EKG without any acute changes. RBBB present, but that was noted on previous EKG's  as well. - likely due to demand ischemia secondary to acute COPD exacerbation. - POC Troponin found to be elevated to 0.38 while in the ED. Will cycle troponin. - will obtain Echocardiogram - continue ASA and Statin. Will not start BB at this time due to COPD.  2. Elevated Blood Pressure - BP has been 151/50 - 170/66 while in the ED. - appears to have been well-controlled in the past and was 102/58 at previous office visit in 04/2014. - does take Lasix 20mg  daily for her COPD, but no other HTN medications. - consider starting HTN medication if BP remains elevated during admission.  3. HLD - continue statin therapy  4. COPD - administered Albuterol nebulizer, Atrovent, and Solu-Medrol while in the ED. - remains on 3L O2 via [place].  Signed, Wandra Mannan, PA-C 09/03/2014, 2:24 PM Pager: [phone]  Patient seen and examined and history reviewed. Agree with above findings and plan. Pleasant 74 yo WF with history of severe COPD on home oxygen presents to the ED with complaints of inability to breath. No chest pain. Minimal cough. No fever. No edema. Did not respond to inhalers. In ED still with marked wheezing and use of accessory respiratory muscles despite nebulizer therapies and steroids. No history of cardiac disease. She is tachycardic and has elevated BP related to her increased work of breathing. Normally BP well controlled. POC troponin mildly elevated at 0.38. Ecg shows sinus tachycardia with RBBB. No acute ischemic change. I think her troponin elevation is likely due to demand ischemia in setting of COPD exacerbation. Would cycle troponin levels and check Echo. Continue ASA and statin. Would avoid beta blocker due to COPD. If LV function is normal and troponin trend is flat I would not pursue further ischemic work up. Management of COPD exacerbation will be deferred to primary team.   Micheale Schlack [place], MDFACC 09/03/2014 3:14 PM

## 2014-09-03 NOTE — ED Notes (Signed)
Pt here for eval of SOB, hx of COPD. Pt sts she thinks she has a sinus infection that is causing increased respiratory distress. Pt sts "it was really bad last night, I didn't sleep." Pt had 6 mg albuterol,  atrovent at home PTA with no relief. Pt in resp distress when EMS arrived at 60 breaths pm, lung sounds diminished, wheezing. EMS gave a duoneb, 125 solumedrol.

## 2014-09-03 NOTE — H&P (Signed)
[facility] History and Physical  Chellsea Beckers ZOX:[id number] DOB: Jan 06, 1940 DOA: 09/03/2014  Referring physician: EDP PCP: [name], MD   Chief Complaint: shortness of breath  HPI: Cathy Hensley is a 74 y.o. female with past medical history of COPD on 3 L home oxygen, hypertension, dyslipidemia presents to the ER with the above complaints. Patient is in moderate respiratory distress on BiPAP most of the history is provided by her daughter at bedside who reports a sinus infection over the weekend which progressed to worsening dyspnea on exertion, chest tightness, wheezing, cough productive of white sputum. Her breathing got significantly worse last night, she has been using her nebulizers at home. She also had profuse sweating yesterday and today, she denies any fevers or chills. Her daughter tried to call her primary care doctor's office and she was supposed to see him this afternoon however since her respiratory condition deteriorated EMS was called and she was brought to the ER by ambulance. In the ER and noted to be in moderate respiratory distress, tachypnea, ABG with acute respiratory acidosis, troponin elevated at 0.38, EKG without acute changes and patient denies any chest pain. She was subsequently placed on BiPAP in the emergency room   Review of Systems: Positives bolded Constitutional:  No weight loss, night sweats, Fevers, chills, fatigue.  HEENT:  No headaches, Difficulty swallowing,Tooth/dental problems,Sore throat,  No sneezing, itching, ear ache, nasal congestion, post nasal drip,  Cardio-vascular:  No chest pain, Orthopnea, PND, swelling in lower extremities, anasarca, dizziness, palpitations  GI:  No heartburn, indigestion, abdominal pain, nausea, vomiting, diarrhea, change in bowel habits, loss of appetite  Resp:   shortness of breath with exertion or at rest. No excess mucus,  productive cough, No non-productive cough, No coughing up of blood.No change  in color of mucus.No wheezing.No chest wall deformity  Skin:  no rash or lesions.  GU:  no dysuria, change in color of urine, no urgency or frequency. No flank pain.  Musculoskeletal:  No joint pain or swelling. No decreased range of motion. No back pain.  Psych:  No change in mood or affect. No depression or anxiety. No memory loss.   Past Medical History  Diagnosis Date  . COPD 07/13/2006  . HYPERTENSION 07/13/2006  . MENOPAUSAL SYNDROME 07/13/2006  . Other and unspecified hyperlipidemia 07/13/2006   Past Surgical History  Procedure Laterality Date  . Tonsillectomy     Social History:  reports that she has quit smoking. Her smoking use included Cigarettes. She has a 20 pack-year smoking history. She quit smokeless tobacco use about 10 years ago. She reports that she drinks about 0.6 oz of alcohol per week. She reports that she does not use illicit drugs.  No Known Allergies  Family History  Problem Relation Age of Onset  . AAA (abdominal aortic aneurysm) Mother   . AAA (abdominal aortic aneurysm) Sister   . Aneurysm Sister     Prior to Admission medications   Medication Sig Start Date End Date Taking? Authorizing Provider  acetaminophen (TYLENOL) 500 MG tablet Take 500 mg by mouth every 6 (six) hours as needed.   Yes Historical Provider, MD  budesonide (PULMICORT) 0.5 MG/2ML nebulizer solution Take 2 mLs (0.5 mg total) by nebulization 2 (two) times daily. 07/11/12  Yes Gordy Savers, MD  furosemide (LASIX) 20 MG tablet take 1 tablet by mouth once daily 08/01/14  Yes Gordy Savers, MD  ipratropium-albuterol (DUONEB) 0.5-2.5 (3) MG/3ML SOLN Take 3 mLs by nebulization every 4 (four)  hours as needed.   Yes Historical Provider, MD  NON FORMULARY O2 - 2-3 L/m    Yes Historical Provider, MD  simvastatin (ZOCOR) 40 MG tablet take 1 tablet by mouth at bedtime 06/11/14  Yes Gordy Savers, MD   Physical Exam: Filed Vitals:   09/03/14 1515 09/03/14 1530 09/03/14 1545 09/03/14  1600  BP: 161/59 154/65 170/78 161/71  Pulse: 119 124 126 126  Temp:      TempSrc:      Resp: 34 29 19 34  SpO2: 99% 91% 91% 91%    Wt Readings from Last 3 Encounters:  04/19/14 52.572 kg (115 lb 14.4 oz)  01/14/14 51.347 kg (113 lb 3.2 oz)  11/02/13 51.71 kg (114 lb)    General:  Drowsy, arousible, tachypneic Eyes: PERRL, normal lids, irises & conjunctiva ENT: grossly normal hearing, lips & tongue Neck: no LAD, masses or thyromegaly Cardiovascular: RRR, no m/r/g. No LE edema. Telemetry: SR, no arrhythmias  Respiratory: poor air movt bilaterally, scattered exp wheezes Abdomen: soft, ntnd Skin: no rash or induration seen on limited exam Musculoskeletal: grossly normal tone BUE/BLE Psychiatric: grossly normal mood and affect, speech fluent and appropriate Neurologic: drowsy but non-focal.          Labs on Admission:  Basic Metabolic Panel:  Recent Labs Lab 09/03/14 1252  NA 145  K 4.1  CL 101  CO2 30  GLUCOSE 81  BUN 15  CREATININE 0.73  CALCIUM 9.4   Liver Function Tests: No results for input(s): AST, ALT, ALKPHOS, BILITOT, PROT, ALBUMIN in the last 168 hours. No results for input(s): LIPASE, AMYLASE in the last 168 hours. No results for input(s): AMMONIA in the last 168 hours. CBC:  Recent Labs Lab 09/03/14 1252  WBC 7.0  NEUTROABS 6.1  HGB 13.8  HCT 44.5  MCV 100.0  PLT 181   Cardiac Enzymes:  Recent Labs Lab 09/03/14 1506  TROPONINI 0.38*    BNP (last 3 results) No results for input(s): BNP in the last 8760 hours.  ProBNP (last 3 results) No results for input(s): PROBNP in the last 8760 hours.  CBG: No results for input(s): GLUCAP in the last 168 hours.  Radiological Exams on Admission: Dg Chest Portable 1 View  09/03/2014   CLINICAL DATA:  Shortness of breath for 2 days. History of hypertension and COPD.  EXAM: PORTABLE CHEST - 1 VIEW  COMPARISON:  07/28/2006.  FINDINGS: 1314 hours. The heart size and mediastinal contours are stable.  The lungs are hyperinflated with stable biapical scarring. Prominent nipple shadows noted bilaterally. No evidence of edema, confluent airspace opacity or suspicious pulmonary nodule. There is no pleural effusion or pneumothorax. The bones appear unchanged. Telemetry leads overlie the chest.  IMPRESSION: Chronic obstructive pulmonary disease with biapical scarring. No acute cardiopulmonary process.   Electronically Signed   By: Carey Bullocks M.D.   On: 09/03/2014 14:06    EKG: Independently reviewed. NSR, RBBB, no acute ST t wave changes  Assessment/Plan Principal Problem:   Acute respiratory with hypoxia and hypercarbia -Secondary to COPD exacerbation -Admit to stepdown, continue BiPAP -Repeat ABG in 2 hours -IV Solu-Medrol, DuoNeb nebs and Levaquin -She wishes to be a DO NOT RESUSCITATE   Elevated troponin -Suspect demand ischemia, continue aspirin -We'll cycle troponins -Check 2-D echocardiogram -Cardiology consulted     Essential hypertension -Stable not on meds -Home medicines includes Lasix, will hold this for now       COPD  -as above  Code Status: DNR  DVT Prophylaxis:lovenox Family Communication: daughter at bedside Disposition Plan: admit to SDU  Time spent:  [facility] Pager [phone]

## 2014-09-03 NOTE — ED Notes (Signed)
Upper dentures in denture cup, given to daughter at bedside.

## 2014-09-03 NOTE — ED Notes (Signed)
Respiratory at bedside, placing Bipap on pt at this time.

## 2014-09-03 NOTE — Telephone Encounter (Signed)
[facility] - Client TELEPHONE ADVICE RECORD [facility]  Patient Name: Cathy Hensley  DOB: 03/27/1940    Initial Comment Caller states mother having labored breathing   Nurse Assessment  Nurse: Laural Benes, RN, Dondra Spry Date/Time Cathy Hensley Time): 09/03/2014 11:02:16 AM  Confirm and document reason for call. If symptomatic, describe symptoms. ---Cathy Dandy has severe COPD and is having trouble breathing labored and onset two days and progressively getting worse  Has the patient traveled out of the country within the last 30 days? ---No  Does the patient require triage? ---Yes  Related visit to physician within the last 2 weeks? ---No  Does the PT have any chronic conditions? (i.e. diabetes, asthma, etc.) ---Yes  List chronic conditions. ---COPD     Guidelines    Guideline Title Affirmed Question Affirmed Notes       Final Disposition User        Comments  NOTE PLEASE NOTE SENT PATIENT TO ED PER 911 D/T SEVERE LABORED BREATHING COULD NOT WAIT UNTIL 200PM APPT WITH MD CLAMMY SWEATING STRUGGLING TO BREATH

## 2014-09-03 NOTE — ED Notes (Signed)
Attempted report 

## 2014-09-03 NOTE — ED Notes (Signed)
Cards MD at bedside

## 2014-09-03 NOTE — ED Notes (Signed)
Family at bedside. 

## 2014-09-03 NOTE — ED Notes (Signed)
Cards PA at bedside. 

## 2014-09-03 NOTE — ED Notes (Signed)
Resp called for blood gas hour neb tx.

## 2014-09-03 NOTE — ED Provider Notes (Signed)
CSN: [id number]     Arrival date & time 09/03/14  1239 History   First MD Initiated Contact with Patient 09/03/14 1242     Chief Complaint  Patient presents with  . Shortness of Breath     (Consider location/radiation/quality/duration/timing/severity/associated sxs/prior Treatment) HPI Comments: Patient is a 74 year old female with a past medical history of COPD, hypertension, hyperlipidemia, and oxygen dependence at 3L nasal cannula who presents with SOB that started 2 days ago and acutely worsened last night. Symptoms started gradually and continually worsened since the onset. Patient reports her home inhalers did not help. She had some nausea last night which has now resolved. Patient denies chest pain. No aggravating/alleviating factors. No other associated symptoms.    Past Medical History  Diagnosis Date  . COPD 07/13/2006  . HYPERTENSION 07/13/2006  . MENOPAUSAL SYNDROME 07/13/2006  . Other and unspecified hyperlipidemia 07/13/2006   Past Surgical History  Procedure Laterality Date  . Tonsillectomy     No family history on file. Social History  Substance Use Topics  . Smoking status: Former Games developer  . Smokeless tobacco: Former Neurosurgeon    Quit date: 01/05/2004  . Alcohol Use: Not on file   OB History    No data available     Review of Systems  Respiratory: Positive for shortness of breath.   Gastrointestinal: Positive for nausea.  All other systems reviewed and are negative.     Allergies  Review of patient's allergies indicates no known allergies.  Home Medications   Prior to Admission medications   Medication Sig Start Date End Date Taking? Authorizing Provider  acetaminophen (TYLENOL) 500 MG tablet Take 500 mg by mouth every 6 (six) hours as needed.    Historical Provider, MD  budesonide (PULMICORT) 0.5 MG/2ML nebulizer solution Take 2 mLs (0.5 mg total) by nebulization 2 (two) times daily. 07/11/12   Gordy Savers, MD  doxycycline (VIBRA-TABS) 100 MG tablet Take  1 tablet (100 mg total) by mouth 2 (two) times daily. 04/19/14   Terressa Koyanagi, DO  furosemide (LASIX) 20 MG tablet take 1 tablet by mouth once daily 08/01/14   Gordy Savers, MD  ipratropium-albuterol (DUONEB) 0.5-2.5 (3) MG/3ML SOLN Take 3 mLs by nebulization every 4 (four) hours as needed.    Historical Provider, MD  NON FORMULARY O2 - 2-3 L/m     Historical Provider, MD  predniSONE (DELTASONE) 20 MG tablet Take 2 tablets (40 mg total) by mouth daily with breakfast. 04/19/14   Terressa Koyanagi, DO  simvastatin (ZOCOR) 40 MG tablet take 1 tablet by mouth at bedtime 06/11/14   Gordy Savers, MD   BP 151/66 mmHg  Pulse 120  Temp(Src) 98.3 F (36.8 C) (Oral)  Resp 32  SpO2 98% Physical Exam  Constitutional: She is oriented to person, place, and time. She appears well-developed and well-nourished. No distress.  HENT:  Head: Normocephalic and atraumatic.  Eyes: Conjunctivae and EOM are normal.  Neck: Normal range of motion.  Cardiovascular: Normal rate and regular rhythm.  Exam reveals no gallop and no friction rub.   No murmur heard. No lower extremity edema or calf tenderness to palpation.   Pulmonary/Chest: Breath sounds normal. She is in respiratory distress. She exhibits no tenderness.  Diminished breath sounds bilaterally. Patient increased breathing effort. Oxygen via nasal cannula.   Abdominal: Soft. She exhibits no distension. There is no tenderness. There is no rebound.  Musculoskeletal: Normal range of motion.  Neurological: She is alert and  oriented to person, place, and time. Coordination normal.  Speech is goal-oriented. Moves limbs without ataxia.   Skin: Skin is warm and dry.  Psychiatric: She has a normal mood and affect. Her behavior is normal.  Nursing note and vitals reviewed.   ED Course  Procedures (including critical care time)  CRITICAL CARE Performed by: Emilia Beck   Total critical care time: 45 min  Critical care time was exclusive of  separately billable procedures and treating other patients.  Critical care was necessary to treat or prevent imminent or life-threatening deterioration.  Critical care was time spent personally by me on the following activities: development of treatment plan with patient and/or surrogate as well as nursing, discussions with consultants, evaluation of patient's response to treatment, examination of patient, obtaining history from patient or surrogate, ordering and performing treatments and interventions, ordering and review of laboratory studies, ordering and review of radiographic studies, pulse oximetry and re-evaluation of patient's condition.   Labs Review Labs Reviewed  CBC WITH DIFFERENTIAL/PLATELET - Abnormal; Notable for the following:    Neutrophils Relative % 87 (*)    Lymphocytes Relative 6 (*)    Lymphs Abs 0.5 (*)    All other components within normal limits  I-STAT TROPOININ, ED - Abnormal; Notable for the following:    Troponin i, poc 0.38 (*)    All other components within normal limits  BASIC METABOLIC PANEL    Imaging Review Dg Chest Portable 1 View  09/03/2014   CLINICAL DATA:  Shortness of breath for 2 days. History of hypertension and COPD.  EXAM: PORTABLE CHEST - 1 VIEW  COMPARISON:  07/28/2006.  FINDINGS: 1314 hours. The heart size and mediastinal contours are stable. The lungs are hyperinflated with stable biapical scarring. Prominent nipple shadows noted bilaterally. No evidence of edema, confluent airspace opacity or suspicious pulmonary nodule. There is no pleural effusion or pneumothorax. The bones appear unchanged. Telemetry leads overlie the chest.  IMPRESSION: Chronic obstructive pulmonary disease with biapical scarring. No acute cardiopulmonary process.   Electronically Signed   By: Carey Bullocks M.D.   On: 09/03/2014 14:06   I have personally reviewed and evaluated these images and lab results as part of my medical decision-making.   EKG  Interpretation   Date/Time:  Tuesday September 03 2014 12:42:12 EDT Ventricular Rate:  122 PR Interval:  148 QRS Duration: 134 QT Interval:  332 QTC Calculation: 473 R Axis:   -82 Text Interpretation:  Sinus tachycardia Biatrial enlargement Right bundle  branch block Left anterior fascicular block Lateral infarct , age  undetermined Abnormal ECG Interpretation limited secondary to artifact  Confirmed by KNOTT MD, Reuel Boom ([id number]) on 09/03/2014 1:34:31 PM      MDM   Final diagnoses:  COPD exacerbation  NSTEMI (non-ST elevated myocardial infarction)    1:29 PM Patient with an elevated troponin. Patient continues to have SOB with tachycardia and tachypnea. Patient will have additional  IV solumedrol and  ASA.   1:47 PM Cardiology will see the patient.   2:59 PM Patient's ABG shows acidosis. Patient will have Bipap and be admitted to stepdown.   Emilia Beck, PA-C 09/03/14 1508  Lyndal Pulley, MD 09/04/14 [phone]

## 2014-09-03 NOTE — Telephone Encounter (Signed)
Pt's daughter called and states pt was on Dr. Charm Rings schedule to be seen today a t 2:30 pm.  She states she has talked to multiple triage nurses and was told to call EMS.  EMS arrived 35 minutes ago and evluated pt and states she does need a steroid and pt should not wait until 2:30 to be treated.  Advised pt's daughter that pt was not on Dr. Charm Rings schedule but I would talk with him personally.  Per Dr.K pt should NOT wait to be treated and should go to the ER for evaluation and treatment. Pt's daughter verbalized understanding.

## 2014-09-04 ENCOUNTER — Inpatient Hospital Stay (HOSPITAL_COMMUNITY): Payer: Medicare Other

## 2014-09-04 DIAGNOSIS — I10 Essential (primary) hypertension: Secondary | ICD-10-CM

## 2014-09-04 DIAGNOSIS — J9601 Acute respiratory failure with hypoxia: Secondary | ICD-10-CM

## 2014-09-04 DIAGNOSIS — R0600 Dyspnea, unspecified: Secondary | ICD-10-CM

## 2014-09-04 DIAGNOSIS — R079 Chest pain, unspecified: Secondary | ICD-10-CM

## 2014-09-04 DIAGNOSIS — J438 Other emphysema: Secondary | ICD-10-CM

## 2014-09-04 DIAGNOSIS — E785 Hyperlipidemia, unspecified: Secondary | ICD-10-CM

## 2014-09-04 LAB — COMPREHENSIVE METABOLIC PANEL
ALT: 19 U/L (ref 14–54)
AST: 27 U/L (ref 15–41)
Albumin: 3.9 g/dL (ref 3.5–5.0)
Alkaline Phosphatase: 62 U/L (ref 38–126)
Anion gap: 14 (ref 5–15)
BUN: 17 mg/dL (ref 6–20)
CHLORIDE: 100 mmol/L — AB (ref 101–111)
CO2: 31 mmol/L (ref 22–32)
CREATININE: 0.69 mg/dL (ref 0.44–1.00)
Calcium: 9.3 mg/dL (ref 8.9–10.3)
GFR calc Af Amer: >60 mL/min (ref >60)
GLUCOSE: 133 mg/dL — AB (ref 65–99)
Potassium: 3.9 mmol/L (ref 3.5–5.1)
Sodium: 145 mmol/L (ref 135–145)
Total Bilirubin: 0.8 mg/dL (ref 0.3–1.2)
Total Protein: 6.9 g/dL (ref 6.5–8.1)

## 2014-09-04 LAB — CBC
HCT: 41.8 % (ref 36.0–46.0)
Hemoglobin: 13.1 g/dL (ref 12.0–15.0)
MCH: 30.8 pg (ref 26.0–34.0)
MCHC: 31.3 g/dL (ref 30.0–36.0)
MCV: 98.4 fL (ref 78.0–100.0)
PLATELETS: 190 10*3/uL (ref 150–400)
RBC: 4.25 MIL/uL (ref 3.87–5.11)
RDW: 12.3 % (ref 11.5–15.5)
WBC: 5 10*3/uL (ref 4.0–10.5)

## 2014-09-04 LAB — TROPONIN I: TROPONIN I: 0.43 ng/mL — AB (ref <0.031)

## 2014-09-04 MED ORDER — IPRATROPIUM-ALBUTEROL 0.5-2.5 (3) MG/3ML IN SOLN
3.0000 mL | Freq: Four times a day (QID) | RESPIRATORY_TRACT | Status: DC
  Administered 2014-09-04 – 2014-09-05 (×4): 3 mL via RESPIRATORY_TRACT
  Filled 2014-09-04 (×4): qty 3

## 2014-09-04 MED ORDER — AMLODIPINE BESYLATE 5 MG PO TABS
5.0000 mg | ORAL_TABLET | Freq: Every day | ORAL | Status: DC
  Administered 2014-09-04 – 2014-09-06 (×3): 5 mg via ORAL
  Filled 2014-09-04 (×3): qty 1

## 2014-09-04 MED ORDER — METHYLPREDNISOLONE SODIUM SUCC 125 MG IJ SOLR
60.0000 mg | Freq: Three times a day (TID) | INTRAMUSCULAR | Status: DC
  Administered 2014-09-04 – 2014-09-05 (×3): 60 mg via INTRAVENOUS
  Filled 2014-09-04 (×3): qty 2

## 2014-09-04 MED ORDER — ATORVASTATIN CALCIUM 40 MG PO TABS
40.0000 mg | ORAL_TABLET | Freq: Every day | ORAL | Status: DC
  Administered 2014-09-04 – 2014-09-06 (×3): 40 mg via ORAL
  Filled 2014-09-04 (×3): qty 1

## 2014-09-04 MED ORDER — LEVOFLOXACIN IN D5W 500 MG/100ML IV SOLN
500.0000 mg | INTRAVENOUS | Status: DC
  Administered 2014-09-05: 500 mg via INTRAVENOUS
  Filled 2014-09-04: qty 100

## 2014-09-04 MED ORDER — HYDRALAZINE HCL 10 MG PO TABS: 10.0000 mg | ORAL_TABLET | Freq: Four times a day (QID) | ORAL | Status: DC | PRN

## 2014-09-04 NOTE — Progress Notes (Signed)
Patient's vitals are stable at this time and she is not showing any apparent signs of respiratory distress. Bipap not placed on at this time per her request. RT will continue to monitor.

## 2014-09-04 NOTE — Care Management Note (Signed)
Case Management Note  Patient Details  Name: Cathy Hensley MRN: [id number] Date of Birth: 25-Sep-1940  Subjective/Objective:       Adm w resp distress, has home o2             Action/Plan: lives w fam, pcp dr Vernell Barrier   Expected Discharge Date:                  Expected Discharge Plan:  Home w Home Health Services  In-House Referral:     Discharge planning Services     Post Acute Care Choice:    Choice offered to:     DME Arranged:    DME Agency:     HH Arranged:    HH Agency:     Status of Service:     Medicare Important Message Given:    Date Medicare IM Given:    Medicare IM give by:    Date Additional Medicare IM Given:    Additional Medicare Important Message give by:     If discussed at Long Length of Stay Meetings, dates discussed:    Additional Comments: ur rview done  Hanley Hays, RN 09/04/2014, 8:14 AM

## 2014-09-04 NOTE — Progress Notes (Signed)
Subjective:  Still SOB; no chest pain  Objective:   Vital Signs : Filed Vitals:   09/04/14 0400 09/04/14 0746 09/04/14 0800 09/04/14 0836  BP: 151/71 160/73 153/74   Pulse: 112  108   Temp: 98.6 F (37 C) 98.2 F (36.8 C)    TempSrc: Axillary Axillary    Resp: 24  23   Height:      Weight:      SpO2: 93%  95% 96%    Intake/Output from previous day:  Intake/Output Summary (Last 24 hours) at 09/04/14 1047 Last data filed at 09/04/14 0500  Gross per 24 hour  Intake    100 ml  Output    800 ml  Net   -700 ml    I/O since admission:  Wt Readings from Last 3 Encounters:  09/03/14 103 lb 2.8 oz (46.8 kg)  04/19/14 115 lb 14.4 oz (52.572 kg)  01/14/14 113 lb 3.2 oz (51.347 kg)    Medications: . aspirin  81 mg Oral Daily  . enoxaparin (LOVENOX) injection  40 mg Subcutaneous Q24H  . ipratropium-albuterol  3 mL Nebulization Q6H  . levofloxacin (LEVAQUIN) IV  500 mg Intravenous Q24H  . methylPREDNISolone (SOLU-MEDROL) injection  80 mg Intravenous Q8H       Physical Exam:   General appearance: alert, cooperative and short of breath Neck: no adenopathy, no JVD, supple, symmetrical, trachea midline and thyroid not enlarged, symmetric, no tenderness/mass/nodules Lungs: very diminshed BS Heart: tachycardic 1/6 sem, split s2 Abdomen: soft, non-tender; bowel sounds normal; no masses,  no organomegaly Extremities: no ulcers, gangrene or trophic changes Skin: Skin color, texture, turgor normal. No rashes or lesions Neurologic: Grossly normal   Rate: 115  Rhythm: sinus tachycardia with PVC's  ECG (independently read by me): ST RBBB q wave avL  Lab Results:   Recent Labs  09/03/14 1252 09/04/14 0246  NA 145 145  K 4.1 3.9  CL 101 100*  CO2 30 31  GLUCOSE 81 133*  BUN 15 17  CREATININE 0.73 0.69  CALCIUM 9.4 9.3    Hepatic Function Latest Ref Rng 09/04/2014 11/05/2011 02/09/2010  Total Protein 6.5 - 8.1 g/dL 6.9 7.0 6.8  Albumin 3.5 - 5.0 g/dL 3.9 4.0 4.4    AST 15 - 41 U/L '27 24 24  ' ALT 14 - 54 U/L '19 20 20  ' Alk Phosphatase 38 - 126 U/L 62 66 51  Total Bilirubin 0.3 - 1.2 mg/dL 0.8 0.7 1.0  Bilirubin, Direct 0.0 - 0.3 mg/dL - - 0.1     Recent Labs  09/03/14 1252 09/04/14 0246  WBC 7.0 5.0  NEUTROABS 6.1  --   HGB 13.8 13.1  HCT 44.5 41.8  MCV 100.0 98.4  PLT 181 190     Recent Labs  09/03/14 1506 09/03/14 2041 09/04/14 0246  TROPONINI 0.38* 0.47* 0.43*    Lab Results  Component Value Date   TSH 0.27* 11/05/2011   No results for input(s): HGBA1C in the last 72 hours.   Recent Labs  09/04/14 0246  PROT 6.9  ALBUMIN 3.9  AST 27  ALT 19  ALKPHOS 62  BILITOT 0.8   No results for input(s): INR in the last 72 hours. BNP (last 3 results)  Recent Labs  09/03/14 1707  BNP 115.6*    ProBNP (last 3 results) No results for input(s): PROBNP in the last 8760 hours.   Lipid Panel     Component Value Date/Time   CHOL 181 11/05/2011 1011  TRIG 105.0 11/05/2011 1011   HDL 63.50 11/05/2011 1011   CHOLHDL 3 11/05/2011 1011   VLDL 21.0 11/05/2011 1011   LDLCALC 97 11/05/2011 1011   LDLDIRECT 127.9 10/19/2006 0946      Imaging:  Dg Chest Portable 1 View  09/03/2014   CLINICAL DATA:  Shortness of breath for 2 days. History of hypertension and COPD.  EXAM: PORTABLE CHEST - 1 VIEW  COMPARISON:  07/28/2006.  FINDINGS: 1314 hours. The heart size and mediastinal contours are stable. The lungs are hyperinflated with stable biapical scarring. Prominent nipple shadows noted bilaterally. No evidence of edema, confluent airspace opacity or suspicious pulmonary nodule. There is no pleural effusion or pneumothorax. The bones appear unchanged. Telemetry leads overlie the chest.  IMPRESSION: Chronic obstructive pulmonary disease with biapical scarring. No acute cardiopulmonary process.   Electronically Signed   By: Richardean Sale M.D.   On: 09/03/2014 14:06      Assessment/Plan:   Principal Problem:   Acute respiratory  failure with hypoxia and hypercarbia Active Problems:   Essential hypertension   COPD with emphysema   Dyslipidemia   Elevated troponin   Chest pain   Respiratory failure, acute   1. COPD/emphysema exacerbation 2. Mildly positive troponin:  Flat curve suggestive of demand ischemia rather than NSTEMI 3. HLD; was on simvastatin 40  at home; will change to atorva 40 mg with LDL 127 4. TSH from 2013 oversuppressed;  Will re check thyroid studies with sinus tachycardia.  Echo just completed; await results.  BNP not elevated arguing against CHF.   Troy Sine, MD, [facility] 09/04/2014, 10:47 AM

## 2014-09-04 NOTE — Progress Notes (Signed)
Placed patient on Bipap and she is tolerating it well. RT will continue to monitor.  

## 2014-09-04 NOTE — Progress Notes (Signed)
  Echocardiogram 2D Echocardiogram has been performed.  Leta Jungling M 09/04/2014, 9:46 AM

## 2014-09-04 NOTE — Progress Notes (Signed)
[place] TEAM 1 - Stepdown/ICU TEAM PROGRESS NOTE  Cathy Hensley GQQ:[id number] DOB: 1940-02-13 DOA: 09/03/2014 PCP: [name], MD  Admit HPI / Brief Narrative: 74 y.o. female with history of COPD on 3 L home oxygen, hypertension, and dyslipidemia who presented to the ER c/o SOB.  Despite using her home nebulizer she had developed severe wheezing which progressed over the 24 hours prior to her presentation.  Her symptoms began with the onset of a "sinus infection" in the 3-4 days prior to her admission.  Since her respiratory condition deteriorated EMS was called and she was brought to the ER by ambulance.  In the ER she was noted to be in moderate respiratory distress, tachypneic, and with ABG revealing acute respiratory acidosis.  Troponin was elevated at 0.38, EKG without acute changes and patient denied any chest pain.  She was placed on BiPAP in the emergency room.  HPI/Subjective: The patient feels that her breathing has improved but is not yet back to baseline.  She was able to be liberated from BiPAP earlier this morning.  She denies chest pain fevers chills nausea or vomiting.  She states she has not smoked in over 14 years.  Assessment/Plan:  Acute hypercarbic and hypoxic respiratory failure due to acute bronchospastic COPD exacerbation Slowly improving - continue current medical therapy - I discussed the benefit of being followed in the pulmonary clinic as it would provide the patient with access to an urgent COPD clinic for aggressive outpatient management - she is interested in being referred to pulmonary and we will therefore attempt to arrange an appointment prior to her discharge  Elevated troponin most consistent with demand ischemia - cardiology has evaluated  Hypertension - uncontrolled Adjust medical therapy and continue to follow  Sinus tachycardia  Likely simply related to respiratory distress - TSH pending - follow  HLD  Code Status: DNR Family  Communication: Spoke with daughter and husband at bedside Disposition Plan: SDU  Consultants: Cardiology  Procedures: None  Antibiotics: Levaquin 8/30 >  DVT prophylaxis: Lovenox  Objective: Blood pressure 170/66, pulse 108, temperature 98.3 F (36.8 C), temperature source Oral, resp. rate 27, height 5\' 7"  (1.702 m), weight 46.8 kg (103 lb 2.8 oz), SpO2 95 %.  Intake/Output Summary (Last 24 hours) at 09/04/14 1714 Last data filed at 09/04/14 1400  Gross per 24 hour  Intake    250 ml  Output   1300 ml  Net  -1050 ml   Exam: General: Modest respiratory distress with tachypnea but able to complete full sentences Lungs: No active wheeze but very poor air movement throughout all fields with no focal crackles Cardiovascular: Tachycardic but regular without appreciable murmur Abdomen: Nontender, nondistended, soft, bowel sounds positive, no rebound, no ascites, no appreciable mass Extremities: No significant cyanosis, clubbing, or edema bilateral lower extremities  Data Reviewed: Basic Metabolic Panel:  Recent Labs Lab 09/03/14 1252 09/04/14 0246  NA 145 145  K 4.1 3.9  CL 101 100*  CO2 30 31  GLUCOSE 81 133*  BUN 15 17  CREATININE 0.73 0.69  CALCIUM 9.4 9.3    CBC:  Recent Labs Lab 09/03/14 1252 09/04/14 0246  WBC 7.0 5.0  NEUTROABS 6.1  --   HGB 13.8 13.1  HCT 44.5 41.8  MCV 100.0 98.4  PLT 181 190    Liver Function Tests:  Recent Labs Lab 09/04/14 0246  AST 27  ALT 19  ALKPHOS 62  BILITOT 0.8  PROT 6.9  ALBUMIN 3.9   Cardiac Enzymes:  Recent Labs Lab 09/03/14 1506 09/03/14 2041 09/04/14 0246  TROPONINI 0.38* 0.47* 0.43*    Recent Results (from the past 240 hour(s))  MRSA PCR Screening     Status: None   Collection Time: 09/03/14  5:10 PM  Result Value Ref Range Status   MRSA by PCR NEGATIVE NEGATIVE Final    Comment:        The GeneXpert MRSA Assay (FDA approved for NASAL specimens only), is one component of a comprehensive  MRSA colonization surveillance program. It is not intended to diagnose MRSA infection nor to guide or monitor treatment for MRSA infections.      Studies:   Recent x-ray studies have been reviewed in detail by the Attending Physician  Scheduled Meds:  Scheduled Meds: . aspirin  81 mg Oral Daily  . atorvastatin  40 mg Oral q1800  . enoxaparin (LOVENOX) injection  40 mg Subcutaneous Q24H  . ipratropium-albuterol  3 mL Nebulization Q6H  . [START ON 09/05/2014] levofloxacin (LEVAQUIN) IV  500 mg Intravenous Q48H  . methylPREDNISolone (SOLU-MEDROL) injection  80 mg Intravenous Q8H    Time spent on care of this patient: 35 mins   [place]uneta Silveria T , MD   [facility] Office  [phone] Pager - Text Page per Loretha Stapler as per below:  On-Call/Text Page:      [email or web address]      password [email or web address]  If 7PM-7AM, please contact night-coverage [email or web address] Password [email or web address] 09/04/2014, 5:14 PM   LOS: 1 day

## 2014-09-05 DIAGNOSIS — G4733 Obstructive sleep apnea (adult) (pediatric): Secondary | ICD-10-CM

## 2014-09-05 DIAGNOSIS — E785 Hyperlipidemia, unspecified: Secondary | ICD-10-CM | POA: Diagnosis present

## 2014-09-05 DIAGNOSIS — E41 Nutritional marasmus: Secondary | ICD-10-CM

## 2014-09-05 DIAGNOSIS — J9620 Acute and chronic respiratory failure, unspecified whether with hypoxia or hypercapnia: Secondary | ICD-10-CM | POA: Diagnosis present

## 2014-09-05 DIAGNOSIS — J9621 Acute and chronic respiratory failure with hypoxia: Secondary | ICD-10-CM

## 2014-09-05 DIAGNOSIS — E43 Unspecified severe protein-calorie malnutrition: Secondary | ICD-10-CM | POA: Diagnosis present

## 2014-09-05 DIAGNOSIS — E46 Unspecified protein-calorie malnutrition: Secondary | ICD-10-CM | POA: Diagnosis present

## 2014-09-05 DIAGNOSIS — I471 Supraventricular tachycardia: Secondary | ICD-10-CM

## 2014-09-05 LAB — TSH: TSH: 0.19 u[IU]/mL — AB (ref 0.350–4.500)

## 2014-09-05 LAB — BLOOD GAS, ARTERIAL
Acid-Base Excess: 12.6 mmol/L — ABNORMAL HIGH (ref 0.0–2.0)
Bicarbonate: 38.3 mEq/L — ABNORMAL HIGH (ref 20.0–24.0)
Drawn by: 358491
O2 CONTENT: 6 L/min
O2 Saturation: 89.1 %
PCO2 ART: 66.7 mmHg — AB (ref 35.0–45.0)
PH ART: 7.377 (ref 7.350–7.450)
Patient temperature: 98.6
TCO2: 40.3 mmol/L (ref 0–100)
pO2, Arterial: 59.6 mmHg — ABNORMAL LOW (ref 80.0–100.0)

## 2014-09-05 LAB — BASIC METABOLIC PANEL
Anion gap: 8 (ref 5–15)
BUN: 23 mg/dL — AB (ref 6–20)
CHLORIDE: 98 mmol/L — AB (ref 101–111)
CO2: 37 mmol/L — AB (ref 22–32)
CREATININE: 0.44 mg/dL (ref 0.44–1.00)
Calcium: 9.2 mg/dL (ref 8.9–10.3)
GFR calc Af Amer: >60 mL/min (ref >60)
GFR calc non Af Amer: >60 mL/min (ref >60)
Glucose, Bld: 140 mg/dL — ABNORMAL HIGH (ref 65–99)
Potassium: 4.2 mmol/L (ref 3.5–5.1)
SODIUM: 143 mmol/L (ref 135–145)

## 2014-09-05 LAB — T4, FREE: Free T4: 0.92 ng/dL (ref 0.61–1.12)

## 2014-09-05 MED ORDER — METHYLPREDNISOLONE SODIUM SUCC 125 MG IJ SOLR
80.0000 mg | INTRAMUSCULAR | Status: DC
Start: 2014-09-06 — End: 2014-09-07
  Administered 2014-09-06: 80 mg via INTRAVENOUS
  Filled 2014-09-05: qty 2

## 2014-09-05 MED ORDER — DEXTROSE 5 % IV SOLN
1.0000 g | INTRAVENOUS | Status: DC
  Administered 2014-09-05: 1 g via INTRAVENOUS
  Filled 2014-09-05 (×2): qty 10

## 2014-09-05 MED ORDER — CETYLPYRIDINIUM CHLORIDE 0.05 % MT LIQD: 7.0000 mL | Freq: Two times a day (BID) | OROMUCOSAL | Status: DC

## 2014-09-05 MED ORDER — LEVALBUTEROL HCL 1.25 MG/0.5ML IN NEBU
1.2500 mg | INHALATION_SOLUTION | Freq: Four times a day (QID) | RESPIRATORY_TRACT | Status: DC
  Administered 2014-09-06: 1.25 mg via RESPIRATORY_TRACT
  Filled 2014-09-05: qty 0.5

## 2014-09-05 MED ORDER — MONTELUKAST SODIUM 10 MG PO TABS
10.0000 mg | ORAL_TABLET | Freq: Every day | ORAL | Status: DC
  Administered 2014-09-05 – 2014-09-07 (×2): 10 mg via ORAL
  Filled 2014-09-05 (×2): qty 1

## 2014-09-05 MED ORDER — DEXTROSE 5 % IV SOLN
500.0000 mg | INTRAVENOUS | Status: DC
  Filled 2014-09-05: qty 500

## 2014-09-05 MED ORDER — ZOLPIDEM TARTRATE 5 MG PO TABS
5.0000 mg | ORAL_TABLET | Freq: Every evening | ORAL | Status: DC | PRN
  Administered 2014-09-05: 5 mg via ORAL
  Filled 2014-09-05: qty 1

## 2014-09-05 MED ORDER — LEVALBUTEROL HCL 1.25 MG/0.5ML IN NEBU: 1.2500 mg | INHALATION_SOLUTION | Freq: Four times a day (QID) | RESPIRATORY_TRACT | Status: DC

## 2014-09-05 NOTE — Progress Notes (Signed)
ANTIBIOTIC CONSULT NOTE - INITIAL  Pharmacy Consult for Ceftriaxone Indication: CAP  No Known Allergies  Patient Measurements: Height: 5\' 7"  (170.2 cm) Weight: 103 lb 2.8 oz (46.8 kg) IBW/kg (Calculated) : 61.6  Vital Signs: Temp: 98.7 F (37.1 C) (09/01 2000) Temp Source: Oral (09/01 2000) BP: 144/65 mmHg (09/01 2000) Pulse Rate: 106 (09/01 2153) Intake/Output from previous day: 08/31 0701 - 09/01 0700 In: 350 [P.O.:350] Out: 500 [Urine:500] Intake/Output from this shift: Total I/O In: -  Out: 200 [Urine:200]  Labs:  Recent Labs  09/03/14 1252 09/04/14 0246 09/05/14 0255  WBC 7.0 5.0  --   HGB 13.8 13.1  --   PLT 181 190  --   CREATININE 0.73 0.69 0.44   Estimated Creatinine Clearance: 46.3 mL/min (by C-G formula based on Cr of 0.44). No results for input(s): VANCOTROUGH, VANCOPEAK, VANCORANDOM, GENTTROUGH, GENTPEAK, GENTRANDOM, TOBRATROUGH, TOBRAPEAK, TOBRARND, AMIKACINPEAK, AMIKACINTROU, AMIKACIN in the last 72 hours.   Microbiology: Recent Results (from the past 720 hour(s))  MRSA PCR Screening     Status: None   Collection Time: 09/03/14  5:10 PM  Result Value Ref Range Status   MRSA by PCR NEGATIVE NEGATIVE Final    Comment:        The GeneXpert MRSA Assay (FDA approved for NASAL specimens only), is one component of a comprehensive MRSA colonization surveillance program. It is not intended to diagnose MRSA infection nor to guide or monitor treatment for MRSA infections.     Medical History: Past Medical History  Diagnosis Date  . COPD 07/13/2006  . HYPERTENSION 07/13/2006  . MENOPAUSAL SYNDROME 07/13/2006  . Other and unspecified hyperlipidemia 07/13/2006   Assessment: 74 yo F presents on 8/30 with SOB. Pharmacy now consulted to start ceftriaxone for CAP. Afebrile and WBC wnl.   Goal of Therapy:  Resolution of infection  Plan:  Start ceftriaxone 1g IV Q24 Start azithromycin 500mg  IV Q24 per MD Monitor clinical picture,  F/U C&S,  LOT  Consider need to continue Levaquin   Rx will sign off as no renal adjustments necessary.  Aisley Whan J 09/05/2014,10:26 PM

## 2014-09-05 NOTE — Progress Notes (Signed)
[place] TEAM 1 - Stepdown/ICU TEAM Progress Note  Cathy Hensley JQZ:[id number] DOB: 04/15/1940 DOA: 09/03/2014 PCP: [name], MD  Admit HPI / Brief Narrative: 74 y.o. female PMHx COPD on 3 L home oxygen, hypertension, dyslipidemia,   Patient is in moderate respiratory distress on BiPAP most of the history is provided by her daughter at bedside who reports a sinus infection over the weekend which progressed to worsening dyspnea on exertion, chest tightness, wheezing, cough productive of white sputum. Her breathing got significantly worse last night, she has been using her nebulizers at home. She also had profuse sweating yesterday and today, she denies any fevers or chills. Her daughter tried to call her primary care doctor's office and she was supposed to see him this afternoon however since her respiratory condition deteriorated EMS was called and she was brought to the ER by ambulance. In the ER and noted to be in moderate respiratory distress, tachypnea, ABG with acute respiratory acidosis, troponin elevated at 0.38, EKG without acute changes and patient denies any chest pain. She was subsequently placed on BiPAP in the emergency room  HPI/Subjective: 9/1  A/O 4, Wakes gasping for air q 15-20 min until started using O2 ~ 7 yrs ago; Husnband states still has apnic spells but does not fully wake. Never seen Pulmonologist    Assessment/Plan: Acute on chronic hypercarbic and hypoxic respiratory failure due to acute bronchospastic/ COPD exacerbation - Pulmonary consult prior to discharge  -See study appointment prior to discharge -ABG improved  -Continue Solumedrol 80mg  Daily -Change Duoneb QID to Xopenex QID secondary to tachycardia  -Titrate O2 to maintain SPO2 89-93% -Singulair 10 mg daily -Start CAP antibiotics  OSA -Signs and symptoms described by patient and husband extremely suspicious for OSA but never has had sleep study -CPAP or BIPAP per Respiratory -Ambien PRN    -CSW consult patient will need CPAP/BiPAP machine on discharge; therefore will need sleep study within 30 days of discharge  Respiratory Acidosis -resolved  Elevated troponin -most consistent with demand ischemia  - cardiology has evaluated  Hypertension -continue amlodipine 5 mg daily   Sinus tachycardia  Likely simply related to respiratory distress -See acute on chronic respiratory failure - TSH low however free T4 WNL   HLD -Lipid panel pending  Malnutrition -Patient cachectic which is a bad prognostic factor in COPD patient -Nutrition consult    Code Status: FULL Family Communication: family present at time of exam Disposition Plan: Resolution COPD exacerbation    Consultants: Patrecia Pace (cardiology)   Procedure/Significant Events: 8/31 Echocardiogram; - Left ventricle: moderate LVH. LVEF= 60%-65%.    Culture 8/30 MRSA by PCR negative 9/1 sputum pending 9/1 influenza pending 9/1 strep pneumo urine antigen pending 9/1 Legionella urine antigen pending   Antibiotics: Azithromycin 9/1>> Ceftriaxone 9/1>>   DVT prophylaxis: Lovenox   Devices    LINES / TUBES:      Continuous Infusions:   Objective: VITAL SIGNS: Temp: 98.7 F (37.1 C) (09/01 2000) Temp Source: Oral (09/01 2000) BP: 144/65 mmHg (09/01 2000) Pulse Rate: 106 (09/01 2153) SPO2; FIO2:   Intake/Output Summary (Last 24 hours) at 09/05/14 2225 Last data filed at 09/05/14 2200  Gross per 24 hour  Intake    625 ml  Output    400 ml  Net    225 ml     Exam: General: A/O 4, positive acute respiratory distress, cachectic Eyes: Negative headache, eye pain, double vision,negative scleral hemorrhage ENT: Negative Runny nose, negative ear pain, negative tinnitus,  negative gingival bleeding, Neck:  Negative scars, masses, torticollis, lymphadenopathy, JVD Lungs: extremely poor air movement throughout all lung fields, mild expiratory wheezing negative crackles.  Patient breathing through pursed lips, with retraction of accessory muscles Cardiovascular: Tachycardic, Regular rhythm without murmur gallop or rub normal S1 and S2 Abdomen:negative abdominal pain, negative dysphagia, nondistended, positive soft, bowel sounds, no rebound, no ascites, no appreciable mass Extremities: No significant cyanosis, clubbing, or edema bilateral lower extremities Psychiatric:  Negative depression, negative anxiety, negative fatigue, negative mania  Neurologic:  Cranial nerves II through XII intact, tongue/uvula midline, all extremities muscle strength 5/5, sensation intact throughout, negative dysarthria, negative expressive aphasia, negative receptive aphasia.   Data Reviewed: Basic Metabolic Panel:  Recent Labs Lab 09/03/14 1252 09/04/14 0246 09/05/14 0255  NA 145 145 143  K 4.1 3.9 4.2  CL 101 100* 98*  CO2 30 31 37*  GLUCOSE 81 133* 140*  BUN 15 17 23*  CREATININE 0.73 0.69 0.44  CALCIUM 9.4 9.3 9.2   Liver Function Tests:  Recent Labs Lab 09/04/14 0246  AST 27  ALT 19  ALKPHOS 62  BILITOT 0.8  PROT 6.9  ALBUMIN 3.9   No results for input(s): LIPASE, AMYLASE in the last 168 hours. No results for input(s): AMMONIA in the last 168 hours. CBC:  Recent Labs Lab 09/03/14 1252 09/04/14 0246  WBC 7.0 5.0  NEUTROABS 6.1  --   HGB 13.8 13.1  HCT 44.5 41.8  MCV 100.0 98.4  PLT 181 190   Cardiac Enzymes:  Recent Labs Lab 09/03/14 1506 09/03/14 2041 09/04/14 0246  TROPONINI 0.38* 0.47* 0.43*   BNP (last 3 results)  Recent Labs  09/03/14 1707  BNP 115.6*    ProBNP (last 3 results) No results for input(s): PROBNP in the last 8760 hours.  CBG: No results for input(s): GLUCAP in the last 168 hours.  Recent Results (from the past 240 hour(s))  MRSA PCR Screening     Status: None   Collection Time: 09/03/14  5:10 PM  Result Value Ref Range Status   MRSA by PCR NEGATIVE NEGATIVE Final    Comment:        The GeneXpert MRSA  Assay (FDA approved for NASAL specimens only), is one component of a comprehensive MRSA colonization surveillance program. It is not intended to diagnose MRSA infection nor to guide or monitor treatment for MRSA infections.      Studies:  Recent x-ray studies have been reviewed in detail by the Attending Physician  Scheduled Meds:  Scheduled Meds: . amLODipine  5 mg Oral Daily  . antiseptic oral rinse  7 mL Mouth Rinse BID  . aspirin  81 mg Oral Daily  . atorvastatin  40 mg Oral q1800  . azithromycin  500 mg Intravenous Q24H  . enoxaparin (LOVENOX) injection  40 mg Subcutaneous Q24H  . [START ON 09/06/2014] levalbuterol  1.25 mg Nebulization 4 times per day  . levofloxacin (LEVAQUIN) IV  500 mg Intravenous Q48H  . [START ON 09/06/2014] methylPREDNISolone (SOLU-MEDROL) injection  80 mg Intravenous Q24H  . montelukast  10 mg Oral QHS    Time spent on care of this patient: 40 mins   Maidie Streight, Roselind Messier , MD  [facility] Office  [phone] Pager [phone]  On-Call/Text Page:      [email or web address]      password [email or web address]  If 7PM-7AM, please contact night-coverage [email or web address] Password [email or web address] 09/05/2014, 10:25 PM   LOS: 2 days   Care during the described time interval  was provided by me .  I have reviewed this patient's available data, including medical history, events of note, physical examination, and all test results as part of my evaluation. I have personally reviewed and interpreted all radiology studies.   Dia Crawford, MD [phone] Pager

## 2014-09-05 NOTE — Progress Notes (Signed)
Placed patient on CPAP with Bilevel setting 11.0cmH2O/6.0cmH2O with 3L O2 bled in. Patient is tolerating it well adn RT will continue to monitor.

## 2014-09-05 NOTE — Progress Notes (Signed)
Patient: Cathy Hensley / Admit Date: 09/03/2014 / Date of Encounter: 09/05/2014, 10:18 AM   Subjective: Says she's SOB. Still appears to have increased WOB on exam but she says she just had a bath and feels good now that she's clean. No chest pain reported.   Objective: Telemetry: sinus tach, occasional PVCs Physical Exam: Blood pressure 174/71, pulse 115, temperature 98.4 F (36.9 C), temperature source Oral, resp. rate 22, height 5\' 7"  (1.702 m), weight 103 lb 2.8 oz (46.8 kg), SpO2 95 %. General: Thin frail appearing WF in no acute distress Head: Normocephalic, atraumatic, sclera non-icteric, no xanthomas, nares are without discharge. Neck:JVP not elevated. Lungs: Significantly diminished throughout without wheezes, rales, or rhonchi. Breathing is unlabored. Heart: Reg rhythm, elevated rate, S1 S2 without murmurs, rubs, or gallops.  Abdomen: Soft, non-tender, non-distended with normoactive bowel sounds. No rebound/guarding. Extremities: No clubbing or cyanosis. No edema. Distal pedal pulses are 2+ and equal bilaterally. Neuro: Alert and oriented X 3. Moves all extremities spontaneously. Psych:  Responds to questions appropriately with a normal affect.   Intake/Output Summary (Last 24 hours) at 09/05/14 1018 Last data filed at 09/04/14 1800  Gross per 24 hour  Intake    350 ml  Output    500 ml  Net   -150 ml    Inpatient Medications:  . amLODipine  5 mg Oral Daily  . aspirin  81 mg Oral Daily  . atorvastatin  40 mg Oral q1800  . enoxaparin (LOVENOX) injection  40 mg Subcutaneous Q24H  . ipratropium-albuterol  3 mL Nebulization QID  . levofloxacin (LEVAQUIN) IV  500 mg Intravenous Q48H  . methylPREDNISolone (SOLU-MEDROL) injection  60 mg Intravenous Q8H   Infusions:    Labs:  Recent Labs  09/04/14 0246 09/05/14 0255  NA 145 143  K 3.9 4.2  CL 100* 98*  CO2 31 37*  GLUCOSE 133* 140*  BUN 17 23*  CREATININE 0.69 0.44  CALCIUM 9.3 9.2    Recent Labs  09/04/14 0246   AST 27  ALT 19  ALKPHOS 62  BILITOT 0.8  PROT 6.9  ALBUMIN 3.9    Recent Labs  09/03/14 1252 09/04/14 0246  WBC 7.0 5.0  NEUTROABS 6.1  --   HGB 13.8 13.1  HCT 44.5 41.8  MCV 100.0 98.4  PLT 181 190    Recent Labs  09/03/14 1506 09/03/14 2041 09/04/14 0246  TROPONINI 0.38* 0.47* 0.43*   Invalid input(s): POCBNP No results for input(s): HGBA1C in the last 72 hours.   Radiology/Studies:  Dg Chest Portable 1 View  09/03/2014   CLINICAL DATA:  Shortness of breath for 2 days. History of hypertension and COPD.  EXAM: PORTABLE CHEST - 1 VIEW  COMPARISON:  07/28/2006.  FINDINGS: 1314 hours. The heart size and mediastinal contours are stable. The lungs are hyperinflated with stable biapical scarring. Prominent nipple shadows noted bilaterally. No evidence of edema, confluent airspace opacity or suspicious pulmonary nodule. There is no pleural effusion or pneumothorax. The bones appear unchanged. Telemetry leads overlie the chest.  IMPRESSION: Chronic obstructive pulmonary disease with biapical scarring. No acute cardiopulmonary process.   Electronically Signed   By: Carey Bullocks M.D.   On: 09/03/2014 14:06     Assessment and Plan  29F with COPD/chronic respiratory failure on 3L/O2, HTN, dyslipidemia presented with acute hypercarbic/hypoxic respiratory failure with tachypnea due to bronchospastic COPD exacerbation. Cardiology consulted for elevated troponin.  1. Elevated troponin - flat curve suggestive of demand ischemia rather than NSTEMI. 2D  Echo shows mod LVH, normal EF 60-65%, no RWMA, mild MR, trivial pericardial effusion. Per prior plan, no ischemic workup given normal LV function. Would continue ASA, statin. No BB due to severe COPD. We can consider outpatient stress testing for risk stratification once she is clinically improved.  2. Sinus tachycardia - likely driven by respiratory status +/- steroids at this point. HR remains mildly elevated. TSH suppressed but free T4  normal. Will defer further management of this to IM.   3. HLD - statin titrated this admission.  4. Hypertension - BP remaining elevated. May be due in part to steroids. Outpatient BPs were on the low side. Can consider titration of amlodipine while inpatient.  SignedRonie Spies PA-C Pager: [phone]  ------------------------------------------------------------------- Echo Study Conclusions  - Left ventricle: The cavity size was normal. Wall thickness was increased in a pattern of moderate LVH. Systolic function was normal. The estimated ejection fraction was in the range of 60% to 65%. Wall motion was normal; there were no regional wall motion abnormalities. - Aortic valve: Mildly calcified annulus. - Mitral valve: There was mild regurgitation. - Pericardium, extracardiac: A trivial pericardial effusion was identified posterior to the heart.    Patient seen and examined. Agree with assessment and plan. Breathing bettr; tachycardia persists, but better. Reviewed echo data with patient. BP better at 147 systolic presently. Demand ischemia responsible for flat troponin curve. Will sign off. Call if additional cardiac issues develop.   Lennette Bihari, MD, [facility] 09/05/2014 12:26 PM

## 2014-09-05 NOTE — Evaluation (Signed)
Physical Therapy Evaluation Patient Details Name: Cathy Hensley MRN: [id number] DOB: 09-29-1940 Today's Date: 09/05/2014   History of Present Illness  Pt is a 74 y/o female with a PMH of COPD on 3L/min home O2, HTN, dyslipidemia, presents to the ED with progressive SOB. She was placed on BiPAP in the ED.  Clinical Impression  Pt admitted with above diagnosis. Pt currently with functional limitations due to the deficits listed below (see PT Problem List). At the time of PT eval pt was able to perform transfers and ambulation with mostly supervision for safety, and occasional min assist during ambulation for balance disturbances. Pt on 3L/min supplemental O2 at baseline, and although was on 5L/min in room, walked on 3L/min during gait training. Sats remained >90% for most of gait training, however at end of gait training dropped to 88%. Pt reports minimal fatigue and SOB during this time. She was returned to 5L/min supplemental O2 and sats returned to mid-high 90's within 30 seconds. Pt will benefit from skilled PT to increase their independence and safety with mobility to allow discharge to the venue listed below.       Follow Up Recommendations Home health PT;Supervision - Intermittent    Equipment Recommendations  Cane    Recommendations for Other Services       Precautions / Restrictions Precautions Precautions: Fall Restrictions Weight Bearing Restrictions: No      Mobility  Bed Mobility Overal bed mobility: Needs Assistance Bed Mobility: Supine to Sit     Supine to sit: Supervision     General bed mobility comments: No physical assist required. Supervision for safety.   Transfers Overall transfer level: Needs assistance Equipment used: None Transfers: Sit to/from Stand Sit to Stand: Supervision         General transfer comment: Supervision for safety. No physical assist required.   Ambulation/Gait Ambulation/Gait assistance: Min assist Ambulation Distance (Feet): 250  Feet Assistive device: None Gait Pattern/deviations: Step-through pattern;Decreased stride length;Trunk flexed Gait velocity: Decreased Gait velocity interpretation: Below normal speed for age/gender General Gait Details: Pt ambulating fairly well without UE support. Occasional unsteadiness noted with min assist required for recovery.   Stairs            Wheelchair Mobility    Modified Rankin (Stroke Patients Only)       Balance Overall balance assessment: Needs assistance Sitting-balance support: Feet supported;No upper extremity supported Sitting balance-Leahy Scale: Fair     Standing balance support: No upper extremity supported Standing balance-Leahy Scale: Fair Standing balance comment: Statically                             Pertinent Vitals/Pain Pain Assessment: No/denies pain    Home Living Family/patient expects to be discharged to:: Private residence Living Arrangements: Spouse/significant other Available Help at Discharge: Family;Available 24 hours/day Type of Home: House Home Access: Stairs to enter Entrance Stairs-Rails: None Entrance Stairs-Number of Steps: 3 Home Layout: Two level Home Equipment: None Additional Comments: Lives in a townhome    Prior Function Level of Independence: Independent         Comments: Driving, Able to do grocery shopping slowly     Hand Dominance   Dominant Hand: Right    Extremity/Trunk Assessment   Upper Extremity Assessment: Defer to OT evaluation           Lower Extremity Assessment: Overall WFL for tasks assessed      Cervical / Trunk Assessment: Normal  Communication   Communication: No difficulties  Cognition Arousal/Alertness: Awake/alert Behavior During Therapy: WFL for tasks assessed/performed Overall Cognitive Status: Within Functional Limits for tasks assessed                      General Comments      Exercises        Assessment/Plan    PT Assessment  Patient needs continued PT services  PT Diagnosis Difficulty walking;Generalized weakness   PT Problem List Decreased strength;Decreased range of motion;Decreased activity tolerance;Decreased balance;Decreased mobility;Decreased knowledge of use of DME;Decreased safety awareness;Decreased knowledge of precautions;Cardiopulmonary status limiting activity  PT Treatment Interventions DME instruction;Gait training;Stair training;Functional mobility training;Therapeutic activities;Therapeutic exercise;Neuromuscular re-education;Patient/family education   PT Goals (Current goals can be found in the Care Plan section) Acute Rehab PT Goals Patient Stated Goal: Return home PT Goal Formulation: With patient Time For Goal Achievement: 09/12/14 Potential to Achieve Goals: Good    Frequency Min 3X/week   Barriers to discharge        Co-evaluation               End of Session Equipment Utilized During Treatment: Gait belt;Oxygen Activity Tolerance: Patient tolerated treatment well Patient left: in chair;with call bell/phone within reach Nurse Communication: Mobility status         Time: 1411-1436 PT Time Calculation (min) (ACUTE ONLY): 25 min   Charges:   PT Evaluation $Initial PT Evaluation Tier I: 1 Procedure PT Treatments $Gait Training: 8-22 mins   PT G Codes:        Conni Slipper 2014/09/29, 3:28 PM   Conni Slipper, PT, DPT Acute Rehabilitation Services Pager: [phone]

## 2014-09-06 LAB — CBC WITH DIFFERENTIAL/PLATELET
BASOS ABS: 0 10*3/uL (ref 0.0–0.1)
BASOS PCT: 0 % (ref 0–1)
EOS PCT: 0 % (ref 0–5)
Eosinophils Absolute: 0 10*3/uL (ref 0.0–0.7)
HEMATOCRIT: 46.3 % — AB (ref 36.0–46.0)
Hemoglobin: 14.2 g/dL (ref 12.0–15.0)
LYMPHS PCT: 5 % — AB (ref 12–46)
Lymphs Abs: 0.5 10*3/uL — ABNORMAL LOW (ref 0.7–4.0)
MCH: 30.3 pg (ref 26.0–34.0)
MCHC: 30.7 g/dL (ref 30.0–36.0)
MCV: 98.9 fL (ref 78.0–100.0)
Monocytes Absolute: 1 10*3/uL (ref 0.1–1.0)
Monocytes Relative: 8 % (ref 3–12)
NEUTROS ABS: 10 10*3/uL — AB (ref 1.7–7.7)
Neutrophils Relative %: 87 % — ABNORMAL HIGH (ref 43–77)
PLATELETS: 240 10*3/uL (ref 150–400)
RBC: 4.68 MIL/uL (ref 3.87–5.11)
RDW: 12.2 % (ref 11.5–15.5)
WBC: 11.5 10*3/uL — AB (ref 4.0–10.5)

## 2014-09-06 LAB — COMPREHENSIVE METABOLIC PANEL
ALBUMIN: 3.7 g/dL (ref 3.5–5.0)
ALT: 21 U/L (ref 14–54)
AST: 20 U/L (ref 15–41)
Alkaline Phosphatase: 61 U/L (ref 38–126)
Anion gap: 4 — ABNORMAL LOW (ref 5–15)
BUN: 19 mg/dL (ref 6–20)
CHLORIDE: 99 mmol/L — AB (ref 101–111)
CO2: 41 mmol/L — ABNORMAL HIGH (ref 22–32)
CREATININE: 0.38 mg/dL — AB (ref 0.44–1.00)
Calcium: 9.2 mg/dL (ref 8.9–10.3)
GFR calc Af Amer: >60 mL/min (ref >60)
GLUCOSE: 118 mg/dL — AB (ref 65–99)
POTASSIUM: 4.4 mmol/L (ref 3.5–5.1)
Sodium: 144 mmol/L (ref 135–145)
Total Bilirubin: 0.5 mg/dL (ref 0.3–1.2)
Total Protein: 6.3 g/dL — ABNORMAL LOW (ref 6.5–8.1)

## 2014-09-06 LAB — LIPID PANEL
CHOLESTEROL: 164 mg/dL (ref 0–200)
HDL: 49 mg/dL (ref >40)
LDL Cholesterol: 88 mg/dL (ref 0–99)
TRIGLYCERIDES: 133 mg/dL (ref <150)
Total CHOL/HDL Ratio: 3.3 RATIO
VLDL: 27 mg/dL (ref 0–40)

## 2014-09-06 LAB — EXPECTORATED SPUTUM ASSESSMENT W REFEX TO RESP CULTURE

## 2014-09-06 LAB — BLOOD GAS, ARTERIAL
ACID-BASE EXCESS: 13.8 mmol/L — AB (ref 0.0–2.0)
Bicarbonate: 39.4 mEq/L — ABNORMAL HIGH (ref 20.0–24.0)
DRAWN BY: 437071
O2 Content: 3 L/min
O2 SAT: 91.2 %
PATIENT TEMPERATURE: 98.3
TCO2: 41.4 mmol/L (ref 0–100)
pCO2 arterial: 65 mmHg (ref 35.0–45.0)
pH, Arterial: 7.398 (ref 7.350–7.450)
pO2, Arterial: 62.6 mmHg — ABNORMAL LOW (ref 80.0–100.0)

## 2014-09-06 LAB — MAGNESIUM: MAGNESIUM: 2.5 mg/dL — AB (ref 1.7–2.4)

## 2014-09-06 LAB — INFLUENZA PANEL BY PCR (TYPE A & B)
H1N1FLUPCR: NOT DETECTED
INFLAPCR: NEGATIVE
INFLBPCR: NEGATIVE

## 2014-09-06 LAB — STREP PNEUMONIAE URINARY ANTIGEN: STREP PNEUMO URINARY ANTIGEN: NEGATIVE

## 2014-09-06 LAB — EXPECTORATED SPUTUM ASSESSMENT W GRAM STAIN, RFLX TO RESP C

## 2014-09-06 MED ORDER — LEVALBUTEROL HCL 1.25 MG/0.5ML IN NEBU
1.2500 mg | INHALATION_SOLUTION | Freq: Four times a day (QID) | RESPIRATORY_TRACT | Status: DC
  Administered 2014-09-06 – 2014-09-07 (×5): 1.25 mg via RESPIRATORY_TRACT
  Filled 2014-09-06 (×5): qty 0.5

## 2014-09-06 MED ORDER — TIOTROPIUM BROMIDE MONOHYDRATE 18 MCG IN CAPS
18.0000 ug | ORAL_CAPSULE | Freq: Every day | RESPIRATORY_TRACT | Status: DC
  Administered 2014-09-07: 18 ug via RESPIRATORY_TRACT
  Filled 2014-09-06: qty 5

## 2014-09-06 MED ORDER — ARFORMOTEROL TARTRATE 15 MCG/2ML IN NEBU
15.0000 ug | INHALATION_SOLUTION | Freq: Two times a day (BID) | RESPIRATORY_TRACT | Status: DC
  Administered 2014-09-07: 15 ug via RESPIRATORY_TRACT
  Filled 2014-09-06 (×5): qty 2

## 2014-09-06 MED ORDER — AMLODIPINE BESYLATE 10 MG PO TABS
10.0000 mg | ORAL_TABLET | Freq: Every day | ORAL | Status: DC
  Administered 2014-09-07: 10 mg via ORAL
  Filled 2014-09-06: qty 1

## 2014-09-06 MED ORDER — LEVOFLOXACIN IN D5W 750 MG/150ML IV SOLN
750.0000 mg | INTRAVENOUS | Status: DC
Start: 2014-09-06 — End: 2014-09-07
  Administered 2014-09-06: 750 mg via INTRAVENOUS
  Filled 2014-09-06: qty 150

## 2014-09-06 MED ORDER — ENSURE ENLIVE PO LIQD: 237.0000 mL | Freq: Three times a day (TID) | ORAL | Status: DC

## 2014-09-06 NOTE — Progress Notes (Addendum)
ANTIBIOTIC CONSULT NOTE - INITIAL  Pharmacy Consult for levaquin Indication: CAP  No Known Allergies  Patient Measurements: Height:  (170.2 cm) Weight: 103 lb 2.8 oz (46.8 kg) IBW/kg (Calculated) : 61.6  Vital Signs: Temp: 98 F (36.7 C) (09/02 1630) Temp Source: Oral (09/02 1630) BP: 145/54 mmHg (09/02 1630) Pulse Rate: 110 (09/02 1630) Intake/Output from previous day: 09/01 0701 - 09/02 0700 In: 675 [P.O.:525; IV Piggyback:150] Out: 800 [Urine:800] Intake/Output from this shift: Total I/O In: 865 [P.O.:865] Out: 975 [Urine:975]  Labs:  Recent Labs  09/04/14 0246 09/05/14 0255 09/06/14 0228  WBC 5.0  --  11.5*  HGB 13.1  --  14.2  PLT 190  --  240  CREATININE 0.69 0.44 0.38*   Estimated Creatinine Clearance: 46.3 mL/min (by C-G formula based on Cr of 0.38). No results for input(s): VANCOTROUGH, VANCOPEAK, VANCORANDOM, GENTTROUGH, GENTPEAK, GENTRANDOM, TOBRATROUGH, TOBRAPEAK, TOBRARND, AMIKACINPEAK, AMIKACINTROU, AMIKACIN in the last 72 hours.   Microbiology: Recent Results (from the past 720 hour(s))  MRSA PCR Screening     Status: None   Collection Time: 09/03/14  5:10 PM  Result Value Ref Range Status   MRSA by PCR NEGATIVE NEGATIVE Final    Comment:        The GeneXpert MRSA Assay (FDA approved for NASAL specimens only), is one component of a comprehensive MRSA colonization surveillance program. It is not intended to diagnose MRSA infection nor to guide or monitor treatment for MRSA infections.   Culture, expectorated sputum-assessment     Status: None   Collection Time: 09/06/14  4:52 PM  Result Value Ref Range Status   Specimen Description SPUTUM  Final   Special Requests NONE  Final   Sputum evaluation   Final    THIS SPECIMEN IS ACCEPTABLE. RESPIRATORY CULTURE REPORT TO FOLLOW.   Report Status 09/06/2014 FINAL  Final    Medical History: Past Medical History  Diagnosis Date  . COPD 07/13/2006  . HYPERTENSION 07/13/2006  . MENOPAUSAL  SYNDROME 07/13/2006  . Other and unspecified hyperlipidemia 07/13/2006    Medications:  Scheduled:  . [START ON 09/07/2014] amLODipine  10 mg Oral Daily  . arformoterol  15 mcg Nebulization BID  . aspirin  81 mg Oral Daily  . atorvastatin  40 mg Oral q1800  . enoxaparin (LOVENOX) injection  40 mg Subcutaneous Q24H  . feeding supplement (ENSURE ENLIVE)  237 mL Oral TID BM  . levalbuterol  1.25 mg Nebulization QID  . levofloxacin (LEVAQUIN) IV  750 mg Intravenous Q48H  . methylPREDNISolone (SOLU-MEDROL) injection  80 mg Intravenous Q24H  . montelukast  10 mg Oral QHS  . tiotropium  18 mcg Inhalation Daily    Assessment: 74 yo female here with SOB and noted with COPD exacerbation to begin levaquin. WBC= 11.5, afebrile, SCr= 0.38 and CrCl ~ 45.   9/2 levaquin>> 9/1 rocephin x1  9/2 resp  Plan:  -Levaquin  IV q48hr -Will follow renal function, cultures and clinical progress  Harland German, Pharm D 09/06/2014 6:33 PM

## 2014-09-06 NOTE — Progress Notes (Signed)
[facility] TEAM 1 - Stepdown/ICU TEAM PROGRESS NOTE  Cathy Hensley WUJ:[id number] DOB: [birth year] DOA: 09/03/2014 PCP: [name], MD  Admit HPI / Brief Narrative: 74 y.o. female with history of COPD on 3 L home oxygen, hypertension, and dyslipidemia who presented to the ER c/o SOB.  Despite using her home nebulizer she had developed severe wheezing which progressed over the 24 hours prior to her presentation.  Her symptoms began with the onset of a "sinus infection" in the 3-4 days prior to her admission.  Since her respiratory condition deteriorated EMS was called and she was brought to the ER by ambulance.  In the ER she was noted to be in moderate respiratory distress, tachypneic, and with ABG revealing acute respiratory acidosis.  Troponin was elevated at 0.38, EKG without acute changes and patient denied any chest pain.  She was placed on BiPAP in the emergency room.  HPI/Subjective: The patient continues to slowly improve but is not yet at her baseline.  She denies chest pain nausea vomiting or abdominal pain this morning.  She continues to require a higher level oxygen that she is chronically dosed at home.  Assessment/Plan:  Acute hypercarbic and hypoxic respiratory failure due to acute bronchospastic COPD exacerbation Slowly improving - I have asked pulmonary to see the patient to fine-tune her current treatment and to arrange for outpatient follow-up  ?OSA I have asked pulmonary to see her to provide a second opinion on the need for home CPAP  Elevated troponin most consistent with demand ischemia - cardiology has evaluated - no WMA on TTE w/ preserved EF  Hypertension - uncontrolled Blood pressure slightly improved - follow without change in treatment plan today  Sinus tachycardia  Likely simply related to respiratory distress - TSH low, but FT4 normal therefore no indication for tx   HLD Statin titrated this admission per Cardiology  Severe malnutrition in  context of chronic illness  Code Status: DNR Family Communication: Spoke with husband at bedside Disposition Plan: SDU  Consultants: Cardiology Pulmonary  Procedures: None  Antibiotics: Levaquin 8/30 >  DVT prophylaxis: Lovenox  Objective: Blood pressure 173/64, pulse 116, temperature 98.4 F (36.9 C), temperature source Oral, resp. rate 25, height  (1.702 m), weight 46.8 kg (103 lb 2.8 oz), SpO2 97 %.  Intake/Output Summary (Last 24 hours) at 09/06/14 0901 Last data filed at 09/06/14 0753  Gross per 24 hour  Intake    550 ml  Output   1000 ml  Net   -450 ml   Exam: General: Able to complete full sentences - no severe acute distress Lungs: Mild expiratory wheeze with improved air movement throughout all fields Cardiovascular: Tachycardic but regular without appreciable murmur Abdomen: Nontender, nondistended, soft, bowel sounds positive, no rebound, no ascites, no appreciable mass Extremities: No significant cyanosis, clubbing, edema bilateral lower extremities  Data Reviewed: Basic Metabolic Panel:  Recent Labs Lab 09/03/14 1252 09/04/14 0246 09/05/14 0255 09/06/14 0228  NA 145 145 143 144  K 4.1 3.9 4.2 4.4  CL 101 100* 98* 99*  CO2 30 31 37* 41*  GLUCOSE 81 133* 140* 118*  BUN 15 17 23* 19  CREATININE 0.73 0.69 0.44 0.38*  CALCIUM 9.4 9.3 9.2 9.2  MG  --   --   --  2.5*    CBC:  Recent Labs Lab 09/03/14 1252 09/04/14 0246 09/06/14 0228  WBC 7.0 5.0 11.5*  NEUTROABS 6.1  --  10.0*  HGB 13.8 13.1 14.2  HCT 44.5 41.8 46.3*  MCV 100.0 98.4 98.9  PLT 181 190 240    Liver Function Tests:  Recent Labs Lab 09/04/14 0246 09/06/14 0228  AST 27 20  ALT 19 21  ALKPHOS 62 61  BILITOT 0.8 0.5  PROT 6.9 6.3*  ALBUMIN 3.9 3.7   Cardiac Enzymes:  Recent Labs Lab 09/03/14 1506 09/03/14 2041 09/04/14 0246  TROPONINI 0.38* 0.47* 0.43*    Recent Results (from the past 240 hour(s))  MRSA PCR Screening     Status: None   Collection  Time: 09/03/14  5:10 PM  Result Value Ref Range Status   MRSA by PCR NEGATIVE NEGATIVE Final    Comment:        The GeneXpert MRSA Assay (FDA approved for NASAL specimens only), is one component of a comprehensive MRSA colonization surveillance program. It is not intended to diagnose MRSA infection nor to guide or monitor treatment for MRSA infections.      Studies:   Recent x-ray studies have been reviewed in detail by the Attending Physician  Scheduled Meds:  Scheduled Meds: . amLODipine  5 mg Oral Daily  . antiseptic oral rinse  7 mL Mouth Rinse BID  . aspirin  81 mg Oral Daily  . atorvastatin  40 mg Oral q1800  . azithromycin  500 mg Intravenous Q24H  . cefTRIAXone (ROCEPHIN)  IV  1 g Intravenous Q24H  . enoxaparin (LOVENOX) injection  40 mg Subcutaneous Q24H  . levalbuterol  1.25 mg Nebulization QID  . methylPREDNISolone (SOLU-MEDROL) injection  80 mg Intravenous Q24H  . montelukast  10 mg Oral QHS    Time spent on care of this patient: 35 mins   Deklin Bieler T , MD   [facility] Office  [phone] Pager - Text Page per Loretha Stapler as per below:  On-Call/Text Page:      [email or web address]      password [email or web address]  If 7PM-7AM, please contact night-coverage [email or web address] Password [email or web address] 09/06/2014, 9:01 AM   LOS: 3 days

## 2014-09-06 NOTE — Consult Note (Signed)
Name: Cathy Hensley MRN: [id number] DOB: Jul 23, 1940    ADMISSION DATE:  09/03/2014 CONSULTATION DATE:  09/06/2014  REFERRING MD :  Dolphus Jenny  CHIEF COMPLAINT:  Hypercarbic and hypoxic respiratory failure  BRIEF PATIENT DESCRIPTION: 74 year old female with PMH of COPD who does not have a pulmonologist who presents to the hospital with the chief complain of SOB.  Was diagnosed with a COPD exacerbation.  Patient was treated with bronchodilators and steroids as well as abx and was improving.  Patient asked to be seen by a pulmonologist and to be established in the [facility] pulmonary office.  Currently SOB is improving and sputum production is decreasing.  O2 dependent at home at 3L and quit smoking 14 years ago after 20 pack years of smoking.  SIGNIFICANT EVENTS  8/30 admission for COPD exacerbation.  STUDIES:  8/30 CXR that I reviewed myself with hyperinflation but no changes when compared to one from the previous year.   HISTORY OF PRESENT ILLNESS:  74 year old female with PMH of COPD who does not have a pulmonologist who presents to the hospital with the chief complain of SOB.  Was diagnosed with a COPD exacerbation.  Patient was treated with bronchodilators and steroids as well as abx and was improving.  Patient asked to be seen by a pulmonologist and to be established in the [facility] pulmonary office.  Currently SOB is improving and sputum production is decreasing.  PAST MEDICAL HISTORY :   has a past medical history of COPD (07/13/2006); HYPERTENSION (07/13/2006); MENOPAUSAL SYNDROME (07/13/2006); and Other and unspecified hyperlipidemia (07/13/2006).  has past surgical history that includes Tonsillectomy. Prior to Admission medications   Medication Sig Start Date End Date Taking? Authorizing Provider  acetaminophen (TYLENOL) 500 MG tablet Take 500 mg by mouth every 6 (six) hours as needed.   Yes Historical Provider, MD  budesonide (PULMICORT) 0.5 MG/2ML nebulizer solution Take 2 mLs (0.5 mg total) by  nebulization 2 (two) times daily. 07/11/12  Yes Gordy Savers, MD  furosemide (LASIX) 20 MG tablet take 1 tablet by mouth once daily 08/01/14  Yes Gordy Savers, MD  ipratropium-albuterol (DUONEB) 0.5-2.5 (3) MG/3ML SOLN Take 3 mLs by nebulization every 4 (four) hours as needed.   Yes Historical Provider, MD  NON FORMULARY O2 - 2-3 L/m    Yes Historical Provider, MD  simvastatin (ZOCOR) 40 MG tablet take 1 tablet by mouth at bedtime 06/11/14  Yes Gordy Savers, MD   No Known Allergies  FAMILY HISTORY:  family history includes AAA (abdominal aortic aneurysm) in her mother and sister; Aneurysm in her sister. SOCIAL HISTORY:  reports that she has quit smoking. Her smoking use included Cigarettes. She has a 20 pack-year smoking history. She quit smokeless tobacco use about 10 years ago. She reports that she drinks about 0.6 oz of alcohol per week. She reports that she does not use illicit drugs.  REVIEW OF SYSTEMS:   Constitutional: Negative for fever, chills, weight loss, malaise/fatigue and diaphoresis.  HENT: Negative for hearing loss, ear pain, nosebleeds, congestion, sore throat, neck pain, tinnitus and ear discharge.   Eyes: Negative for blurred vision, double vision, photophobia, pain, discharge and redness.  Respiratory: Negative for cough, hemoptysis, sputum production, shortness of breath, wheezing and stridor.   Cardiovascular: Negative for chest pain, palpitations, orthopnea, claudication, leg swelling and PND.  Gastrointestinal: Negative for heartburn, nausea, vomiting, abdominal pain, diarrhea, constipation, blood in stool and melena.  Genitourinary: Negative for dysuria, urgency, frequency, hematuria and flank  pain.  Musculoskeletal: Negative for myalgias, back pain, joint pain and falls.  Skin: Negative for itching and rash.  Neurological: Negative for dizziness, tingling, tremors, sensory change, speech change, focal weakness, seizures, loss of consciousness, weakness  and headaches.  Endo/Heme/Allergies: Negative for environmental allergies and polydipsia. Does not bruise/bleed easily.  SUBJECTIVE:   VITAL SIGNS: Temp:  [97.5 F (36.4 C)-98.8 F (37.1 C)] 98.1 F (36.7 C) (09/02 1147) Pulse Rate:  [81-121] 107 (09/02 1147) Resp:  [18-31] 18 (09/02 1147) BP: (144-179)/(57-85) 147/57 mmHg (09/02 1147) SpO2:  [90 %-100 %] 99 % (09/02 1229)  PHYSICAL EXAMINATION: General:  Chronically ill appearing female in NAD. Neuro:  Alert and interactive, moving all ext to command. HEENT:  [place]/AT, PERRL, EOM-I and MMM. Cardiovascular:  RRR, Nl S1/S2, -M/R/G. Lungs:  Mild end exp wheezes noted bilaterally. Abdomen:  Soft, NT, ND and +BS. Musculoskeletal:  -edema and -tenderness. Skin:  Intact.   Recent Labs Lab 09/04/14 0246 09/05/14 0255 09/06/14 0228  NA 145 143 144  K 3.9 4.2 4.4  CL 100* 98* 99*  CO2 31 37* 41*  BUN 17 23* 19  CREATININE 0.69 0.44 0.38*  GLUCOSE 133* 140* 118*    Recent Labs Lab 09/03/14 1252 09/04/14 0246 09/06/14 0228  HGB 13.8 13.1 14.2  HCT 44.5 41.8 46.3*  WBC 7.0 5.0 11.5*  PLT 181 190 240   No results found.  Case discussed with TRH-MD.  ASSESSMENT / PLAN:  74 year old female with history consistent off COPD who no longer smokes.    Hypoxemia: due to COPD.  - Titrate O2 for sat of 88-92%.  - Treat COPD.  COPD exacerbation:  - Solumedrol 80 mg PO daily, change to prednisone PO with taper.  - Change rocephin zithromax combo to levaquin at day 5 and change it to PO.  - Agree with bronchodilators.  - Xopenex PRN.  COPD Maintenance:  - Add Spiriva.  - Upon discharge will need inhaled steroids.  - Add brovana now.  - Will need PFTs once improved, can be done as outpatient.  Concern for sleep apnea: has been on BiPAP but history and physical are not consistent.  - D/C CPAP.  - If decompensates at night then will need BiPAP.  - DNR status.  Arranged appointment as outpatient with Jeanmarie Plant, NP on  09/24/2014 at 2:30 PM.  PCCM will be available PRN.  Alyson Reedy, M.D. [facility] Pulmonary/Critical Care Medicine. Pager: [phone]. After hours pager: [phone].  09/06/2014, 1:17 PM

## 2014-09-06 NOTE — Progress Notes (Signed)
Patient has sputum collection cup at bedside. Will collect sample when she wakes up. RT will continue to monitor.

## 2014-09-06 NOTE — Progress Notes (Signed)
Initial Nutrition Assessment  DOCUMENTATION CODES:   Severe malnutrition in context of chronic illness, Underweight  INTERVENTION:   Ensure Enlive po TID, each supplement provides 350 kcal and 20 grams of protein  NUTRITION DIAGNOSIS:   Malnutrition related to chronic illness as evidenced by severe depletion of body fat, severe depletion of muscle mass.  GOAL:   Patient will meet greater than or equal to 90% of their needs  MONITOR:   PO intake, Supplement acceptance, Labs, Weight trends, TF tolerance, I & O's, Skin  REASON FOR ASSESSMENT:   Consult Assessment of nutrition requirement/status  ASSESSMENT:   Cathy Hensley is a 74 y.o. female with past medical history of COPD on 3 L home oxygen, hypertension, dyslipidemia presents to the ER with the above complaints.  Pt admitted with acute respiratory failure with hypoxia and COPD exacerbation.   Pt had just received breathing treatment and consuming an [place] prior to RD visit.   Pt reports poor appetite at baseline, which is usually worse when she is not feeling well. She feels like she has lost weight in the hospital due to eating poorly. Meal completion 50-95%, however, pt feels this is inaccurate.   Pt reports she has lost weight over the years. She has been maintaining wt of 115# over the past 1-2 years.   Nutrition-Focused physical exam completed. Findings are severe fat depletion, severe muscle depletion, and no edema.   Discussed importance of good PO intake to support healing. Pt amenable to try supplements, would like Ensure due to increased nutrient density- RD to order. Pt reports she has not tried supplements in the past, however, feels like she would benefit.   Labs reviewed: Mg: 2.5.   Diet Order:  Diet Heart Room service appropriate?: Yes; Fluid consistency:: Thin  Skin:  Reviewed, no issues  Last BM:  09/05/14  Height:   Ht Readings from Last 1 Encounters:  09/03/14  (1.702 m)    Weight:    Wt Readings from Last 1 Encounters:  09/03/14 103 lb 2.8 oz (46.8 kg)    Ideal Body Weight:  61.4 kg  BMI:  Body mass index is 16.16 kg/(m^2).  Estimated Nutritional Needs:   Kcal:  1400-1600  Protein:  60-70 grams  Fluid:  >1.4 L  EDUCATION NEEDS:   Education needs addressed  Maki Hege A. Mayford Knife, RD, LDN, CDE Pager: [phone] After hours Pager: [phone]

## 2014-09-06 NOTE — Care Management Important Message (Signed)
Important Message  Patient Details  Name: Johna Overmiller MRN: [id number] Date of Birth: [birth year]   Medicare Important Message Given:  Yes-second notification given    Yvonna Alanis 09/06/2014, 12:37 PM

## 2014-09-06 NOTE — Care Management Note (Signed)
Case Management Note  Patient Details  Name: Cathy Hensley MRN: [id number] Date of Birth: 08/18/1940  Subjective/Objective:    Adm w resp failure                Action/Plan:lives w husband, pcp dr Vernell Barrier, has home o2 w [employer]   Expected Discharge Date:                  Expected Discharge Plan:  Home w Home Health Services  In-House Referral:     Discharge planning Services  CM Consult  Post Acute Care Choice:  Home Health Choice offered to:  Patient  DME Arranged:    DME Agency:     HH Arranged:  PT HH Agency:  [facility]  Status of Service:     Medicare Important Message Given:  Yes-second notification given Date Medicare IM Given:    Medicare IM give by:    Date Additional Medicare IM Given:    Additional Medicare Important Message give by:     If discussed at Long Length of Stay Meetings, dates discussed:    Additional Comments: went over [facility]hc agency list. No pref but does not want copay for [facility]. Ref to donna w [facility] who will ck to be sure no copay for [facility]. [facility] has Psychologist, prison and probation services.  Hanley Hays, RN 09/06/2014, 1:54 PM

## 2014-09-07 MED ORDER — LEVALBUTEROL TARTRATE 45 MCG/ACT IN AERO: 2.0000 | INHALATION_SPRAY | Freq: Four times a day (QID) | RESPIRATORY_TRACT | Status: DC

## 2014-09-07 MED ORDER — ALBUTEROL SULFATE HFA 108 (90 BASE) MCG/ACT IN AERS
1.0000 | INHALATION_SPRAY | RESPIRATORY_TRACT | Status: DC | PRN
  Filled 2014-09-07: qty 6.7

## 2014-09-07 MED ORDER — ALBUTEROL SULFATE (2.5 MG/3ML) 0.083% IN NEBU: 2.5000 mg | INHALATION_SOLUTION | RESPIRATORY_TRACT | Status: DC | PRN

## 2014-09-07 MED ORDER — PREDNISONE 20 MG PO TABS
60.0000 mg | ORAL_TABLET | Freq: Once | ORAL | Status: AC
  Administered 2014-09-07: 60 mg via ORAL
  Filled 2014-09-07: qty 3

## 2014-09-07 MED ORDER — LEVALBUTEROL HCL 0.63 MG/3ML IN NEBU: 0.6300 mg | INHALATION_SOLUTION | RESPIRATORY_TRACT | Status: DC | PRN

## 2014-09-07 MED ORDER — LEVOFLOXACIN 750 MG PO TABS: 750.0000 mg | ORAL_TABLET | ORAL | Status: DC

## 2014-09-07 MED ORDER — ASPIRIN 81 MG PO CHEW: 81.0000 mg | CHEWABLE_TABLET | Freq: Every day | ORAL | Status: DC

## 2014-09-07 MED ORDER — PREDNISONE 20 MG PO TABS
ORAL_TABLET | ORAL | Status: DC
Start: 2014-09-08 — End: 2014-09-13

## 2014-09-07 MED ORDER — LEVALBUTEROL TARTRATE 45 MCG/ACT IN AERO: 1.0000 | INHALATION_SPRAY | RESPIRATORY_TRACT | Status: DC | PRN

## 2014-09-07 MED ORDER — TIOTROPIUM BROMIDE MONOHYDRATE 18 MCG IN CAPS: 18.0000 ug | ORAL_CAPSULE | Freq: Every day | RESPIRATORY_TRACT | Status: DC

## 2014-09-07 MED ORDER — SALMETEROL XINAFOATE 50 MCG/DOSE IN AEPB: 1.0000 | INHALATION_SPRAY | Freq: Two times a day (BID) | RESPIRATORY_TRACT | Status: DC

## 2014-09-07 MED ORDER — LEVALBUTEROL HCL 0.63 MG/3ML IN NEBU: 0.6300 mg | INHALATION_SOLUTION | Freq: Four times a day (QID) | RESPIRATORY_TRACT | Status: DC

## 2014-09-07 MED ORDER — BUDESONIDE 0.25 MG/2ML IN SUSP: 0.2500 mg | Freq: Two times a day (BID) | RESPIRATORY_TRACT | Status: DC

## 2014-09-07 MED ORDER — ARFORMOTEROL TARTRATE 15 MCG/2ML IN NEBU: 15.0000 ug | INHALATION_SOLUTION | Freq: Two times a day (BID) | RESPIRATORY_TRACT | Status: DC

## 2014-09-07 MED ORDER — PREDNISONE 20 MG PO TABS: 60.0000 mg | ORAL_TABLET | Freq: Every day | ORAL | Status: DC

## 2014-09-07 MED ORDER — MONTELUKAST SODIUM 10 MG PO TABS: 10.0000 mg | ORAL_TABLET | Freq: Every day | ORAL | Status: DC

## 2014-09-07 MED ORDER — AMLODIPINE BESYLATE 10 MG PO TABS: 10.0000 mg | ORAL_TABLET | Freq: Every day | ORAL | Status: DC

## 2014-09-07 NOTE — Progress Notes (Signed)
Pt family called regarding discharge medications; states brovana "cost $1300.00 and is not covered by pt's insurance; pt requesting a different antibiotic to take instead"; MD aware; orders/prescription received, pt and family called and aware of new medication at pharmacy

## 2014-09-07 NOTE — Discharge Summary (Signed)
DISCHARGE SUMMARY  Cathy Hensley  MR#: [id number]  DOB:Nov 10, 1940  Date of Admission: 09/03/2014 Date of Discharge: 09/07/2014  Attending Physician:Jaelynn Currier T  Patient's RVI:[id number] Cathy Fellers, MD  Consults: PCCM Cardiology  Disposition: D/C home   Follow-up Appts: Follow-up Information    Follow up with Hensley,[name], NP On 09/24/2014.   Specialty:  Nurse Practitioner   Why:  Appointment at 2:30 PM at [facility].   Contact information:   [address] [place] [address] [phone]       Follow up with Cathy Boga, MD In 1 week.   Specialty:  Internal Medicine   Contact information:   [address] [place] [address] [phone]      Tests Needing Follow-up: -assessment of tachycarida -assessment of HTN -assessment of O2 sat and control of COPD   Discharge Diagnoses: Acute hypercarbic and hypoxic respiratory failure due to acute bronchospastic COPD exacerbation Elevated troponin Hypertension - uncontrolled Sinus tachycardia  HLD Severe malnutrition in context of chronic illness  Initial presentation: 74 y.o. female with history of COPD on 3 L home oxygen, hypertension, and dyslipidemia who presented to the ER c/o SOB. Despite using her home nebulizer she had developed severe wheezing which progressed over the 24 hours prior to her presentation. Her symptoms began with the onset of a "sinus infection" in the 3-4 days prior to her admission. Since her respiratory condition deteriorated EMS was called and she was brought to the ER by ambulance.  In the ER she was noted to be in moderate respiratory distress, tachypneic, and with ABG revealing acute respiratory acidosis. Troponin was elevated at 0.38, EKG without acute changes and patient denied any chest pain. She was placed on BiPAP in the emergency room.  Hospital Course:  Acute hypercarbic and hypoxic respiratory failure due to acute bronchospastic COPD  exacerbation Slowly improving - I asked pulmonary to see the patient to fine-tune her current treatment and to arrange for outpatient follow-up - she is being discharged on the regimen suggested by pulmonary - she is slowly but steadily improving at the time of discharge and is highly motivated to go home  ?OSA I asked pulmonary to see her to provide a second opinion on the need for home CPAP - they did not feel that she currently has OSA and feel this could be monitored further and follow-up visits  Elevated troponin most consistent with demand ischemia - Cardiology evaluated - no WMA on TTE w/ preserved EF  Hypertension - uncontrolled Blood pressure slightly improved - follow without change in treatment plan today  Sinus tachycardia  Likely simply related to respiratory distress - TSH low, but FT4 normal therefore no indication for tx   HLD Statin titrated this admission per Cardiology  Severe malnutrition in context of chronic illness    Medication List    STOP taking these medications        furosemide 20 MG tablet  Commonly known as:  LASIX     ipratropium-albuterol 0.5-2.5 (3) MG/3ML Soln  Commonly known as:  DUONEB     NON FORMULARY      TAKE these medications        acetaminophen 500 MG tablet  Commonly known as:  TYLENOL  Take 500 mg by mouth every 6 (six) hours as needed.     amLODipine 10 MG tablet  Commonly known as:  NORVASC  Take 1 tablet (10 mg total) by mouth daily.     arformoterol 15 MCG/2ML Nebu  Commonly known as:  BROVANA  Take 2 mLs (15 mcg total) by nebulization 2 (two) times daily.     aspirin 81 MG chewable tablet  Chew 1 tablet (81 mg total) by mouth daily.     budesonide 0.5 MG/2ML nebulizer solution  Commonly known as:  PULMICORT  Take 2 mLs (0.5 mg total) by nebulization 2 (two) times daily.     levalbuterol 0.63 MG/3ML nebulizer solution  Commonly known as:  XOPENEX  Take 3 mLs (0.63 mg total) by nebulization every 3 (three)  hours as needed for wheezing.     levalbuterol 0.63 MG/3ML nebulizer solution  Commonly known as:  XOPENEX  Take 3 mLs (0.63 mg total) by nebulization every 6 (six) hours.     levofloxacin 750 MG tablet  Commonly known as:  LEVAQUIN  Take 1 tablet (750 mg total) by mouth every other day.  Start taking on:  09/08/2014     montelukast 10 MG tablet  Commonly known as:  SINGULAIR  Take 1 tablet (10 mg total) by mouth at bedtime.     predniSONE 20 MG tablet  Commonly known as:  DELTASONE  Take 3 tablets a day for 3 days beginning 09/08/2014, then take 2 tablets a day for 3 days, then take 1 tablet a day for 3 days, then take one half tablet a day for 4 days then stop  Start taking on:  09/08/2014     simvastatin 40 MG tablet  Commonly known as:  ZOCOR  take 1 tablet by mouth at bedtime     tiotropium 18 MCG inhalation capsule  Commonly known as:  SPIRIVA  Place 1 capsule (18 mcg total) into inhaler and inhale daily.        Day of Discharge BP 144/54 mmHg  Pulse 114  Temp(Src) 98.7 F (37.1 C) (Oral)  Resp 23  Ht  (1.702 m)  Wt 46.8 kg (103 lb 2.8 oz)  BMI 16.16 kg/m2  SpO2 94%  Physical Exam: General: No acute respiratory distress at rest in bed Lungs: Very poor air movement throughout all fields with no active wheeze Cardiovascular: Tachycardic at 100 bpm but regular without appreciable murmur gallop or rub Abdomen: Nontender, nondistended, soft, bowel sounds positive, no rebound, no ascites, no appreciable mass Extremities: No significant cyanosis, clubbing, or edema bilateral lower extremities  Basic Metabolic Panel:  Recent Labs Lab 09/03/14 1252 09/04/14 0246 09/05/14 0255 09/06/14 0228  NA 145 145 143 144  K 4.1 3.9 4.2 4.4  CL 101 100* 98* 99*  CO2 30 31 37* 41*  GLUCOSE 81 133* 140* 118*  BUN 15 17 23* 19  CREATININE 0.73 0.69 0.44 0.38*  CALCIUM 9.4 9.3 9.2 9.2  MG  --   --   --  2.5*    Liver Function Tests:  Recent Labs Lab 09/04/14 0246  09/06/14 0228  AST 27 20  ALT 19 21  ALKPHOS 62 61  BILITOT 0.8 0.5  PROT 6.9 6.3*  ALBUMIN 3.9 3.7   CBC:  Recent Labs Lab 09/03/14 1252 09/04/14 0246 09/06/14 0228  WBC 7.0 5.0 11.5*  NEUTROABS 6.1  --  10.0*  HGB 13.8 13.1 14.2  HCT 44.5 41.8 46.3*  MCV 100.0 98.4 98.9  PLT 181 190 240    Cardiac Enzymes:  Recent Labs Lab 09/03/14 1506 09/03/14 2041 09/04/14 0246  TROPONINI 0.38* 0.47* 0.43*    Recent Results (from the past 240 hour(s))  MRSA PCR Screening     Status: None   Collection  Time: 09/03/14  5:10 PM  Result Value Ref Range Status   MRSA by PCR NEGATIVE NEGATIVE Final    Comment:        The GeneXpert MRSA Assay (FDA approved for NASAL specimens only), is one component of a comprehensive MRSA colonization surveillance program. It is not intended to diagnose MRSA infection nor to guide or monitor treatment for MRSA infections.   Culture, respiratory (NON-Expectorated)     Status: None (Preliminary result)   Collection Time: 09/06/14  4:00 PM  Result Value Ref Range Status   Specimen Description SPUTUM  Final   Special Requests NONE  Final   Gram Stain   Final    FEW WBC PRESENT,BOTH PMN AND MONONUCLEAR RARE SQUAMOUS EPITHELIAL CELLS PRESENT NO ORGANISMS SEEN Performed at [facility]    Culture PENDING  Incomplete   Report Status PENDING  Incomplete  Culture, expectorated sputum-assessment     Status: None   Collection Time: 09/06/14  4:52 PM  Result Value Ref Range Status   Specimen Description SPUTUM  Final   Special Requests NONE  Final   Sputum evaluation   Final    THIS SPECIMEN IS ACCEPTABLE. RESPIRATORY CULTURE REPORT TO FOLLOW.   Report Status 09/06/2014 FINAL  Final      Time spent in discharge (includes decision making & examination of pt): >35 minutes  09/07/2014, 10:59 AM   Lonia Blood, MD [facility] Office  [phone] Pager [phone]  On-Call/Text Page:      [email or web address]      password  [facility]

## 2014-09-07 NOTE — Discharge Instructions (Signed)
Home oxygen instructions:  Use 3 L/m via nasal cannula during the day when awake - during the day when exerting or self you may increase to 5 L/m as required based upon shortness of breath - at night increase oxygen to 5 L/m when going to bed  Chronic Obstructive Pulmonary Disease  Chronic obstructive pulmonary disease (COPD) is a common lung condition in which airflow from the lungs is limited. COPD is a general term that can be used to describe many different lung problems that limit airflow, including both chronic bronchitis and emphysema. If you have COPD, your lung function will probably never return to normal, but there are measures you can take to improve lung function and make yourself feel better.  CAUSES   Smoking (common).   Exposure to secondhand smoke.   Genetic problems.  Chronic inflammatory lung diseases or recurrent infections. SYMPTOMS   Shortness of breath, especially with physical activity.   Deep, persistent (chronic) cough with a large amount of thick mucus.   Wheezing.   Rapid breaths (tachypnea).   Gray or bluish discoloration (cyanosis) of the skin, especially in fingers, toes, or lips.   Fatigue.   Weight loss.   Frequent infections or episodes when breathing symptoms become much worse (exacerbations).   Chest tightness. DIAGNOSIS  Your health care provider will take a medical history and perform a physical examination to make the initial diagnosis. Additional tests for COPD may include:   Lung (pulmonary) function tests.  Chest X-ray.  CT scan.  Blood tests. TREATMENT  Treatment available to help you feel better when you have COPD includes:   Inhaler and nebulizer medicines. These help manage the symptoms of COPD and make your breathing more comfortable.  Supplemental oxygen. Supplemental oxygen is only helpful if you have a low oxygen level in your blood.   Exercise and physical activity. These are beneficial for nearly all  people with COPD. Some people may also benefit from a pulmonary rehabilitation program. HOME CARE INSTRUCTIONS   Take all medicines (inhaled or pills) as directed by your health care provider.  Avoid over-the-counter medicines or cough syrups that dry up your airway (such as antihistamines) and slow down the elimination of secretions unless instructed otherwise by your health care provider.   If you are a smoker, the most important thing that you can do is stop smoking. Continuing to smoke will cause further lung damage and breathing trouble. Ask your health care provider for help with quitting smoking. He or she can direct you to community resources or hospitals that provide support.  Avoid exposure to irritants such as smoke, chemicals, and fumes that aggravate your breathing.  Use oxygen therapy and pulmonary rehabilitation if directed by your health care provider. If you require home oxygen therapy, ask your health care provider whether you should purchase a pulse oximeter to measure your oxygen level at home.   Avoid contact with individuals who have a contagious illness.  Avoid extreme temperature and humidity changes.  Eat healthy foods. Eating smaller, more frequent meals and resting before meals may help you maintain your strength.  Stay active, but balance activity with periods of rest. Exercise and physical activity will help you maintain your ability to do things you want to do.  Preventing infection and hospitalization is very important when you have COPD. Make sure to receive all the vaccines your health care provider recommends, especially the pneumococcal and influenza vaccines. Ask your health care provider whether you need a pneumonia vaccine.  Learn and use relaxation techniques to manage stress.  Learn and use controlled breathing techniques as directed by your health care provider. Controlled breathing techniques include:   Pursed lip breathing. Start by breathing  in (inhaling) through your nose for 1 second. Then, purse your lips as if you were going to whistle and breathe out (exhale) through the pursed lips for 2 seconds.   Diaphragmatic breathing. Start by putting one hand on your abdomen just above your waist. Inhale slowly through your nose. The hand on your abdomen should move out. Then purse your lips and exhale slowly. You should be able to feel the hand on your abdomen moving in as you exhale.   Learn and use controlled coughing to clear mucus from your lungs. Controlled coughing is a series of short, progressive coughs. The steps of controlled coughing are:  1. Lean your head slightly forward.  2. Breathe in deeply using diaphragmatic breathing.  3. Try to hold your breath for 3 seconds.  4. Keep your mouth slightly open while coughing twice.  5. Spit any mucus out into a tissue.  6. Rest and repeat the steps once or twice as needed. SEEK MEDICAL CARE IF:   You are coughing up more mucus than usual.   There is a change in the color or thickness of your mucus.   Your breathing is more labored than usual.   Your breathing is faster than usual.  SEEK IMMEDIATE MEDICAL CARE IF:   You have shortness of breath while you are resting.   You have shortness of breath that prevents you from:  Being able to talk.   Performing your usual physical activities.   You have chest pain lasting longer than 5 minutes.   Your skin color is more cyanotic than usual.  You measure low oxygen saturations for longer than 5 minutes with a pulse oximeter. MAKE SURE YOU:   Understand these instructions.  Will watch your condition.  Will get help right away if you are not doing well or get worse. Document Released: 09/30/2004 Document Revised: 05/07/2013 Document Reviewed: 08/17/2012 [facility] Patient Information 2015 [place], [place]. This information is not intended to replace advice given to you by your health care provider. Make sure  you discuss any questions you have with your health care provider.

## 2014-09-09 ENCOUNTER — Telehealth: Payer: Self-pay | Admitting: Family Medicine

## 2014-09-09 DIAGNOSIS — I10 Essential (primary) hypertension: Secondary | ICD-10-CM | POA: Diagnosis not present

## 2014-09-09 DIAGNOSIS — J441 Chronic obstructive pulmonary disease with (acute) exacerbation: Secondary | ICD-10-CM | POA: Diagnosis not present

## 2014-09-09 DIAGNOSIS — J9601 Acute respiratory failure with hypoxia: Secondary | ICD-10-CM | POA: Diagnosis not present

## 2014-09-09 LAB — LEGIONELLA ANTIGEN, URINE

## 2014-09-09 LAB — CULTURE, RESPIRATORY

## 2014-09-09 LAB — CULTURE, RESPIRATORY W GRAM STAIN: Culture: NORMAL

## 2014-09-09 NOTE — Telephone Encounter (Signed)
Call from [facility], nurse Donalee Citrin. Patient just released from hospital after COPD exacerbation. She was released on numerous new meds. Nursing calling due to level 1 interaction between Amlodipine 10 mg and Simvastatin 40 mg. Order given to decrease the Simvastatin 40 mg dose to 1/2 tab daily until follow up with PMD or cardiology

## 2014-09-10 DIAGNOSIS — J449 Chronic obstructive pulmonary disease, unspecified: Secondary | ICD-10-CM | POA: Diagnosis not present

## 2014-09-11 ENCOUNTER — Telehealth: Payer: Self-pay | Admitting: *Deleted

## 2014-09-11 NOTE — Telephone Encounter (Signed)
PLEASE NOTE: All timestamps contained within this report are represented as [place] Standard Time. CONFIDENTIALTY NOTICE: This fax transmission is intended only for the addressee. It contains information that is legally privileged, confidential or otherwise protected from use or disclosure. If you are not the intended recipient, you are strictly prohibited from reviewing, disclosing, copying using or disseminating any of this information or taking any action in reliance on or regarding this information. If you have received this fax in error, please notify us immediately by telephone so that we can arrange for its return to [place]. Phone: [phone], Toll-Free: [phone], Fax: [phone] Page: 1 of 1 Call Id: [id number] [facility] Night - Client TELEPHONE ADVICE RECORD [facility] Patient Name: Cathy Hensley Gender: Female DOB: 1940/11/04 Age: 74 Y 4 M 28 D Return Phone Number: Address: City/State/Zip: [place] [facility] - Client Client Site [facility] - Night Physician [place] Contact Type Call Call Type Page Only Caller Name janet Relationship To Patient Provider Is this call to report lab results? No Return Phone Number Please choose phone number Initial Comment Caller States janet w/ [facility] regarding pt. Cathy Hensley of dr. Kirtland Bouchard. and is reporting a level 1 drug interaction. Call Back # [phone] Nurse Assessment Guidelines Guideline Title Affirmed Question Affirmed Notes Nurse Date/Time Lamount Cohen Time) Disp. Time Lamount Cohen Time) Disposition Final User 09/09/2014 1:53:07 PM Send to [facility] Paging Queue Salvatore Marvel 09/09/2014 2:01:59 PM Paged On Call to Other Provider Gasper Sells 09/09/2014 2:02:19 PM Page Completed Yes Gasper Sells After Care Instructions Given Call Event Type User Date / Time Description Paging DoctorName Phone DateTime Result/Outcome Message Type Notes Danise Edge [phone] 09/09/2014 2:01:59 PM Paged On Call to Other Provider Doctor Paged [facility]: Please call Marylu Lund at [phone] regarding M. Sullenberger. Danise Edge 09/09/2014 2:02:05 PM Paged On Call to Another Provider Message Result

## 2014-09-11 NOTE — Telephone Encounter (Signed)
FYI

## 2014-09-11 NOTE — Telephone Encounter (Signed)
Called and spoke with Marylu Lund from [facility] and she stated she ran the patient's medications through pharmacy and the Simvastatin and Amlodipine are considered a level 1 drug interaction. Level 1 drug interaction meaning patient has potential risk of Myopathy and Rabdomyolysis when medications are taken together. Patient has not had symptoms or interaction with medication, homecare is just required to notify PCP.

## 2014-09-12 DIAGNOSIS — J9601 Acute respiratory failure with hypoxia: Secondary | ICD-10-CM | POA: Diagnosis not present

## 2014-09-12 DIAGNOSIS — J441 Chronic obstructive pulmonary disease with (acute) exacerbation: Secondary | ICD-10-CM | POA: Diagnosis not present

## 2014-09-12 DIAGNOSIS — I10 Essential (primary) hypertension: Secondary | ICD-10-CM | POA: Diagnosis not present

## 2014-09-13 ENCOUNTER — Encounter: Payer: Self-pay | Admitting: Internal Medicine

## 2014-09-13 ENCOUNTER — Ambulatory Visit (INDEPENDENT_AMBULATORY_CARE_PROVIDER_SITE_OTHER): Payer: Medicare Other | Admitting: Internal Medicine

## 2014-09-13 VITALS — BP 132/60 | HR 93 | Temp 98.6°F | Resp 20 | Ht 66.0 in | Wt 103.0 lb

## 2014-09-13 DIAGNOSIS — J9622 Acute and chronic respiratory failure with hypercapnia: Secondary | ICD-10-CM

## 2014-09-13 DIAGNOSIS — J432 Centrilobular emphysema: Secondary | ICD-10-CM | POA: Diagnosis not present

## 2014-09-13 DIAGNOSIS — I10 Essential (primary) hypertension: Secondary | ICD-10-CM | POA: Diagnosis not present

## 2014-09-13 DIAGNOSIS — E43 Unspecified severe protein-calorie malnutrition: Secondary | ICD-10-CM | POA: Diagnosis not present

## 2014-09-13 MED ORDER — SIMVASTATIN 20 MG PO TABS: 20.0000 mg | ORAL_TABLET | Freq: Every day | ORAL | Status: DC

## 2014-09-13 NOTE — Progress Notes (Signed)
Subjective:    Patient ID: Cathy Hensley, female    DOB: [birth year], 74 y.o.   MRN: [id number]  HPI Date of Admission: 09/03/2014 Date of Discharge: 09/07/2014  Discharge Diagnoses: Acute hypercarbic and hypoxic respiratory failure due to acute bronchospastic COPD exacerbation Elevated troponin Hypertension - uncontrolled Sinus tachycardia  HLD Severe malnutrition in context of chronic illness     74 year old patient who has a history of advanced COPD.  She is seen today following a recent hospital discharge 6 days ago.  She was admitted for exacerbation of COPD with acute on chronic hypoxic and hypercarbic respiratory failure.  Since her discharge she has done remarkably well.  She requires continuous oxygen therapy 3 L during the day and 5 L at night.  She is scheduled for pulmonary follow-up in 10 days. Presently residing with her daughter who helps dispense her medications and nebulizer treatments.  She is active with home physical therapy. She is eating well.  Remains alert and offers no complaints today   Past Medical History  Diagnosis Date  . COPD 07/13/2006  . HYPERTENSION 07/13/2006  . MENOPAUSAL SYNDROME 07/13/2006  . Other and unspecified hyperlipidemia 07/13/2006    Social History   Social History  . Marital Status: Married    Spouse Name: N/A  . Number of Children: N/A  . Years of Education: N/A   Occupational History  . Not on file.   Social History Main Topics  . Smoking status: Former Smoker -- 0.50 packs/day for 40 years    Types: Cigarettes  . Smokeless tobacco: Former Neurosurgeon    Quit date: 01/05/2004  . Alcohol Use: 0.6 oz/week    1 Standard drinks or equivalent per week     Comment: 1 glass of wine weekly.  . Drug Use: No  . Sexual Activity: Not on file   Other Topics Concern  . Not on file   Social History Narrative   Lives at home with her husband. Retired.    Past Surgical History  Procedure Laterality Date  . Tonsillectomy      Family History    Problem Relation Age of Onset  . AAA (abdominal aortic aneurysm) Mother   . AAA (abdominal aortic aneurysm) Sister   . Aneurysm Sister     No Known Allergies  Current Outpatient Prescriptions on File Prior to Visit  Medication Sig Dispense Refill  . acetaminophen (TYLENOL) 500 MG tablet Take 500 mg by mouth every 6 (six) hours as needed.    Marland Kitchen amLODipine (NORVASC) 10 MG tablet Take 1 tablet (10 mg total) by mouth daily. 30 tablet 0  . aspirin 81 MG chewable tablet Chew 1 tablet (81 mg total) by mouth daily.    . budesonide (PULMICORT) 0.5 MG/2ML nebulizer solution Take 2 mLs (0.5 mg total) by nebulization 2 (two) times daily. 60 mL 12  . levalbuterol (XOPENEX) 0.63 MG/3ML nebulizer solution Take 3 mLs (0.63 mg total) by nebulization every 3 (three) hours as needed for wheezing. 3 mL 12  . montelukast (SINGULAIR) 10 MG tablet Take 1 tablet (10 mg total) by mouth at bedtime. 30 tablet 0  . salmeterol (SEREVENT DISKUS) 50 MCG/DOSE diskus inhaler Inhale 1 puff into the lungs 2 (two) times daily. 1 Inhaler 0  . tiotropium (SPIRIVA) 18 MCG inhalation capsule Place 1 capsule (18 mcg total) into inhaler and inhale daily. 30 capsule 12   No current facility-administered medications on file prior to visit.    BP 132/60 mmHg  Pulse 93  Temp(Src) 98.6 F (37 C) (Oral)  Resp 20  Ht 5\' 6"  (1.676 m)  Wt 103 lb (46.72 kg)  BMI 16.63 kg/m2  SpO2 90%      Review of Systems  Constitutional: Positive for activity change, appetite change and fatigue.  HENT: Negative for congestion, dental problem, hearing loss, rhinorrhea, sinus pressure, sore throat and tinnitus.   Eyes: Negative for pain, discharge and visual disturbance.  Respiratory: Positive for shortness of breath. Negative for cough.   Cardiovascular: Negative for chest pain, palpitations and leg swelling.  Gastrointestinal: Negative for nausea, vomiting, abdominal pain, diarrhea, constipation, blood in stool and abdominal distention.   Genitourinary: Negative for dysuria, urgency, frequency, hematuria, flank pain, vaginal bleeding, vaginal discharge, difficulty urinating, vaginal pain and pelvic pain.  Musculoskeletal: Negative for joint swelling, arthralgias and gait problem.  Skin: Negative for rash.  Neurological: Positive for weakness. Negative for dizziness, syncope, speech difficulty, numbness and headaches.  Hematological: Negative for adenopathy.  Psychiatric/Behavioral: Negative for behavioral problems, dysphoric mood and agitation. The patient is not nervous/anxious.        Objective:   Physical Exam  Constitutional: She is oriented to person, place, and time. She appears well-developed and well-nourished.  Alert No distress Chronically ill Nasal cannula oxygen in place Blood pressure 130/60  HENT:  Head: Normocephalic.  Right Ear: External ear normal.  Left Ear: External ear normal.  Mouth/Throat: Oropharynx is clear and moist.  No evidence of thrush  Eyes: Conjunctivae and EOM are normal. Pupils are equal, round, and reactive to light.  Neck: Normal range of motion. Neck supple. No thyromegaly present.  Cardiovascular: Normal rate, regular rhythm, normal heart sounds and intact distal pulses.   Pulse rate 90  Pulmonary/Chest: Effort normal. No respiratory distress. She has no wheezes. She has no rales.  Markedly diminished breath sounds but clear  Abdominal: Soft. Bowel sounds are normal. She exhibits no mass. There is no tenderness.  Musculoskeletal: Normal range of motion. She exhibits no edema.  Lymphadenopathy:    She has no cervical adenopathy.  Neurological: She is alert and oriented to person, place, and time.  Skin: Skin is warm and dry. No rash noted.  Psychiatric: She has a normal mood and affect. Her behavior is normal.          Assessment & Plan:   Advanced COPD Status post hospital admit for acute on chronic hypoxic and hypercarbic respiratory failure.  Pulmonary follow-up as  scheduled in 10 days.  Patient will complete prednisone and antibiotic therapy Dyslipidemia.  Continue reduced dose of simvastatin 20 mg daily due to amlodipine use Essential hypertension, stable Malnutrition  Recheck 3 months Pulmonary follow-up

## 2014-09-13 NOTE — Patient Instructions (Signed)
Completed prednisone and Levaquin as prescribed  Pulmonary follow-up as scheduled  Limit your sodium (Salt) intake  Return in 3 months for follow-up

## 2014-09-13 NOTE — Progress Notes (Signed)
Pre visit review using our clinic review tool, if applicable. No additional management support is needed unless otherwise documented below in the visit note. 

## 2014-09-17 DIAGNOSIS — J9601 Acute respiratory failure with hypoxia: Secondary | ICD-10-CM | POA: Diagnosis not present

## 2014-09-17 DIAGNOSIS — I10 Essential (primary) hypertension: Secondary | ICD-10-CM | POA: Diagnosis not present

## 2014-09-17 DIAGNOSIS — J441 Chronic obstructive pulmonary disease with (acute) exacerbation: Secondary | ICD-10-CM | POA: Diagnosis not present

## 2014-09-20 ENCOUNTER — Telehealth: Payer: Self-pay | Admitting: Internal Medicine

## 2014-09-20 DIAGNOSIS — J9601 Acute respiratory failure with hypoxia: Secondary | ICD-10-CM | POA: Diagnosis not present

## 2014-09-20 DIAGNOSIS — J441 Chronic obstructive pulmonary disease with (acute) exacerbation: Secondary | ICD-10-CM | POA: Diagnosis not present

## 2014-09-20 DIAGNOSIS — I10 Essential (primary) hypertension: Secondary | ICD-10-CM | POA: Diagnosis not present

## 2014-09-20 NOTE — Telephone Encounter (Signed)
Please see message and advise 

## 2014-09-20 NOTE — Telephone Encounter (Signed)
Daughter states when pt was in the hospital, they took her off of her lasix. Now pt's feet are swelling and they want to know if perhaps she should go back on the lasix. Pt seen last Friday for hosp fup.  Pt has quite a few left furosemide (LASIX) 20 MG tablet  [facility]

## 2014-09-20 NOTE — Telephone Encounter (Signed)
Spoke to pt's daughter [name], told her okay to resume Lasix 20 mg daily but needs to make follow up appt with Dr.K in 7-10 days per him. [name] verbalized understanding and will call on Monday to schedule appt.

## 2014-09-20 NOTE — Telephone Encounter (Signed)
Okay to resume Lasix but will need office follow-up in 7-10 days

## 2014-09-24 ENCOUNTER — Encounter: Payer: Self-pay | Admitting: Adult Health

## 2014-09-24 ENCOUNTER — Ambulatory Visit (INDEPENDENT_AMBULATORY_CARE_PROVIDER_SITE_OTHER): Payer: Medicare Other | Admitting: Adult Health

## 2014-09-24 VITALS — BP 136/68 | HR 103 | Temp 99.3°F | Ht 65.0 in | Wt 104.0 lb

## 2014-09-24 DIAGNOSIS — J9612 Chronic respiratory failure with hypercapnia: Secondary | ICD-10-CM

## 2014-09-24 DIAGNOSIS — J439 Emphysema, unspecified: Secondary | ICD-10-CM

## 2014-09-24 DIAGNOSIS — J9611 Chronic respiratory failure with hypoxia: Secondary | ICD-10-CM | POA: Diagnosis not present

## 2014-09-24 NOTE — Progress Notes (Signed)
   Subjective:    Patient ID: Cathy Hensley, female    DOB: 12-Mar-1940, 74 y.o.   MRN: [id number]  HPI 74 year old  Former smoker female seen during hospitalization for pulmonary consult 09/06/14 for COPD exacerbation and acute on chronic hypercarbic and hypoxic respiratory failure.   09/24/2014 Post Hospital follow up  Patient returns for post hospital follow up for hypercarbic respiratory failure .She was seen as a consult by Dr. Molli Knock on 09/06/14. She was treated with IV antibiotics and steroids .  Initially required BiPap and was later  transitioned to her baseline chronic O2, requiring 3L O2 at rest. She did need increased O2 at 5 L with sleep.  Pt states breathing has improved since hopsitalization. Her DOE and cough are improved. Has some lingering congestion with yellow mucus . Has congestion and sinus drainage which is her baseline . Her energy level has also improved. She Denies any wheezing,  or tightness. She states that she is improving , but not totally at baseline. Paperwork for handicapped placard completed.   Review of Systems Constitutional:   No  weight loss, night sweats,  Fevers, chills, fatigue, or  lassitude.  HEENT:   No headaches,  Difficulty swallowing,  Tooth/dental problems, or  Sore throat,                No sneezing, itching, ear ache, nasal congestion, post nasal drip, + sinus drainage  CV:  No chest pain,  Orthopnea, PND, swelling in lower extremities, anasarca, dizziness, palpitations, syncope.   GI  No heartburn, indigestion, abdominal pain, nausea, vomiting, diarrhea, change in bowel habits, loss of appetite, bloody stools.   Resp:  shortness of breath with exertion is improving   Mucus production and cough returning to baseline,  No coughing up of blood.   Color of mucus is yellow which is baseline.  No wheezing.  No chest wall deformity  Skin: no rash or lesions.  GU: no dysuria, change in color of urine, no urgency or frequency.  No flank pain, no  hematuria   MS:  No joint pain or swelling.  No decreased range of motion.  No back pain.  Psych:  No change in mood or affect. No depression or anxiety.  No memory loss.         Objective:   Physical Exam GEN: A/Ox3; pleasant , NAD, frail /eldelry and appeared deconditioned on 3 liters of O2 pulsed.   HEENT:  [place]/AT,  EACs-clear, TMs-wnl, NOSE-clear, THROAT-clear, no lesions, no postnasal drip or exudate noted.   NECK:  Supple w/ fair ROM; no JVD; normal carotid impulses w/o bruits; no thyromegaly or nodules palpated; no lymphadenopathy.  RESP  diminished BS in bases  W/ no wheezes/ rales/ or rhonchi.no accessory muscle use, no dullness to percussion, coarse throughout  CARD:  RRR, no m/r/g  , no peripheral edema, pulses intact, no cyanosis or clubbing.  GI:   Soft & nt; nml bowel sounds; no organomegaly or masses detected.  Musco: Warm bil, no deformities or joint swelling noted.   Neuro: alert, no focal deficits noted.    Skin: Warm, no lesions or rashes         Assessment & Plan:

## 2014-09-24 NOTE — Assessment & Plan Note (Signed)
Recent COPD exacerbation requiring hospitalization. Improved  with steroids and antibiotics, nearing baseline. Pt. Appears to have advanced COPD, requiring oxygen. Will need PFT's on return.  Plan:  Continue on Pulmicort Neb Twice daily   Continue on Spiriva daily  Continue on Serevent Inhaler Twice daily   Use Xopenex Neb every 4hrs as needed only for wheezing/shortness of breath.  Rinse after inhaler and nebs Follow up with PCP next week  As planned.  Continue Oxygen  Follow up Dr Jamison Neighbor in 6 weeks with PFT and As needed   Please contact office for sooner follow up if symptoms do not improve or worsen or seek emergency care

## 2014-09-24 NOTE — Patient Instructions (Addendum)
Continue on Pulmicort Neb Twice daily   Continue on Spiriva daily  Continue on Serevent Inhaler Twice daily   Use Xopenex Neb every 4hrs as needed only for wheezing/shortness of breath.  Rinse after inhaler and nebs Follow up with PCP next week  As planned.  Continue Oxygen  Follow up Dr Jamison Neighbor in 6 weeks with PFT and As needed   Please contact office for sooner follow up if symptoms do not improve or worsen or seek emergency care

## 2014-09-24 NOTE — Assessment & Plan Note (Signed)
Compensated at O2 at 3L at rest and 5 L at bedtime.  Consider on return wean O2 at bedtime  Plan  Continue O2

## 2014-09-26 DIAGNOSIS — J449 Chronic obstructive pulmonary disease, unspecified: Secondary | ICD-10-CM | POA: Diagnosis not present

## 2014-10-01 ENCOUNTER — Ambulatory Visit (INDEPENDENT_AMBULATORY_CARE_PROVIDER_SITE_OTHER): Payer: Medicare Other | Admitting: Internal Medicine

## 2014-10-01 ENCOUNTER — Other Ambulatory Visit: Payer: Self-pay | Admitting: *Deleted

## 2014-10-01 ENCOUNTER — Telehealth: Payer: Self-pay | Admitting: *Deleted

## 2014-10-01 ENCOUNTER — Encounter: Payer: Self-pay | Admitting: Internal Medicine

## 2014-10-01 VITALS — BP 120/60 | HR 103 | Temp 99.3°F | Resp 16 | Ht 65.0 in | Wt 107.0 lb

## 2014-10-01 DIAGNOSIS — Z23 Encounter for immunization: Secondary | ICD-10-CM | POA: Diagnosis not present

## 2014-10-01 DIAGNOSIS — I10 Essential (primary) hypertension: Secondary | ICD-10-CM

## 2014-10-01 DIAGNOSIS — J9610 Chronic respiratory failure, unspecified whether with hypoxia or hypercapnia: Secondary | ICD-10-CM | POA: Diagnosis not present

## 2014-10-01 DIAGNOSIS — E785 Hyperlipidemia, unspecified: Secondary | ICD-10-CM | POA: Diagnosis not present

## 2014-10-01 DIAGNOSIS — J432 Centrilobular emphysema: Secondary | ICD-10-CM

## 2014-10-01 MED ORDER — MONTELUKAST SODIUM 10 MG PO TABS: 10.0000 mg | ORAL_TABLET | Freq: Every day | ORAL | Status: DC

## 2014-10-01 MED ORDER — SALMETEROL XINAFOATE 50 MCG/DOSE IN AEPB: 1.0000 | INHALATION_SPRAY | Freq: Two times a day (BID) | RESPIRATORY_TRACT | Status: DC

## 2014-10-01 MED ORDER — AMLODIPINE BESYLATE 5 MG PO TABS: 5.0000 mg | ORAL_TABLET | Freq: Every day | ORAL | Status: DC

## 2014-10-01 MED ORDER — FUROSEMIDE 20 MG PO TABS: ORAL_TABLET | ORAL | Status: DC

## 2014-10-01 NOTE — Progress Notes (Signed)
Subjective:    Patient ID: Cathy Hensley, female    DOB: [birth year], 74 y.o.   MRN: [id number]  HPI  Wt Readings from Last 3 Encounters:  10/01/14 107 lb (48.535 kg)  09/24/14 104 lb (47.174 kg)  09/13/14 103 lb (46.72 kg)   BP Readings from Last 3 Encounters:  10/01/14 120/60  09/24/14 136/68  09/13/14 21/58   74 year old patient who has advanced oxygen-dependent COPD.  She has been hospitalized recently and has had worsening pedal edema.  She has resumed furosemide 20 mg daily.  Her pulmonary status has been stable.  She has hypertension and has been on amlodipine 10 mg daily.  Blood pressure readings are trending down and now are low normal.  Past Medical History  Diagnosis Date  . COPD 07/13/2006  . HYPERTENSION 07/13/2006  . MENOPAUSAL SYNDROME 07/13/2006  . Other and unspecified hyperlipidemia 07/13/2006    Social History   Social History  . Marital Status: Married    Spouse Name: N/A  . Number of Children: N/A  . Years of Education: N/A   Occupational History  . Not on file.   Social History Main Topics  . Smoking status: Former Smoker -- 0.50 packs/day for 40 years    Types: Cigarettes    Quit date: 09/23/2000  . Smokeless tobacco: Former Neurosurgeon    Quit date: 01/05/2004  . Alcohol Use: 0.6 oz/week    1 Standard drinks or equivalent per week     Comment: 1 glass of wine weekly.  . Drug Use: No  . Sexual Activity: Not on file   Other Topics Concern  . Not on file   Social History Narrative   Lives at home with her husband. Retired.    Past Surgical History  Procedure Laterality Date  . Tonsillectomy      Family History  Problem Relation Age of Onset  . AAA (abdominal aortic aneurysm) Mother   . AAA (abdominal aortic aneurysm) Sister   . Aneurysm Sister     No Known Allergies  Current Outpatient Prescriptions on File Prior to Visit  Medication Sig Dispense Refill  . acetaminophen (TYLENOL) 500 MG tablet Take 500 mg by mouth every 6 (six) hours as  needed.    Marland Kitchen amLODipine (NORVASC) 10 MG tablet Take 1 tablet (10 mg total) by mouth daily. 30 tablet 0  . aspirin 81 MG chewable tablet Chew 1 tablet (81 mg total) by mouth daily.    . budesonide (PULMICORT) 0.5 MG/2ML nebulizer solution Take 2 mLs (0.5 mg total) by nebulization 2 (two) times daily. 60 mL 12  . furosemide (LASIX) 20 MG tablet Take 20 mg by mouth daily.  0  . levalbuterol (XOPENEX) 0.63 MG/3ML nebulizer solution Take 3 mLs (0.63 mg total) by nebulization every 3 (three) hours as needed for wheezing. (Patient taking differently: Take 0.63 mg by nebulization as needed for wheezing. ) 3 mL 12  . montelukast (SINGULAIR) 10 MG tablet Take 1 tablet (10 mg total) by mouth at bedtime. 30 tablet 0  . salmeterol (SEREVENT DISKUS) 50 MCG/DOSE diskus inhaler Inhale 1 puff into the lungs 2 (two) times daily. 1 Inhaler 0  . simvastatin (ZOCOR) 20 MG tablet Take 1 tablet (20 mg total) by mouth at bedtime. 90 tablet 3  . tiotropium (SPIRIVA) 18 MCG inhalation capsule Place 1 capsule (18 mcg total) into inhaler and inhale daily. 30 capsule 12   No current facility-administered medications on file prior to visit.    BP 120/60  mmHg  Pulse 103  Temp(Src) 99.3 F (37.4 C) (Oral)  Resp 16  Ht 5\' 5"  (1.651 m)  Wt 107 lb (48.535 kg)  BMI 17.81 kg/m2  SpO2 93%       Review of Systems  Constitutional: Negative.   HENT: Negative for congestion, dental problem, hearing loss, rhinorrhea, sinus pressure, sore throat and tinnitus.   Eyes: Negative for pain, discharge and visual disturbance.  Respiratory: Positive for shortness of breath. Negative for cough.   Cardiovascular: Positive for leg swelling. Negative for chest pain and palpitations.  Gastrointestinal: Negative for nausea, vomiting, abdominal pain, diarrhea, constipation, blood in stool and abdominal distention.  Genitourinary: Negative for dysuria, urgency, frequency, hematuria, flank pain, vaginal bleeding, vaginal discharge,  difficulty urinating, vaginal pain and pelvic pain.  Musculoskeletal: Negative for joint swelling, arthralgias and gait problem.  Skin: Negative for rash.  Neurological: Negative for dizziness, syncope, speech difficulty, weakness, numbness and headaches.  Hematological: Negative for adenopathy.  Psychiatric/Behavioral: Negative for behavioral problems, dysphoric mood and agitation. The patient is not nervous/anxious.        Objective:   Physical Exam  Constitutional: She is oriented to person, place, and time. She appears well-developed and well-nourished.  Blood pressure 110/60  HENT:  Head: Normocephalic.  Right Ear: External ear normal.  Left Ear: External ear normal.  Mouth/Throat: Oropharynx is clear and moist.  Eyes: Conjunctivae and EOM are normal. Pupils are equal, round, and reactive to light.  Neck: Normal range of motion. Neck supple. No thyromegaly present.  Cardiovascular: Normal rate, regular rhythm, normal heart sounds and intact distal pulses.   Pulmonary/Chest: Effort normal.  Markedly diminished breath sounds but clear  Abdominal: Soft. Bowel sounds are normal. She exhibits no mass. There is no tenderness.  Musculoskeletal: Normal range of motion. She exhibits edema.  Plus 1 pedal edema  Lymphadenopathy:    She has no cervical adenopathy.  Neurological: She is alert and oriented to person, place, and time.  Skin: Skin is warm and dry. No rash noted.  Psychiatric: She has a normal mood and affect. Her behavior is normal.          Assessment & Plan:  Mild pedal edema.  Will continue salt restricted diet.  Will decrease amlodipine from 10-5 mg daily, which hopefully will be helpful.  We'll decrease furosemide to when necessary only COPD stable Hypertension.  Blood pressure low normal.  Decrease amlodipine to 5 mg daily.  Continue home blood pressure monitoring

## 2014-10-01 NOTE — Patient Instructions (Signed)
Limit your sodium (Salt) intake  Please check your blood pressure on a regular basis.  If it is consistently greater than 150/90, please make an office appointment.  Decrease furosemide to 1 tablet daily only if needed for fluid control  Decrease amlodipine to 5 mg daily

## 2014-10-01 NOTE — Progress Notes (Signed)
Pre visit review using our clinic review tool, if applicable. No additional management support is needed unless otherwise documented below in the visit note. 

## 2014-10-01 NOTE — Telephone Encounter (Signed)
[facility] and cancelled Rx for Ipratropium 0.5 mg/ Albuterol 2.5 mg nebulizer solution it was discontinued.

## 2014-10-03 ENCOUNTER — Other Ambulatory Visit: Payer: Self-pay | Admitting: Internal Medicine

## 2014-10-04 ENCOUNTER — Telehealth: Payer: Self-pay | Admitting: Adult Health

## 2014-10-04 NOTE — Telephone Encounter (Signed)
Spoke with the pt  She states that she uses continuous o2 in the home, and pulsed on her POC  She states that she is unsure if she is breathing deep enough through her nose while using pulsed o2  She is asking if she should use her neb before walking outside to help with this  I advised she should only use neb if needed, if gets SOB try to rest and then use neb if she has to  She verbalized understanding  I asked if she has ever thought about rehab, this may benefit her and teach her about different breathing exercises  She seems interested TP, do you want to order this for her or wait until ov with Dr. Jamison Neighbor  Please advise, thanks!

## 2014-10-06 NOTE — Telephone Encounter (Signed)
Will have to wait until return as she has to have PFT done as she has never had them in past

## 2014-10-07 NOTE — Telephone Encounter (Signed)
Pt is aware that she will have to wait until she sees JN and has her PFT before pulmonary rehab can be ordered. Nothing further was needed at this time.

## 2014-10-10 DIAGNOSIS — J449 Chronic obstructive pulmonary disease, unspecified: Secondary | ICD-10-CM | POA: Diagnosis not present

## 2014-10-25 DIAGNOSIS — J449 Chronic obstructive pulmonary disease, unspecified: Secondary | ICD-10-CM | POA: Diagnosis not present

## 2014-11-05 ENCOUNTER — Ambulatory Visit (INDEPENDENT_AMBULATORY_CARE_PROVIDER_SITE_OTHER): Payer: Medicare Other | Admitting: Pulmonary Disease

## 2014-11-05 DIAGNOSIS — J9611 Chronic respiratory failure with hypoxia: Secondary | ICD-10-CM

## 2014-11-05 LAB — PULMONARY FUNCTION TEST
DL/VA % pred: 42 %
DL/VA: 2.08 ml/min/mmHg/L
DLCO UNC % PRED: 27 %
DLCO unc: 6.85 ml/min/mmHg
FEF 25-75 PRE: 0.2 L/s
FEF 25-75 Post: 0.26 L/sec
FEF2575-%Change-Post: 27 %
FEF2575-%PRED-POST: 14 %
FEF2575-%PRED-PRE: 11 %
FEV1-%Change-Post: 13 %
FEV1-%Pred-Post: 25 %
FEV1-%Pred-Pre: 22 %
FEV1-POST: 0.56 L
FEV1-Pre: 0.49 L
FEV1FVC-%Change-Post: 8 %
FEV1FVC-%Pred-Pre: 43 %
FEV6-%CHANGE-POST: 10 %
FEV6-%PRED-POST: 53 %
FEV6-%Pred-Pre: 47 %
FEV6-PRE: 1.35 L
FEV6-Post: 1.5 L
FEV6FVC-%CHANGE-POST: 5 %
FEV6FVC-%PRED-PRE: 93 %
FEV6FVC-%Pred-Post: 98 %
FVC-%CHANGE-POST: 5 %
FVC-%PRED-POST: 54 %
FVC-%Pred-Pre: 51 %
FVC-Post: 1.6 L
FVC-Pre: 1.52 L
POST FEV1/FVC RATIO: 35 %
POST FEV6/FVC RATIO: 93 %
PRE FEV1/FVC RATIO: 32 %
Pre FEV6/FVC Ratio: 89 %

## 2014-11-05 NOTE — Progress Notes (Signed)
PFT done today. 

## 2014-11-06 ENCOUNTER — Encounter: Payer: Self-pay | Admitting: Pulmonary Disease

## 2014-11-06 ENCOUNTER — Ambulatory Visit (INDEPENDENT_AMBULATORY_CARE_PROVIDER_SITE_OTHER): Payer: Medicare Other | Admitting: Pulmonary Disease

## 2014-11-06 ENCOUNTER — Telehealth: Payer: Self-pay | Admitting: Pulmonary Disease

## 2014-11-06 ENCOUNTER — Other Ambulatory Visit: Payer: Medicare Other

## 2014-11-06 VITALS — BP 134/66 | HR 91 | Ht 64.75 in | Wt 111.4 lb

## 2014-11-06 DIAGNOSIS — J449 Chronic obstructive pulmonary disease, unspecified: Secondary | ICD-10-CM | POA: Diagnosis not present

## 2014-11-06 DIAGNOSIS — J9612 Chronic respiratory failure with hypercapnia: Secondary | ICD-10-CM | POA: Diagnosis not present

## 2014-11-06 DIAGNOSIS — J9611 Chronic respiratory failure with hypoxia: Secondary | ICD-10-CM | POA: Diagnosis not present

## 2014-11-06 MED ORDER — ARFORMOTEROL TARTRATE 15 MCG/2ML IN NEBU: 15.0000 ug | INHALATION_SOLUTION | Freq: Two times a day (BID) | RESPIRATORY_TRACT | Status: DC

## 2014-11-06 MED ORDER — LEVALBUTEROL TARTRATE 45 MCG/ACT IN AERO: 2.0000 | INHALATION_SPRAY | RESPIRATORY_TRACT | Status: DC | PRN

## 2014-11-06 NOTE — Progress Notes (Signed)
Subjective:    Patient ID: Cathy Hensley, female    DOB: [birth year], 74 y.o.   MRN: [id number]  C.C.:  Follow-up for Very Severe COPD & Chronic Hypoxic Respiratory Failure.  HPI Very Severe COPD:  Currently she is using Serevent, Budesonide 0.25mg  bid, & Spiriva Handi-Haler. She was recently started on Singulair during her recent hospitalization. She has no rescue inhaler. She hasn't felt the need to use her xopenex rescue. She reports her dyspnea is improving but she doesn't feel she is back to her baseline. Denies any coughing and only minimal wheezing. No exacerbations since her last appointment. She reports previously she did have bronchitis once a year at most. She has had more problems with her breathing since she moved to [facility], especially during times of high pollen. During her hospitalization she was on Brovana but reportedly the cost was too extreme to afford. This was not prescribed through her oxygen supply company. Patient previously was on Duonebs.  Chronic Hypoxic Respiratory Failure: Patient currently using 4 L/m on pulse with a portable tank for exertion. Prescribed oxygen at 3 L/m during the day and 3 L/m at night while sleeping.  Review of Systems Denies any chest pain or pressure. No fever, chills, or sweats.   No Known Allergies  Current Outpatient Prescriptions on File Prior to Visit  Medication Sig Dispense Refill  . acetaminophen (TYLENOL) 500 MG tablet Take 500 mg by mouth every 6 (six) hours as needed.    Marland Kitchen amLODipine (NORVASC) 5 MG tablet Take 1 tablet (5 mg total) by mouth daily. 90 tablet 3  . aspirin 81 MG chewable tablet Chew 1 tablet (81 mg total) by mouth daily.    . budesonide (PULMICORT) 0.5 MG/2ML nebulizer solution Take 2 mLs (0.5 mg total) by nebulization 2 (two) times daily. 60 mL 12  . furosemide (LASIX) 20 MG tablet 1 tablet daily as needed for fluid retention 30 tablet 3  . levalbuterol (XOPENEX) 0.63 MG/3ML nebulizer solution Take 3 mLs (0.63 mg  total) by nebulization every 3 (three) hours as needed for wheezing. (Patient taking differently: Take 0.63 mg by nebulization as needed for wheezing. ) 3 mL 12  . montelukast (SINGULAIR) 10 MG tablet TAKE 1 TABLET BY MOUTH AT BEDTIME 30 tablet 5  . salmeterol (SEREVENT DISKUS) 50 MCG/DOSE diskus inhaler Inhale 1 puff into the lungs 2 (two) times daily. 1 Inhaler 2  . simvastatin (ZOCOR) 20 MG tablet Take 1 tablet (20 mg total) by mouth at bedtime. 90 tablet 3  . tiotropium (SPIRIVA) 18 MCG inhalation capsule Place 1 capsule (18 mcg total) into inhaler and inhale daily. 30 capsule 12   No current facility-administered medications on file prior to visit.    Past Medical History  Diagnosis Date  . COPD 07/13/2006  . HYPERTENSION 07/13/2006  . MENOPAUSAL SYNDROME 07/13/2006  . Other and unspecified hyperlipidemia 07/13/2006  . Respiratory failure (HCC)     Chronic hypoxic    Past Surgical History  Procedure Laterality Date  . Tonsillectomy      Family History  Problem Relation Age of Onset  . AAA (abdominal aortic aneurysm) Mother   . Emphysema Mother   . AAA (abdominal aortic aneurysm) Sister   . Aneurysm Sister   . Emphysema Father     Social History   Social History  . Marital Status: Married    Spouse Name: N/A  . Number of Children: N/A  . Years of Education: N/A   Social History  Main Topics  . Smoking status: Former Smoker -- 0.50 packs/day for 42 years    Types: Cigarettes    Quit date: 09/23/2000  . Smokeless tobacco: Former Neurosurgeon    Quit date: 01/05/2004  . Alcohol Use: 0.6 oz/week    1 Standard drinks or equivalent per week     Comment: 1 glass of wine nearly daily.  . Drug Use: No  . Sexual Activity: Not Asked   Other Topics Concern  . None   Social History Narrative   Lives at home with her husband. Retired.      [facility]:   She is originally from [place]. She has 2 daughters, one lives in [place] & the other in [place]. Previously worked Clinical cytogeneticist a  Musician, ran a Lawyer, clerical work, Lawyer. She has no pets currently. No previous bird, mold, or hot tub exposure.       Objective:   Physical Exam Blood pressure 134/66, pulse 91, height 5' 4.75" (1.645 m), weight 111 lb 6.4 oz (50.531 kg), SpO2 92 %. General:  Awake. Alert. No acute distress. Accompanied by daughter and husband today.  Integument:  Warm & dry. No rash on exposed skin. No bruising. HEENT:  Moist mucus membranes. No oral ulcers. No scleral injection or icterus. No nasal turbinate swelling Cardiovascular:  Regular rate. No edema. No appreciable JVD.  Pulmonary:  Symmetrically decreased breath sounds. Symmetric chest wall expansion. No accessory muscle use on oxygen. Abdomen: Soft. Normal bowel sounds. Nondistended. Grossly nontender. Musculoskeletal:  Normal bulk and tone. Hand grip strength 5/5 bilaterally. No joint deformity or effusion appreciated.  PFT 11/05/14: FVC 1.52 L (51%) FEV1 0.49 L (22%) FEV1/FVC 0.32 FEF 25-75 0.20 L (11%) no bronchodilator response ERV 87% DLCO uncorrected 27%  IMAGING Portable CXR 09/03/14 (personally reviewed by me): Hyperinflation with flattening of the diaphragms. No focal opacity or mass appreciated. No pleural effusion or thickening appreciated. Heart normal in size. Mediastinum normal in contour.  CARDIAC TTE (09/04/14): LV normal in size. Moderate LVH. EF 60-65%. No regional wall motion abnormalities. LA & RA normal in size. RV normal in size and function. Pulmonary artery systolic pressure 30 mmHg. No aortic stenosis or regurgitation. Normal aortic root. Mild mitral regurgitation. No pulmonic regurgitation. Trivial tricuspid regurgitation. Trivial pericardial effusion.  MICROBIOLOGY Sputum (09/06/14): Oral flora Urine Strep Ag (09/06/14): Negative Urine Legionella Ag (09/06/14): Negative Influenza A/B PCR (09/06/14): Negative   LABS 09/06/14 ABG on 3 L/m: 7.40 / 65 / 63 / 91% CBC:  11.5/14.2/46.3/240 BMP: 140/4.4/99/41/19/0.38/118/9.2 LFT: 3.7/6.3/0.5/61/20/21    Assessment & Plan:  74 year old female with hospitalization recently for a COPD exacerbation with evidence of chronic hypercarbic and hypoxic respiratory failure on her ABG. She has very severe COPD based on spirometry performed yesterday. I do question drug delivery on her Serevent discus and the patient was previously on Brovana while hospitalized which I feel would be more effective in treating her underlying COPD. Patient very remotely was in pulmonary rehabilitation and I feel would benefit from a repeat course of therapy given her recent hospitalization. I spent a significant amount of time educating the patient and her family members regarding her underlying disease process and discussing the need for consistent use of medications and contacting my office for any new breathing problems she should encounter to prevent rehospitalization.  1. Very severe COPD: Screening for alpha-1 antitrypsin deficiency. Continuing patient on budesonide & Spiriva. Prescribing the patient Xopenex inhaler to use along with her nebulizer as needed. Starting  Brovana nebulized twice daily. Plan to repeat spirometry with bronchodilator challenge & DLCO next appointment. 2. Chronic hypoxic respiratory failure: Continuing patient on oxygen as previously prescribed. Checking 6 minute walk test with oxygen titration at next appointment. 3. Chronic hypercapnic respiratory failure: Likely secondary to underlying COPD. Titrate oxygen to avoid V/Q mismatching with 6 minute walk test at next appointment. 4. Health maintenance: Patient reports she has received influenza, Prevnar, and Pneumovax vaccines. Patient counseled to avoid anyone with any GI or respiratory illnesses to prevent COPD exacerbations. 5. Follow-up: Patient to return to clinic in 2-3 months or sooner if needed.

## 2014-11-06 NOTE — Telephone Encounter (Signed)
PFT 11/05/14:  FVC 1.52 L (51%) FEV1 0.49 L (22%) FEV1/FVC 0.32 FEF 25-75 0.20 L (11%) no bronchodilator response ERV 87% DLCO uncorrected 27%  IMAGING Portable CXR 09/03/14 (personally reviewed by me): Hyperinflation with flattening of the diaphragms. No focal opacity or mass appreciated. No pleural effusion or thickening appreciated. Heart normal in size. Mediastinum normal in contour.  CARDIAC TTE (09/04/14): LV normal in size. Moderate LVH. EF 60-65%. No regional wall motion abnormalities. LA & RA normal in size. RV normal in size and function. Pulmonary artery systolic pressure 30 mmHg. No aortic stenosis or regurgitation. Normal aortic root. Mild mitral regurgitation. No pulmonic regurgitation. Trivial tricuspid regurgitation. Trivial pericardial effusion.  MICROBIOLOGY Sputum (09/06/14):  Oral flora Urine Strep Ag (09/06/14):  Negative Urine Legionella Ag (09/06/14):  Negative Influenza A/B PCR (09/06/14):  Negative   LABS 09/06/14 ABG on 3 L/m:  7.40 / 65 / 63 / 91% CBC: 11.5/14.2/46.3/240 BMP: 140/4.4/99/41/19/0.38/118/9.2 LFT: 3.7/6.3/0.5/61/20/21

## 2014-11-06 NOTE — Patient Instructions (Signed)
1. You will continue to take Spiriva, budesonide (Pulmicort), & Xopenex as prescribed. 2. I am prescribing you Brovana to nebulize in place of your Serevent. Please call [employer] tomorrow to make sure your co-pay is not outrageous as [employer] does have a drug assistance program I believe. 3. Do not mix your Brovana and budesonide. 4. I am prescribing you a Xopenex inhaler to use as needed. If you experience any shortness of breath, coughing or wheezing please use the inhaler. 5. I'm referring you to pulmonary rehabilitation at [facility]. 6. We will be repeating breathing tests and a walking test at your next appointment with me. 7. Please contact my office if you have any new breathing problems before your next appointment. 8. I will see you back in 2-3 months or sooner if needed.

## 2014-11-07 DIAGNOSIS — J449 Chronic obstructive pulmonary disease, unspecified: Secondary | ICD-10-CM | POA: Diagnosis not present

## 2014-11-10 DIAGNOSIS — J449 Chronic obstructive pulmonary disease, unspecified: Secondary | ICD-10-CM | POA: Diagnosis not present

## 2014-11-13 LAB — ALPHA-1 ANTITRYPSIN PHENOTYPE: A1 ANTITRYPSIN: 88 mg/dL (ref 83–199)

## 2014-11-15 ENCOUNTER — Telehealth (HOSPITAL_COMMUNITY): Payer: Self-pay

## 2014-11-15 NOTE — Telephone Encounter (Signed)
Called patient regarding entrance to Pulmonary Rehab.  Patient states that they are interested in attending the program.  Angelise is going to verify insurance coverage and follow up.

## 2014-11-25 ENCOUNTER — Telehealth (HOSPITAL_COMMUNITY): Payer: Self-pay

## 2014-11-25 NOTE — Telephone Encounter (Signed)
Spoke with Corrie Dandy about attending pulmonary rehab and insurance coverage. She has a 50.00 copay/session and she can't afford 100.00/week. She declined the program as it would be a financial burden. Encouraged patient to call back if her financial situation changed.

## 2014-11-25 NOTE — Telephone Encounter (Signed)
Patient called to state she has reconsidered attending pulmonary rehab. She checked with her insurance and her copay will decrease to 20.00/visit in January that she can afford 40.00/week. Will call patient back middle of December to schedule orientation appointment for January.

## 2014-12-03 DIAGNOSIS — J449 Chronic obstructive pulmonary disease, unspecified: Secondary | ICD-10-CM | POA: Diagnosis not present

## 2014-12-10 DIAGNOSIS — J449 Chronic obstructive pulmonary disease, unspecified: Secondary | ICD-10-CM | POA: Diagnosis not present

## 2014-12-16 ENCOUNTER — Encounter: Payer: Self-pay | Admitting: Internal Medicine

## 2014-12-16 ENCOUNTER — Ambulatory Visit (INDEPENDENT_AMBULATORY_CARE_PROVIDER_SITE_OTHER): Payer: Medicare Other | Admitting: Internal Medicine

## 2014-12-16 VITALS — BP 130/70 | HR 79 | Temp 98.5°F | Ht 64.75 in | Wt 115.0 lb

## 2014-12-16 DIAGNOSIS — J449 Chronic obstructive pulmonary disease, unspecified: Secondary | ICD-10-CM

## 2014-12-16 DIAGNOSIS — I10 Essential (primary) hypertension: Secondary | ICD-10-CM

## 2014-12-16 DIAGNOSIS — J9610 Chronic respiratory failure, unspecified whether with hypoxia or hypercapnia: Secondary | ICD-10-CM | POA: Diagnosis not present

## 2014-12-16 DIAGNOSIS — E785 Hyperlipidemia, unspecified: Secondary | ICD-10-CM

## 2014-12-16 NOTE — Patient Instructions (Signed)
Limit your sodium (Salt) intake  Please check your blood pressure on a regular basis.  If it is consistently greater than 150/90, please make an office appointment.  Return in 6 months for follow-up   

## 2014-12-16 NOTE — Progress Notes (Signed)
Subjective:    Patient ID: Cathy Hensley, female    DOB: [birth year], 74 y.o.   MRN: [id number]  HPI  Wt Readings from Last 3 Encounters:  12/16/14 115 lb (52.164 kg)  11/06/14 111 lb 6.4 oz (50.531 kg)  10/01/14 107 lb (48.51 kg)   74 year old patient who has a history of advanced COPD, oxygen dependent.  She was seen by pulmonary medicine last month and continues to be stable. She plans on starting pulmonary/rehabilitation after the first of the year. She is quite pleased with her progress and continues to gain weight  Past Medical History  Diagnosis Date  . COPD 07/13/2006  . HYPERTENSION 07/13/2006  . MENOPAUSAL SYNDROME 07/13/2006  . Other and unspecified hyperlipidemia 07/13/2006  . Respiratory failure (HCC)     Chronic hypoxic    Social History   Social History  . Marital Status: Married    Spouse Name: N/A  . Number of Children: N/A  . Years of Education: N/A   Occupational History  . Not on file.   Social History Main Topics  . Smoking status: Former Smoker -- 0.50 packs/day for 42 years    Types: Cigarettes    Quit date: 09/23/2000  . Smokeless tobacco: Former Neurosurgeon    Quit date: 01/05/2004  . Alcohol Use: 0.6 oz/week    1 Standard drinks or equivalent per week     Comment: 1 glass of wine nearly daily.  . Drug Use: No  . Sexual Activity: Not on file   Other Topics Concern  . Not on file   Social History Narrative   Lives at home with her husband. Retired.       Pulmonary:   She is originally from [place]. She has 2 daughters, one lives in [place] & the other in [place]. Previously worked Clinical cytogeneticist a Musician, ran a Lawyer, clerical work, Lawyer. She has no pets currently. No previous bird, mold, or hot tub exposure.     Past Surgical History  Procedure Laterality Date  . Tonsillectomy      Family History  Problem Relation Age of Onset  . AAA (abdominal aortic aneurysm) Mother   . Emphysema Mother   . AAA  (abdominal aortic aneurysm) Sister   . Aneurysm Sister   . Emphysema Father     No Known Allergies  Current Outpatient Prescriptions on File Prior to Visit  Medication Sig Dispense Refill  . acetaminophen (TYLENOL) 500 MG tablet Take 500 mg by mouth every 6 (six) hours as needed.    Marland Kitchen amLODipine (NORVASC) 5 MG tablet Take 1 tablet (5 mg total) by mouth daily. 90 tablet 3  . arformoterol (BROVANA) 15 MCG/2ML NEBU Take 2 mLs (15 mcg total) by nebulization 2 (two) times daily. 120 mL 3  . aspirin 81 MG chewable tablet Chew 1 tablet (81 mg total) by mouth daily.    . budesonide (PULMICORT) 0.5 MG/2ML nebulizer solution Take 2 mLs (0.5 mg total) by nebulization 2 (two) times daily. 60 mL 12  . furosemide (LASIX) 20 MG tablet 1 tablet daily as needed for fluid retention 30 tablet 3  . levalbuterol (XOPENEX HFA) 45 MCG/ACT inhaler Inhale 2 puffs into the lungs every 4 (four) hours as needed for wheezing. 1 Inhaler 3  . levalbuterol (XOPENEX) 0.63 MG/3ML nebulizer solution Take 3 mLs (0.63 mg total) by nebulization every 3 (three) hours as needed for wheezing. (Patient taking differently: Take 0.63 mg by nebulization as needed for wheezing. )  3 mL 12  . montelukast (SINGULAIR) 10 MG tablet TAKE 1 TABLET BY MOUTH AT BEDTIME 30 tablet 5  . salmeterol (SEREVENT DISKUS) 50 MCG/DOSE diskus inhaler Inhale 1 puff into the lungs 2 (two) times daily. 1 Inhaler 2  . simvastatin (ZOCOR) 20 MG tablet Take 1 tablet (20 mg total) by mouth at bedtime. 90 tablet 3  . tiotropium (SPIRIVA) 18 MCG inhalation capsule Place 1 capsule (18 mcg total) into inhaler and inhale daily. 30 capsule 12   No current facility-administered medications on file prior to visit.    BP 130/70 mmHg  Pulse 79  Temp(Src) 98.5 F (36.9 C) (Oral)  Ht 5' 4.75" (1.645 m)  Wt 115 lb (52.164 kg)  BMI 19.28 kg/m2  SpO2 98%      Review of Systems  Constitutional: Negative.   HENT: Negative for congestion, dental problem, hearing  loss, rhinorrhea, sinus pressure, sore throat and tinnitus.   Eyes: Negative for pain, discharge and visual disturbance.  Respiratory: Positive for shortness of breath. Negative for cough.   Cardiovascular: Positive for leg swelling. Negative for chest pain and palpitations.  Gastrointestinal: Negative for nausea, vomiting, abdominal pain, diarrhea, constipation, blood in stool and abdominal distention.  Genitourinary: Negative for dysuria, urgency, frequency, hematuria, flank pain, vaginal bleeding, vaginal discharge, difficulty urinating, vaginal pain and pelvic pain.  Musculoskeletal: Negative for joint swelling, arthralgias and gait problem.  Skin: Negative for rash.  Neurological: Negative for dizziness, syncope, speech difficulty, weakness, numbness and headaches.  Hematological: Negative for adenopathy.  Psychiatric/Behavioral: Negative for behavioral problems, dysphoric mood and agitation. The patient is not nervous/anxious.        Objective:   Physical Exam  Constitutional: She is oriented to person, place, and time. She appears well-developed and well-nourished.  No distress Blood pressure stable Pulse rate 79 O2 saturation on supplemental O2 98%  HENT:  Head: Normocephalic.  Right Ear: External ear normal.  Left Ear: External ear normal.  Mouth/Throat: Oropharynx is clear and moist.  Eyes: Conjunctivae and EOM are normal. Pupils are equal, round, and reactive to light.  Neck: Normal range of motion. Neck supple. No thyromegaly present.  Cardiovascular: Normal rate, regular rhythm, normal heart sounds and intact distal pulses.   Pulmonary/Chest: Effort normal. No respiratory distress. She has no wheezes. She has no rales.  Breath sounds generally diminished No increased work of breathing  Abdominal: Soft. Bowel sounds are normal. She exhibits no mass. There is no tenderness.  Musculoskeletal: Normal range of motion. She exhibits no edema.  Lymphadenopathy:    She has no  cervical adenopathy.  Neurological: She is alert and oriented to person, place, and time.  Skin: Skin is warm and dry. No rash noted.  Psychiatric: She has a normal mood and affect. Her behavior is normal.          Assessment & Plan:   Advanced COPD, oxygen dependent, stable.  Follow-up pulmonary medicine.  Patient to start pulmonary rehabilitation after the first of the year Hypertension, well-controlled Dyslipidemia.  Continue statin therapy  Pulmonary follow-up as scheduled Recheck 6 months or as needed

## 2014-12-16 NOTE — Progress Notes (Signed)
Pre visit review using our clinic review tool, if applicable. No additional management support is needed unless otherwise documented below in the visit note. 

## 2015-01-03 DIAGNOSIS — J449 Chronic obstructive pulmonary disease, unspecified: Secondary | ICD-10-CM | POA: Diagnosis not present

## 2015-01-08 ENCOUNTER — Telehealth: Payer: Self-pay | Admitting: Adult Health

## 2015-01-08 NOTE — Telephone Encounter (Signed)
Pt c/o increased shortness of breath, HA, sinus congestion, PND, prod cough with yellow mucus X2 days.  Denies fever, chest pain/tightness.  Pt uses [facility] on [address].    JN please advise on recs.  Thanks!

## 2015-01-08 NOTE — Telephone Encounter (Signed)
Can we get this patient in to be seen tomorrow?

## 2015-01-08 NOTE — Telephone Encounter (Signed)
Called spoke with pt. appt scheduled for tomorrow at 10 w/ CDY.

## 2015-01-09 ENCOUNTER — Ambulatory Visit (INDEPENDENT_AMBULATORY_CARE_PROVIDER_SITE_OTHER): Payer: Medicare Other | Admitting: Internal Medicine

## 2015-01-09 ENCOUNTER — Encounter: Payer: Self-pay | Admitting: Internal Medicine

## 2015-01-09 VITALS — BP 118/60 | HR 72 | Ht 64.75 in | Wt 117.2 lb

## 2015-01-09 DIAGNOSIS — J9611 Chronic respiratory failure with hypoxia: Secondary | ICD-10-CM | POA: Diagnosis not present

## 2015-01-09 DIAGNOSIS — J449 Chronic obstructive pulmonary disease, unspecified: Secondary | ICD-10-CM

## 2015-01-09 DIAGNOSIS — J0101 Acute recurrent maxillary sinusitis: Secondary | ICD-10-CM | POA: Diagnosis not present

## 2015-01-09 MED ORDER — AMOXICILLIN-POT CLAVULANATE 875-125 MG PO TABS: 1.0000 | ORAL_TABLET | Freq: Two times a day (BID) | ORAL | Status: DC

## 2015-01-09 NOTE — Patient Instructions (Addendum)
Script sent for augmentin antibiotic    Ok to continue your routine meds and keep your appointments as scheduled. Please call if we can help

## 2015-01-09 NOTE — Progress Notes (Signed)
Subjective:    Patient ID: Cathy Hensley, female    DOB: 1940/06/07, 75 y.o.   MRN: [id number]  C.C.:  Follow-up for Very Severe COPD & Chronic Hypoxic Respiratory Failure.  HPI Very Severe COPD:  Currently she is using Serevent, Budesonide 0.25mg  bid, & Spiriva Handi-Haler. She was recently started on Singulair during her recent hospitalization. She has no rescue inhaler. She hasn't felt the need to use her xopenex rescue. She reports her dyspnea is improving but she doesn't feel she is back to her baseline. Denies any coughing and only minimal wheezing. No exacerbations since her last appointment. She reports previously she did have bronchitis once a year at most. She has had more problems with her breathing since she moved to [facility], especially during times of high pollen. During her hospitalization she was on Brovana but reportedly the cost was too extreme to afford. This was not prescribed through her oxygen supply company. Patient previously was on Duonebs.  Chronic Hypoxic Respiratory Failure: Patient currently using 4 L/m on pulse with a portable tank for exertion. Prescribed oxygen at 3 L/m during the day and 3 L/m at night while sleeping.       Objective:   Physical Exam Blood pressure 134/66, pulse 91, height 5' 4.75" (1.645 m), weight 111 lb 6.4 oz (50.531 kg), SpO2 92 %. General:  Awake. Alert. No acute distress. Accompanied by daughter and husband today.  Integument:  Warm & dry. No rash on exposed skin. No bruising. HEENT:  Moist mucus membranes. No oral ulcers. No scleral injection or icterus. No nasal turbinate swelling Cardiovascular:  Regular rate. No edema. No appreciable JVD.  Pulmonary:  Symmetrically decreased breath sounds. Symmetric chest wall expansion. No accessory muscle use on oxygen. Abdomen: Soft. Normal bowel sounds. Nondistended. Grossly nontender. Musculoskeletal:  Normal bulk and tone. Hand grip strength 5/5 bilaterally. No joint deformity or effusion  appreciated.  PFT 11/05/14: FVC 1.52 L (51%) FEV1 0.49 L (22%) FEV1/FVC 0.32 FEF 25-75 0.20 L (11%) no bronchodilator response ERV 87% DLCO uncorrected 27%  IMAGING Portable CXR 09/03/14 (personally reviewed by me): Hyperinflation with flattening of the diaphragms. No focal opacity or mass appreciated. No pleural effusion or thickening appreciated. Heart normal in size. Mediastinum normal in contour.  CARDIAC TTE (09/04/14): LV normal in size. Moderate LVH. EF 60-65%. No regional wall motion abnormalities. LA & RA normal in size. RV normal in size and function. Pulmonary artery systolic pressure 30 mmHg. No aortic stenosis or regurgitation. Normal aortic root. Mild mitral regurgitation. No pulmonic regurgitation. Trivial tricuspid regurgitation. Trivial pericardial effusion.  MICROBIOLOGY Sputum (09/06/14): Oral flora Urine Strep Ag (09/06/14): Negative Urine Legionella Ag (09/06/14): Negative Influenza A/B PCR (09/06/14): Negative   LABS 09/06/14 ABG on 3 L/m: 7.40 / 65 / 63 / 91% CBC: 11.5/14.2/46.3/240 BMP: 140/4.4/99/41/19/0.38/118/9.2 LFT: 3.7/6.3/0.5/61/20/21  1. Very severe COPD: Screening for alpha-1 antitrypsin deficiency. Continuing patient on budesonide & Spiriva. Prescribing the patient Xopenex inhaler to use along with her nebulizer as needed. Starting Brovana nebulized twice daily. Plan to repeat spirometry with bronchodilator challenge & DLCO next appointment. 2. Chronic hypoxic respiratory failure: Continuing patient on oxygen as previously prescribed. Checking 6 minute walk test with oxygen titration at next appointment. 3. Chronic hypercapnic respiratory failure: Likely secondary to underlying COPD. Titrate oxygen to avoid V/Q mismatching with 6 minute walk test at next appointment. 4. Health maintenance: Patient reports she has received influenza, Prevnar, and Pneumovax vaccines. Patient counseled to avoid anyone with any GI or  respiratory illnesses to prevent COPD  exacerbations. 5. Follow-up: Patient to return to clinic in 2-3 months or sooner if needed.   01/09/15- 74 yoF followed for severe COPD, chronic respiratory failure with hypoxia O2 3L/ [facility] ACUTE VISIT: JN patient-Increased SOB, ? sinus infection that could have started this.  4 days of bilateral maxillary pressure pain, worse leaning over. Frequent nose blowing still mostly clear mucus. Recognizes early sinus infection pattern for her. Not yet into chest  ROS-see HPI   Negative unless "+" Constitutional:    weight loss, night sweats, fevers, chills, fatigue, lassitude. HEENT:    +headaches, difficulty swallowing, tooth/dental problems, sore throat,       sneezing, itching, ear ache, +nasal congestion, +post nasal drip, snoring CV:    chest pain, orthopnea, PND, swelling in lower extremities, anasarca,                                                   dizziness, palpitations Resp:   +shortness of breath with exertion or at rest.                productive cough,   non-productive cough, coughing up of blood.              change in color of mucus.  wheezing.   Skin:    rash or lesions. GI:  No-   heartburn, indigestion, abdominal pain, nausea, vomiting, diarrhea,                 change in bowel habits, loss of appetite GU:  MS:   joint pain, stiffness, decreased range of motion, back pain. Neuro-     nothing unusual Psych:  change in mood or affect.  depression or anxiety.   memory loss.   OBJ- Physical Exam General- Alert, Oriented, Affect-appropriate, Distress- none acute, does not appear acutely ill Skin- rash-none, lesions- none, excoriation- none Lymphadenopathy- none Head- atraumatic            Eyes- Gross vision intact, PERRLA, conjunctivae and secretions clear            Ears- Hearing, canals-normal            Nose- Clear, no-Septal dev, mucus, polyps, erosion, perforation             Throat- Mallampati II , mucosa clear , drainage- none, tonsils- atrophic Neck- flexible ,  trachea midline, no stridor , thyroid nl, carotid no bruit Chest - symmetrical excursion , unlabored           Heart/CV- RRR , no murmur , no gallop  , no rub, nl s1 s2                           - JVD- none , edema- none, stasis changes- none, varices- none           Lung- +very distant, wheeze- none, cough- none , dullness-none, rub- none, wearing O2           Chest wall-  Abd-  Br/ Gen/ Rectal- Not done, not indicated Extrem- cyanosis- none, clubbing, none, atrophy- none, strength- nl Neuro- grossly intact to observation   Assessment & Plan:

## 2015-01-10 ENCOUNTER — Encounter (HOSPITAL_COMMUNITY): Payer: Self-pay

## 2015-01-10 ENCOUNTER — Encounter (HOSPITAL_COMMUNITY)
Admission: RE | Admit: 2015-01-10 | Discharge: 2015-01-10 | Disposition: A | Discharge status: Home / Self Care | Payer: Medicare Other | Source: Ambulatory Visit | Attending: Pulmonary Disease | Admitting: Pulmonary Disease

## 2015-01-10 VITALS — BP 131/66 | HR 70 | Resp 18 | Ht 64.5 in | Wt 115.5 lb

## 2015-01-10 DIAGNOSIS — J449 Chronic obstructive pulmonary disease, unspecified: Secondary | ICD-10-CM | POA: Diagnosis not present

## 2015-01-10 NOTE — Progress Notes (Signed)
Cathy Hensley 75 y.o. female Pulmonary Rehab Orientation Note Patient arrived today in [facility] Rehab for orientation to Pulmonary Rehab. She was transported from [facility] via wheel chair after she attempted to walk 1/2 way to the department. She does carry portable oxygen. Per pt, she uses oxygen continuously at 3 liters pulse with activity and 3 liters continuous at rest on her home concentrator. Color good, skin warm and dry. Patient is oriented to time and place. Patient's medical history, psychosocial health, and medications reviewed. Psychosocial assessment reveals pt lives with their spouse. Pt is currently retired. Patient's hobbies include reading and watching basketball. She also enjoys traveling to [place] to spend time with her daughter and grandchildren. She also has a daughter locally and twin teenage grandsons she enjoys. Pt reports her stress level is moderate. Areas of stress/anxiety include Health.  Pt does exhibit a few signs of depression. Signs of depression include lack of appetite, a few days of feeling hopeless, a few days of having little energy. She answered the questions honnestly on the PHQ2/9, but is unsure if her answers are related to the physical symptoms of the disease  PHQ2/9 score 2/4. Pt shows good  coping skills with positive outlook. She was offered emotional support and reassurance. Will continue to monitor and evaluate progress toward psychosocial goal(s) of continuing her positive out look on life and continuing to want to better her condition. Physical assessment reveals heart rate is normal, S1S present. Breath sounds diminished throughout. Grip strength equal, strong. Distal pulses palpable. Trace pitting edema noted to both ankles. Patient reports she does take medications as prescribed. Patient states she follows a Regular diet. The patient reports she has purposely gained weight since her hospitalization in late August. She was diagnoses with  malnutrition at that time and per patient only weighed 101 pounds and she is 64.5 inches. Patient's weight will be monitored closely. Demonstration and practice of PLB using pulse oximeter. Patient able to return demonstration satisfactorily. Safety and hand hygiene in the exercise area reviewed with patient. Patient voices understanding of the information reviewed. Department expectations discussed with patient and achievable goals were set. The patient shows enthusiasm about attending the program and we look forward to working with this nice lady. The patient is scheduled for a 6 min walk test on Tuesday 1/10 at 3:15 and to begin exercise on Thursday 1/12 at 10:30.

## 2015-01-13 DIAGNOSIS — J0100 Acute maxillary sinusitis, unspecified: Secondary | ICD-10-CM | POA: Insufficient documentation

## 2015-01-13 NOTE — Assessment & Plan Note (Signed)
Plan-hydration, nasal rinse, Augmentin

## 2015-01-13 NOTE — Assessment & Plan Note (Signed)
At risk for lower respiratory exacerbation if sinusitis is not controlled quickly Plan-continue routine meds and supplemental oxygen

## 2015-01-13 NOTE — Assessment & Plan Note (Signed)
Remains dependent on oxygen 3 L/[facility] without acute change

## 2015-01-14 ENCOUNTER — Encounter (HOSPITAL_COMMUNITY)
Admission: RE | Admit: 2015-01-14 | Discharge: 2015-01-14 | Disposition: A | Discharge status: Home / Self Care | Payer: Medicare Other | Source: Ambulatory Visit | Attending: Pulmonary Disease | Admitting: Pulmonary Disease

## 2015-01-14 NOTE — Progress Notes (Signed)
Cathy Hensley completed a Six-Minute Walk Test on 01/14/15 . Cathy Hensley walked  1506 feet with no rest breaks.  The patient's lowest oxygen saturation was 94 %, highest heart rate was 98 bpm , and highest blood pressure was 204/64. The patient was on 3 liters of oxygen with a nasal cannula. Patient stated that nothing hindered their walk test.   Fabio Pierce, MA, ACSM RCEP

## 2015-01-16 ENCOUNTER — Encounter (HOSPITAL_COMMUNITY)
Admission: RE | Admit: 2015-01-16 | Discharge: 2015-01-16 | Disposition: A | Discharge status: Home / Self Care | Payer: Medicare Other | Source: Ambulatory Visit | Attending: Pulmonary Disease | Admitting: Pulmonary Disease

## 2015-01-16 DIAGNOSIS — J449 Chronic obstructive pulmonary disease, unspecified: Secondary | ICD-10-CM | POA: Diagnosis not present

## 2015-01-16 NOTE — Progress Notes (Signed)
Today, Cathy Hensley exercised at [employer]. Service time was from 1030 to 1215.  The patient exercised by performing aerobic, strengthening, and stretching exercises. Oxygen saturation, heart rate, blood pressure, rate of perceived exertion, and shortness of breath were all monitored before, during, and after exercise. Cathy Hensley presented with no problems at today's exercise session. She attended stress management class today.  The patient did not have an increase in workload intensity during today's exercise session.  Pre-exercise vitals: . Weight kg: 52.6 . Liters of O2: 3 . SpO2: 96 . HR: 83 . BP: 126/56 . CBG: NA  Exercise vitals: . Highest heartrate:  111 . Lowest oxygen saturation: 94 . Highest blood pressure: 152/56 . Liters of 02: 3  Post-exercise vitals: . SpO2: 97 . HR: 83 . BP: 130/80 . Liters of O2: 3 . CBG: NA Dr. Alyson Reedy, Medical Director [name] is immediately available during today's Pulmonary Rehab session for [facility] on 01/16/2015  at 1030 class time.  Marland Kitchen

## 2015-01-16 NOTE — Progress Notes (Signed)
Pt completed Quality of Life survey as a participant in Pulmonary Rehab. Scores 21.0 or below are considered low. Pt score very low in several areas Overall 17.65, Health and Function 9.8, physiological and spiritual 16.14.  Discussed results with patient. She is very optimistic that once her health improves with exercise, then her quality of life will improve significantly. She also feels that once she learns to manage her disease thru education that this will also improve her overall health outlook. Will continue to follow patients quality of life.

## 2015-01-20 ENCOUNTER — Telehealth: Payer: Self-pay | Admitting: Pulmonary Disease

## 2015-01-20 NOTE — Telephone Encounter (Signed)
Spoke with Cathy Hensley. States that she had a six minute walk on 01/14/15 at pulmonary rehab. She is scheduled for another six minute walk here at our office on 01/22/15. Cathy Hensley is worried that this will not be covered since she just had one last week. Advised her that I would speak with Marisue Ivan about this and give her a call back.  Spoke with Marisue Ivan. She states that there is no way of knowing if this would be covered or not until it's charged for. It's not an expensive test so if it's covered more than likely it would be written off.  Cathy Hensley is aware of this information. Nothing further was needed.

## 2015-01-21 ENCOUNTER — Encounter (HOSPITAL_COMMUNITY)
Admission: RE | Admit: 2015-01-21 | Discharge: 2015-01-21 | Disposition: A | Discharge status: Home / Self Care | Payer: Medicare Other | Source: Ambulatory Visit | Attending: Pulmonary Disease | Admitting: Pulmonary Disease

## 2015-01-21 ENCOUNTER — Telehealth: Payer: Self-pay | Admitting: Internal Medicine

## 2015-01-21 DIAGNOSIS — J449 Chronic obstructive pulmonary disease, unspecified: Secondary | ICD-10-CM | POA: Diagnosis not present

## 2015-01-21 NOTE — Progress Notes (Signed)
Today, Semiah exercised at [employer]. Service time was from 1030 to 1210.  The patient exercised by performing aerobic, strengthening, and stretching exercises. Oxygen saturation, heart rate, blood pressure, rate of perceived exertion, and shortness of breath were all monitored before, during, and after exercise. Cathy Hensley presented with no problems at today's exercise session, however she continued to be hypertensive during exercise. Her primary care physician was notified. Awaiting return call.  The patient did not have an increase in workload intensity during today's exercise session.  Pre-exercise vitals: . Weight kg: 52.9 . Liters of O2: 3L . SpO2: 99 . HR: 70 . BP: 136/56 . CBG: na  Exercise vitals: . Highest heartrate:  115 . Lowest oxygen saturation: 88 . Highest blood pressure: 180/60 . Liters of 02: ra  Post-exercise vitals: . SpO2: 98 . HR: 89 . BP: 112/60 . Liters of O2: 3L . CBG: na  Dr. Alyson Reedy, Medical Director Dr. Konrad Dolores is immediately available during today's Pulmonary Rehab session for [facility] on 01/21/2015 at 1030 class time

## 2015-01-21 NOTE — Telephone Encounter (Signed)
Cathy Hensley called from pulmonary because they were wanting to do pulmonary therapy with the patient for her COPD. The patient stated that Dr. Amador Cunas manages her COPD. They just wanted to let you know that her BP is 196/70 while exercising. They had her doing a 6 minute walk and a 2 day exercise  And her pressure is between 50 to 200. They didn't know if she needed to make an appointment to adjust her medication.

## 2015-01-21 NOTE — Telephone Encounter (Signed)
No change in blood pressure medication Continue to monitor.  We'll reassess next visit

## 2015-01-21 NOTE — Telephone Encounter (Signed)
Please see message and advise 

## 2015-01-22 ENCOUNTER — Ambulatory Visit (INDEPENDENT_AMBULATORY_CARE_PROVIDER_SITE_OTHER): Payer: Medicare Other | Admitting: Pulmonary Disease

## 2015-01-22 ENCOUNTER — Encounter: Payer: Self-pay | Admitting: Pulmonary Disease

## 2015-01-22 VITALS — BP 124/60 | HR 87 | Ht 64.0 in | Wt 116.0 lb

## 2015-01-22 DIAGNOSIS — J302 Other seasonal allergic rhinitis: Secondary | ICD-10-CM | POA: Diagnosis not present

## 2015-01-22 DIAGNOSIS — Z148 Genetic carrier of other disease: Secondary | ICD-10-CM | POA: Insufficient documentation

## 2015-01-22 DIAGNOSIS — E8801 Alpha-1-antitrypsin deficiency: Secondary | ICD-10-CM

## 2015-01-22 DIAGNOSIS — J309 Allergic rhinitis, unspecified: Secondary | ICD-10-CM | POA: Insufficient documentation

## 2015-01-22 DIAGNOSIS — J9611 Chronic respiratory failure with hypoxia: Secondary | ICD-10-CM | POA: Diagnosis not present

## 2015-01-22 DIAGNOSIS — J449 Chronic obstructive pulmonary disease, unspecified: Secondary | ICD-10-CM

## 2015-01-22 DIAGNOSIS — J9612 Chronic respiratory failure with hypercapnia: Secondary | ICD-10-CM

## 2015-01-22 LAB — PULMONARY FUNCTION TEST
DL/VA % pred: 47 %
DL/VA: 2.32 ml/min/mmHg/L
DLCO unc % pred: 27 %
DLCO unc: 6.92 ml/min/mmHg
FEF 25-75 POST: 0.26 L/s
FEF 25-75 Pre: 0.23 L/sec
FEF2575-%CHANGE-POST: 10 %
FEF2575-%PRED-POST: 14 %
FEF2575-%PRED-PRE: 13 %
FEV1-%Change-Post: 0 %
FEV1-%PRED-POST: 26 %
FEV1-%PRED-PRE: 26 %
FEV1-POST: 0.59 L
FEV1-PRE: 0.58 L
FEV1FVC-%CHANGE-POST: -2 %
FEV1FVC-%Pred-Pre: 49 %
FEV6-%Change-Post: 2 %
FEV6-%PRED-PRE: 52 %
FEV6-%Pred-Post: 54 %
FEV6-Post: 1.52 L
FEV6-Pre: 1.48 L
FEV6FVC-%Change-Post: 0 %
FEV6FVC-%Pred-Post: 98 %
FEV6FVC-%Pred-Pre: 98 %
FVC-%Change-Post: 3 %
FVC-%PRED-PRE: 53 %
FVC-%Pred-Post: 55 %
FVC-POST: 1.63 L
FVC-PRE: 1.58 L
POST FEV1/FVC RATIO: 36 %
POST FEV6/FVC RATIO: 94 %
PRE FEV1/FVC RATIO: 37 %
Pre FEV6/FVC Ratio: 94 %

## 2015-01-22 NOTE — Patient Instructions (Signed)
1. Continue using your Spiriva and nebulizer medications as prescribed. 2. Continue using oxygen at 3 L/m pulse with exertion. We will check an overnight pulse oximetry on room air and contact you with the results. 3. 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 You do not need treatment. However, they should be tested. They can obtain more information from the alpha-1 foundation's website [email or web address]-[email or web address] 4. Please call me if you have any new breathing problems before your next appointment. 5. I will see you back in 6 months or sooner if needed.  TESTS ORDERED: 1. Overnight pulse oximetry on room air

## 2015-01-22 NOTE — Progress Notes (Signed)
PFT done today. 

## 2015-01-22 NOTE — Progress Notes (Signed)
\\  Subjective:    Patient ID: Cathy Hensley, female    DOB: May 15, 1940, 75 y.o.   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  HPI Very Severe COPD:  Started on Brovana at last appointment.  She continues to have dyspnea on exertion. Participating in pulmonary rehab. She reports she has been having more problems with elevated blood pressure following her exercise. Reports compliance with Brovana, Budesonide, & Spiriva. No exacerbations since last appointment. No coughing or wheezing. Rare need of her Xopenex rescue.   Allergic Rhinitis:  She reports she has also had decreased sinus congestion on Singulair. No sinus pressure or pain.  Chronic Hypoxic Respiratory Failure: Patient's walk test today shows an oxygen requirement of 3 L/m pulse dose to maintain saturation with ambulation. There was no requirement of oxygen at rest.  Chronic Hypercarbic Respiratory Failure: Secondary to COPD. Normal mentation per husband.   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  Review of Systems No fever, chills, or sweats. No chest pain or pressure.   No Known Allergies  Current Outpatient Prescriptions on File Prior to Visit  Medication Sig Dispense Refill  . acetaminophen (TYLENOL) 500 MG tablet Take 500 mg by mouth every 6 (six) hours as needed.    Marland Kitchen amLODipine (NORVASC) 5 MG tablet Take 1 tablet (5 mg total) by mouth daily. 90 tablet 3  . amoxicillin-clavulanate (AUGMENTIN) 875-125 MG tablet Take 1 tablet by mouth 2 (two) times daily. 10 tablet 1  . arformoterol (BROVANA) 15 MCG/2ML NEBU Take 2 mLs (15 mcg total) by nebulization 2 (two) times daily. 120 mL 3  . aspirin 81 MG chewable tablet Chew 1 tablet (81 mg total) by mouth daily.    . budesonide (PULMICORT) 0.5 MG/2ML nebulizer solution Take 2 mLs (0.5 mg total) by nebulization 2 (two) times daily. 60 mL 12    . furosemide (LASIX) 20 MG tablet 1 tablet daily as needed for fluid retention 30 tablet 3  . levalbuterol (XOPENEX HFA) 45 MCG/ACT inhaler Inhale 2 puffs into the lungs every 4 (four) hours as needed for wheezing. 1 Inhaler 3  . levalbuterol (XOPENEX) 0.63 MG/3ML nebulizer solution Take 3 mLs (0.63 mg total) by nebulization every 3 (three) hours as needed for wheezing. 3 mL 12  . montelukast (SINGULAIR) 10 MG tablet TAKE 1 TABLET BY MOUTH AT BEDTIME 30 tablet 5  . simvastatin (ZOCOR) 20 MG tablet Take 1 tablet (20 mg total) by mouth at bedtime. 90 tablet 3  . tiotropium (SPIRIVA) 18 MCG inhalation capsule Place 1 capsule (18 mcg total) into inhaler and inhale daily. 30 capsule 12   No current facility-administered medications on file prior to visit.    Past Medical History  Diagnosis Date  . COPD 07/13/2006  . HYPERTENSION 07/13/2006  . MENOPAUSAL SYNDROME 07/13/2006  . Other and unspecified hyperlipidemia 07/13/2006  . Respiratory failure (HCC)     Chronic hypoxic    Past Surgical History  Procedure Laterality Date  . Tonsillectomy      Family History  Problem Relation Age of Onset  . AAA (abdominal aortic aneurysm) Mother   . Emphysema Mother   . AAA (abdominal aortic aneurysm) Sister   . Aneurysm Sister   . Emphysema Father     Social History   Social History  . Marital Status: Married    Spouse Name: N/A  . Number of Children: N/A  . Years  of Education: N/A   Social History Main Topics  . Smoking status: Former Smoker -- 0.50 packs/day for 42 years    Types: Cigarettes    Quit date: 09/23/2000  . Smokeless tobacco: Former Neurosurgeon    Quit date: 01/05/2004  . Alcohol Use: 0.6 oz/week    1 Standard drinks or equivalent per week     Comment: 1 glass of wine nearly daily.  . Drug Use: No  . Sexual Activity: Not Asked   Other Topics Concern  . None   Social History Narrative   Lives at home with her husband. Retired.      [facility]:   She is originally from  [place]. She has 2 daughters, one lives in [place] & the other in [place]. Previously worked Clinical cytogeneticist a Musician, ran a Lawyer, clerical work, Lawyer. She has no pets currently. No previous bird, mold, or hot tub exposure.       Objective:   Physical Exam BP 124/60 mmHg  Pulse 87  Ht 5\' 4"  (1.626 m)  Wt 116 lb (52.617 kg)  BMI 19.90 kg/m2  SpO2 93% General:  Appears well. No distress. Accompanied by daughter and husband today.  Integument:  Warm & dry. No rash on exposed skin. No bruising. HEENT:  Moist mucus membranes. No scleral icterus. No oral ulcers. Cardiovascular:  Regular rate. No edema. Normal S1 & S2.  Pulmonary:  Distant and symmetrically decreased breath sounds. Prolonged exhalation phase. Normal work of breathing. Abdomen: Soft. Normal bowel sounds. Nondistended.  Musculoskeletal:  Normal bulk and tone. No joint deformity or effusion appreciated.  PFT 01/22/15: FVC 1.58 L (53%) FEV1 0.58 L (26%) FEV1/FVC 0.37 FEF 25-75 0.23 L (13%) no bronchodilator response 11/05/14:FVC 1.52 L (51%) FEV1 0.49 L (22%) FEV1/FVC 0.32 FEF 25-75 0.20 L (11%) no bronchodilator response ERV 87% DLCO uncorrected 27%  01/22/15:  Walked 192 meters / Baseline Sat 93% on RA / Nadir Sat 70% on RA (maintained saturation with 3 L/m pulse at 90%)  IMAGING Portable CXR 09/03/14 (personally reviewed by me): Hyperinflation with flattening of the diaphragms. No focal opacity or mass appreciated. No pleural effusion or thickening appreciated. Heart normal in size. Mediastinum normal in contour.  CARDIAC TTE (09/04/14): LV normal in size. Moderate LVH. EF 60-65%. No regional wall motion abnormalities. LA & RA normal in size. RV normal in size and function. Pulmonary artery systolic pressure 30 mmHg. No aortic stenosis or regurgitation. Normal aortic root. Mild mitral regurgitation. No pulmonic regurgitation. Trivial tricuspid regurgitation. Trivial pericardial  effusion.  MICROBIOLOGY Sputum (09/06/14): Oral flora Urine Strep Ag (09/06/14): Negative Urine Legionella Ag (09/06/14): Negative Influenza A/B PCR (09/06/14): Negative   LABS 11/06/14 Alpha-1 antitrypsin: MZ (88)  09/06/14 ABG on 3 L/m: 7.40 / 65 / 63 / 91% CBC: 11.5/14.2/46.3/240 BMP: 140/4.4/99/41/19/0.38/118/9.2 LFT: 3.7/6.3/0.5/61/20/21    Assessment & Plan:  75 year old female with very severe COPD and chronic respiratory failure from hypoxia and hypercapnia. Patient spirometry today is at least stable if not mildly improved from previous testing with improved symptom control with the addition of Singulair and Brovana to her regimen. He does: Question whether or not there may be some allergic component to the patient's underlying airways dysfunction. 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 I spent a significant amount of time today discussing her carrier status with both she and her husband who was present recommending that any biological relatives be tested given the potential  for liver and lung disease if deficiency exists. I instructed the patient to contact me if she had any new breathing problems before her next appointment. I encouraged her to continue participating in pulmonary rehabilitation but given the substantial increase in her blood pressure during exercise I do question whether or not a longer duration and more low intensity regimen may benefit the patient. I do not believe that increasing her medications for hypertension are necessary given the potential for causing resting hypotension.  1. Very severe COPD: Continuing patient on budesonide, Brovana, & Spiriva. No changes. Recommend continue participation in pulmonary rehabilitation with lower intensity workout to prevent worsening hypertension. 2. Chronic hypoxic respiratory failure: Patient desaturated to 70% on room air with ambulation today. Continuing the patient  on oxygen at 3 L/m pulse dose with exertion. Checking overnight pulse oximetry on room air. 3. Chronic hypercapnic respiratory failure: Likely secondary to underlying COPD. Minimizing hyperoxygenation which could worsen VQ mismatching. Continuing inhaler treatment of COPD. 4. Allergic rhinitis: Symptomatically improved on singular. No changes. 5. 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 Provided her with website for the alpha-1 foundation and recommended testing of biological relatives. 6. Health maintenance: Previously received influenza, Prevnar, and Pneumovax vaccines.  7. Follow-up: Return to clinic in 6 months or sooner if needed.

## 2015-01-22 NOTE — Telephone Encounter (Signed)
Left detailed message on personal voicemail received message from Pulmonary regarding blood pressure. Dr.K said no change in medication at this time, continue to monitor and will reassess at next visit. Please call office for appt sooner if blood pressure continues to be greater that 150/90. Any questions please call office.

## 2015-01-22 NOTE — Progress Notes (Signed)
Results reviewed

## 2015-01-23 ENCOUNTER — Encounter (HOSPITAL_COMMUNITY)
Admission: RE | Admit: 2015-01-23 | Discharge: 2015-01-23 | Disposition: A | Discharge status: Home / Self Care | Payer: Medicare Other | Source: Ambulatory Visit | Attending: Pulmonary Disease | Admitting: Pulmonary Disease

## 2015-01-23 DIAGNOSIS — J449 Chronic obstructive pulmonary disease, unspecified: Secondary | ICD-10-CM | POA: Diagnosis not present

## 2015-01-23 NOTE — Progress Notes (Signed)
I have reviewed a Home Exercise Prescription with [employer] . Traniece is walking currently for exercise at home.  The patient was advised to walk 2-3 days a week for 30-45 minutes.  Norena and I discussed how to progress their exercise prescription.  The patient stated that their goals were be able to walk on beach in October.  The patient stated that they understand the exercise prescription.  We reviewed exercise guidelines, target heart rate during exercise, oxygen use, weather, home pulse oximeter, endpoints for exercise, and goals.  Patient is encouraged to come to me with any questions. I will continue to follow up with the patient to assist them with progression and safety.   Fabio Pierce, MA, ACSM RCEP

## 2015-01-23 NOTE — Progress Notes (Signed)
Today, [name] exercised at [facility] [employer] [facility]. Service time was from 1030 to 1220.  The patient exercised by performing aerobic, strengthening, and stretching exercises. Oxygen saturation, heart rate, blood pressure, rate of perceived exertion, and shortness of breath were all monitored before, during, and after exercise. Cathy Hensley presented with no problems at today's exercise session.Patient attended the Pulmonary Medications class today.   The patient did not have an increase in workload intensity during today's exercise session.  Pre-exercise vitals: . Weight kg: 52.8 . Liters of O2: 3 . SpO2: 99 . HR: 82 . BP: 120/56 . CBG: na  Exercise vitals: . Highest heartrate:  94 . Lowest oxygen saturation: 99 . Highest blood pressure: 170/70 . Liters of 02: 3  Post-exercise vitals: . SpO2: 95 . HR: 80 . BP: 122/64 . Liters of O2: 3 . CBG: na Dr. Alyson Reedy, Medical Director Dr. Konrad Dolores is immediately available during today's Pulmonary Rehab session for [facility] on 01/23/2015  at 1030 class time  .

## 2015-01-28 ENCOUNTER — Encounter (HOSPITAL_COMMUNITY)
Admission: RE | Admit: 2015-01-28 | Discharge: 2015-01-28 | Disposition: A | Discharge status: Home / Self Care | Payer: Medicare Other | Source: Ambulatory Visit | Attending: Pulmonary Disease | Admitting: Pulmonary Disease

## 2015-01-28 DIAGNOSIS — J449 Chronic obstructive pulmonary disease, unspecified: Secondary | ICD-10-CM | POA: Diagnosis not present

## 2015-01-28 NOTE — Progress Notes (Signed)
Today, Cathy Hensley exercised at [facility] [employer] [facility]. Service time was from 1030 to 1200.  The patient exercised by performing aerobic, strengthening, and stretching exercises. Oxygen saturation, heart rate, blood pressure, rate of perceived exertion, and shortness of breath were all monitored before, during, and after exercise. [name] presented with no problems at today's exercise session.  The patient did have an increase in workload intensity during today's exercise session.  Pre-exercise vitals: . Weight kg: 52.7 . Liters of O2: 3L . SpO2: 98 . HR: 74 . BP: 120/62 . CBG: na  Exercise vitals: . Highest heartrate:  98 . Lowest oxygen saturation: 94 . Highest blood pressure: 184/74 decreased to 164/74 with rest . Liters of 02: 3L  Post-exercise vitals: . SpO2: 97 . HR: 81 . BP: 120/60 . Liters of O2: 3L . CBG: na  Dr. Alyson Reedy, Medical Director Dr. Konrad Dolores is immediately available during today's Pulmonary Rehab session for [facility] on 01/28/2015 at 1030 class time

## 2015-01-28 NOTE — Progress Notes (Signed)
Cathy Hensley 75 y.o. female Nutrition Note Spoke with pt. Pt is underweight for a pulmonary pt.  Pt has intentionally been trying to gain wt, which is appropriate. Pt reports she wants to maintain her wt around 115-120 lb. Pt states she wouldn't be disappointed if she gained wt up to 130 lb. Pt's Rate Your Plate results reviewed with pt. Pt avoids many salty foods; does not use canned/ convenience food frequently. Pt does not add salt to food.  The role of sodium in lung disease reviewed with pt. Pt expressed understanding of the information reviewed. No results found for: HGBA1C Vitals - 1 value per visit 01/22/2015 01/10/2015 01/09/2015 12/16/2014 11/06/2014  Weight (lb) 116 115.52 117.2 115 111.4   Vitals - 1 value per visit 10/01/2014 09/24/2014 09/13/2014 09/07/2014 09/03/2014  Weight (lb) 107 104 103  103.18   Nutrition Diagnosis ? Food-and nutrition-related knowledge deficit related to lack of exposure to information as related to diagnosis of pulmonary disease ? Increased energy expenditure related to increased energy requirements as evidenced by BMI <20 and recent h/o wt loss. ?  Nutrition Intervention ? Pt's individual nutrition plan and goals reviewed with pt. ? Benefits of adopting healthy eating habits discussed when pt's Rate Your Plate reviewed. ? Pt to attend the Nutrition and Lung Disease class ? Continual client-centered nutrition education by RD, as part of interdisciplinary care. Goal(s) 1. The pt will recognize symptoms that can interfere with adequate oral intake, such as shortness of breath, N/V, early satiety, fatigue, ability to secure and prepare food, taste and smell changes, chewing/swallowing difficulties, and/ or pain when eating. 2. Describe the benefit of including fruits, vegetables, whole grains, and low-fat dairy products in a healthy meal plan. Monitor and Evaluate progress toward nutrition goal with team.   Mickle Plumb, M.Ed, RD, LDN, CDE 01/28/2015 12:08 PM

## 2015-01-29 DIAGNOSIS — J449 Chronic obstructive pulmonary disease, unspecified: Secondary | ICD-10-CM | POA: Diagnosis not present

## 2015-01-30 ENCOUNTER — Encounter (HOSPITAL_COMMUNITY)
Admission: RE | Admit: 2015-01-30 | Discharge: 2015-01-30 | Disposition: A | Discharge status: Home / Self Care | Payer: Medicare Other | Source: Ambulatory Visit | Attending: Pulmonary Disease | Admitting: Pulmonary Disease

## 2015-01-30 DIAGNOSIS — J449 Chronic obstructive pulmonary disease, unspecified: Secondary | ICD-10-CM | POA: Diagnosis not present

## 2015-01-30 NOTE — Progress Notes (Signed)
Today, Kelse exercised at [facility]. Service time was from 1030 to 1230.  The patient exercised by performing aerobic, strengthening, and stretching exercises. Oxygen saturation, heart rate, blood pressure, rate of perceived exertion, and shortness of breath were all monitored before, during, and after exercise. Cathy Hensley presented with no problems at today's exercise session. Cathy Hensley also attended an education session on oxygen safety and use.  The patient did not have an increase in workload intensity during today's exercise session.  Pre-exercise vitals: . Weight kg: 52.9 . Liters of O2: 3L . SpO2: 99 . HR: 89 . BP: 134/70 . CBG: na  Exercise vitals: . Highest heartrate:  104 . Lowest oxygen saturation: 3L . Highest blood pressure: 121/70 . Liters of 02: 3L  Post-exercise vitals: . SpO2: 95 . HR: 86 . BP: 116/60 . Liters of O2: 3L . CBG: na  Dr. Alyson Reedy, Medical Director Dr. Malachi Bonds is immediately available during today's Pulmonary Rehab session for [facility] on 01/30/2015 at 1030 class time.

## 2015-02-04 ENCOUNTER — Encounter (HOSPITAL_COMMUNITY)
Admission: RE | Admit: 2015-02-04 | Discharge: 2015-02-04 | Disposition: A | Discharge status: Home / Self Care | Payer: Medicare Other | Source: Ambulatory Visit | Attending: Pulmonary Disease | Admitting: Pulmonary Disease

## 2015-02-04 DIAGNOSIS — J449 Chronic obstructive pulmonary disease, unspecified: Secondary | ICD-10-CM | POA: Diagnosis not present

## 2015-02-04 NOTE — Progress Notes (Signed)
Today, Cyera exercised at [facility] [employer] [facility]. Service time was from 1030 to 1200.  The patient exercised by performing aerobic, strengthening, and stretching exercises. Oxygen saturation, heart rate, blood pressure, rate of perceived exertion, and shortness of breath were all monitored before, during, and after exercise. Jala presented with no problems at today's exercise session.  The patient did not have an increase in workload intensity during today's exercise session.  Pre-exercise vitals: . Weight kg: 52.5 . Liters of O2: 3 . SpO2: 97 . HR: 84 . BP: 136/56 . CBG: NA  Exercise vitals: . Highest heartrate:  108 . Lowest oxygen saturation: 92 . Highest blood pressure: 166/78 . Liters of 02: 3  Post-exercise vitals: . SpO2: 94 . HR: 89 . BP: 112/70 . Liters of O2: 3 . CBG: NA Dr. Alyson Reedy, Medical Director Dr. Konrad Dolores is immediately available during today's Pulmonary Rehab session for [facility] on 02/04/2015  at 1030 class time  .

## 2015-02-06 ENCOUNTER — Encounter (HOSPITAL_COMMUNITY)
Admission: RE | Admit: 2015-02-06 | Discharge: 2015-02-06 | Disposition: A | Discharge status: Home / Self Care | Payer: Medicare Other | Source: Ambulatory Visit | Attending: Pulmonary Disease | Admitting: Pulmonary Disease

## 2015-02-06 DIAGNOSIS — J449 Chronic obstructive pulmonary disease, unspecified: Secondary | ICD-10-CM | POA: Insufficient documentation

## 2015-02-06 NOTE — Progress Notes (Signed)
Today, Cathy Hensley exercised at [facility] [employer] [facility]. Service time was from 1030 to 1220.  The patient exercised by performing aerobic, strengthening, and stretching exercises. Oxygen saturation, heart rate, blood pressure, rate of perceived exertion, and shortness of breath were all monitored before, during, and after exercise. Cathy Hensley presented with an elevated blood pressure while on the treadmill of 200/88 and reduced to 180/80 with rest at today's exercise session.  We decreased her treadmill speed to 2.0 mph.   Patient attended the Diaphragmatic , purse lip breathing and pulmonary meds class today.   The patient did not have an increase in workload intensity during today's exercise session.  Pre-exercise vitals: . Weight kg: 53.7 . Liters of O2: 3 . SpO2: 99 . HR: 75 . BP: 122/82 . CBG: na  Exercise vitals: . Highest heartrate:  118 . Lowest oxygen saturation: 92 . Highest blood pressure: 200/88 . Liters of 02: 3  Post-exercise vitals: . SpO2: 96 . HR: 82 . BP: 122/66 . Liters of O2: 3 . CBG: na Dr. Alyson Reedy, Medical Director Dr. Ardyth Harps is immediately available during today's Pulmonary Rehab session for [facility] on 02/06/2015  at 1030 class time.  Marland Kitchen

## 2015-02-07 ENCOUNTER — Telehealth: Payer: Self-pay | Admitting: *Deleted

## 2015-02-07 DIAGNOSIS — J449 Chronic obstructive pulmonary disease, unspecified: Secondary | ICD-10-CM

## 2015-02-07 NOTE — Telephone Encounter (Signed)
I spoke with patient about results and she verbalized understanding and had no questions Order placed for repeat ono.

## 2015-02-07 NOTE — Telephone Encounter (Signed)
-----   Message from Roslynn Amble, MD sent at 02/06/2015  6:34 PM EST ----- Regarding: New ONO on 2 L/m Please Call Cathy Hensley and let her know her overnight oximetry showed she definitely needs oxygen. Please order a repeat ONO and instruct her to wear her oxygen at 2 L/m continuous flow while sleeping that night. Thanks.

## 2015-02-10 DIAGNOSIS — J449 Chronic obstructive pulmonary disease, unspecified: Secondary | ICD-10-CM | POA: Diagnosis not present

## 2015-02-11 ENCOUNTER — Encounter (HOSPITAL_COMMUNITY)
Admission: RE | Admit: 2015-02-11 | Discharge: 2015-02-11 | Disposition: A | Discharge status: Home / Self Care | Payer: Medicare Other | Source: Ambulatory Visit | Attending: Pulmonary Disease | Admitting: Pulmonary Disease

## 2015-02-11 DIAGNOSIS — J449 Chronic obstructive pulmonary disease, unspecified: Secondary | ICD-10-CM | POA: Diagnosis not present

## 2015-02-11 NOTE — Progress Notes (Signed)
Today, Joey exercised at [facility] [employer] [facility]. Service time was from 1030 to 1200.  The patient exercised by performing aerobic, strengthening, and stretching exercises. Oxygen saturation, heart rate, blood pressure, rate of perceived exertion, and shortness of breath were all monitored before, during, and after exercise. Cathy Hensley presented with no problems at today's exercise session.  The patient did not have an increase in workload intensity during today's exercise session.  Pre-exercise vitals: . Weight kg: 53.7 . Liters of O2: 3 . SpO2: 99 . HR: 82 . BP: 132/54 . CBG: NA  Exercise vitals: . Highest heartrate:  109 . Lowest oxygen saturation: 93 . Highest blood pressure: 174/84 . Liters of 02: 3  Post-exercise vitals: . SpO2: 95 . HR: 96 . BP: 134/62 . Liters of O2: 3 . CBG: NA Dr. Alyson Reedy, Medical Director Dr. Konrad Dolores is immediately available during today's Pulmonary Rehab session for [facility] on 02/11/2015  at 1030 class time  .

## 2015-02-13 ENCOUNTER — Encounter (HOSPITAL_COMMUNITY)
Admission: RE | Admit: 2015-02-13 | Discharge: 2015-02-13 | Disposition: A | Discharge status: Home / Self Care | Payer: Medicare Other | Source: Ambulatory Visit | Attending: Pulmonary Disease | Admitting: Pulmonary Disease

## 2015-02-13 DIAGNOSIS — J449 Chronic obstructive pulmonary disease, unspecified: Secondary | ICD-10-CM | POA: Diagnosis not present

## 2015-02-13 NOTE — Progress Notes (Signed)
Today, Suriyah exercised at [facility] [facility]. Service time was from 1030 to 1230.  The patient exercised by performing aerobic, strengthening, and stretching exercises. Oxygen saturation, heart rate, blood pressure, rate of perceived exertion, and shortness of breath were all monitored before, during, and after exercise. Cathy Hensley presented with no problems at today's exercise session. She attended [employer] oxygen education class today.  The patient did not have an increase in workload intensity during today's exercise session.  Pre-exercise vitals: . Weight kg: 53.7 . Liters of O2: 3 . SpO2: 98 . HR: 69 . BP: 110/64 . CBG: NA  Exercise vitals: . Highest heartrate:  92 . Lowest oxygen saturation: 96 . Highest blood pressure: 152/74 . Liters of 02: 3  Post-exercise vitals: . SpO2: 97 . HR: 76 . BP: 110/60 . Liters of O2: 3 . CBG: NA Dr. Alyson Reedy, Medical Director [name] is immediately available during today's Pulmonary Rehab session for [facility] on 02/13/2015  at 1030 class time.  Marland Kitchen

## 2015-02-14 ENCOUNTER — Encounter: Payer: Self-pay | Admitting: Pulmonary Disease

## 2015-02-18 ENCOUNTER — Encounter (HOSPITAL_COMMUNITY): Payer: Medicare Other

## 2015-02-20 ENCOUNTER — Encounter (HOSPITAL_COMMUNITY)
Admission: RE | Admit: 2015-02-20 | Discharge: 2015-02-20 | Disposition: A | Discharge status: Home / Self Care | Payer: Medicare Other | Source: Ambulatory Visit | Attending: Pulmonary Disease | Admitting: Pulmonary Disease

## 2015-02-20 DIAGNOSIS — J449 Chronic obstructive pulmonary disease, unspecified: Secondary | ICD-10-CM | POA: Diagnosis not present

## 2015-02-20 NOTE — Progress Notes (Signed)
Today, [name] exercised at [facility] [employer] [facility]. Service time was from 1030 to 1200.  The patient exercised by performing aerobic, strengthening, and stretching exercises. Oxygen saturation, heart rate, blood pressure, rate of perceived exertion, and shortness of breath were all monitored before, during, and after exercise. Cathy Hensley presented with no problems at today's exercise session. She attended warning signs and symptoms class today.  The patient did  have an increase in workload intensity during today's exercise session.  Pre-exercise vitals: . Weight kg: 53.4 . Liters of O2: 3 . SpO2: 99 . HR: 73 . BP: 120/66 . CBG: NA  Exercise vitals: . Highest heartrate:  93 . Lowest oxygen saturation: 94 . Highest blood pressure: 144/70 . Liters of 02: 3  Post-exercise vitals: . SpO2: 97 . HR: 86 . BP: 110/60 . Liters of O2: 3 . CBG: NA Dr. Alyson Reedy, Medical Director Dr. Benjamine Mola is immediately available during today's Pulmonary Rehab session for [facility] on 02/20/2015  at 1030 class time  .

## 2015-02-25 ENCOUNTER — Encounter (HOSPITAL_COMMUNITY)
Admission: RE | Admit: 2015-02-25 | Discharge: 2015-02-25 | Disposition: A | Discharge status: Home / Self Care | Payer: Medicare Other | Source: Ambulatory Visit | Attending: Pulmonary Disease | Admitting: Pulmonary Disease

## 2015-02-25 DIAGNOSIS — J449 Chronic obstructive pulmonary disease, unspecified: Secondary | ICD-10-CM | POA: Diagnosis not present

## 2015-02-25 NOTE — Progress Notes (Signed)
Today, Yides exercised at [facility] [facility] [facility]. Service time was from 1030 to 1200.  The patient exercised by performing aerobic, strengthening, and stretching exercises. Oxygen saturation, heart rate, blood pressure, rate of perceived exertion, and shortness of breath were all monitored before, during, and after exercise. Ellarose presented with no problems at today's exercise session.  The patient did  have an increase in workload intensity during today's exercise session.  Pre-exercise vitals: . Weight kg: 53.2 . Liters of O2: 3 . SpO2: 100 . HR: 72 . BP: 114/72 . CBG: NA  Exercise vitals: . Highest heartrate:  95 . Lowest oxygen saturation: 92 . Highest blood pressure: 146/76 . Liters of 02: 3  Post-exercise vitals: . SpO2: 98 . HR: 86 . BP: 110/60 . Liters of O2: 3 . CBG: NA Dr. Alyson Reedy, Medical Director Dr. Konrad Dolores is immediately available during today's Pulmonary Rehab session for [facility] on 02/25/2015  at 1030 class time  .

## 2015-02-26 DIAGNOSIS — J449 Chronic obstructive pulmonary disease, unspecified: Secondary | ICD-10-CM | POA: Diagnosis not present

## 2015-02-27 ENCOUNTER — Encounter (HOSPITAL_COMMUNITY)
Admission: RE | Admit: 2015-02-27 | Discharge: 2015-02-27 | Disposition: A | Discharge status: Home / Self Care | Payer: Medicare Other | Source: Ambulatory Visit | Attending: Pulmonary Disease | Admitting: Pulmonary Disease

## 2015-02-27 DIAGNOSIS — J449 Chronic obstructive pulmonary disease, unspecified: Secondary | ICD-10-CM | POA: Diagnosis not present

## 2015-02-27 NOTE — Progress Notes (Addendum)
Today, Cathy Hensley exercised at [facility] [facility] [facility]. Service time was from 100 to 1215.  The patient exercised by performing aerobic, strengthening, and stretching exercises. Oxygen saturation, heart rate, blood pressure, rate of perceived exertion, and shortness of breath were all monitored before, during, and after exercise. Cathy Hensley presented with no problems at today's exercise session. She attended exercise for the pulmonary patient today.  The patient did  have an increase in workload intensity during today's exercise session.  Pre-exercise vitals: . Weight kg: 53.7 . Liters of O2: 3 . SpO2: 100 . HR: 2 . BP: 126/62 . CBG: NA  Exercise vitals: . Highest heartrate:  97 . Lowest oxygen saturation: 97 . Highest blood pressure: 148/70 . Liters of 02: 3  Post-exercise vitals: . SpO2: 97 . HR: 84 . BP: 118/70 . Liters of O2: RA . CBG: NA Dr. Alyson Reedy, Medical Director [name] is immediately available during today's Pulmonary Rehab session for [facility] on 02/27/2015  at 1030 class time.  Marland Kitchen

## 2015-03-04 ENCOUNTER — Encounter (HOSPITAL_COMMUNITY)
Admission: RE | Admit: 2015-03-04 | Discharge: 2015-03-04 | Disposition: A | Discharge status: Home / Self Care | Payer: Medicare Other | Source: Ambulatory Visit | Attending: Pulmonary Disease | Admitting: Pulmonary Disease

## 2015-03-04 DIAGNOSIS — J449 Chronic obstructive pulmonary disease, unspecified: Secondary | ICD-10-CM | POA: Diagnosis not present

## 2015-03-04 NOTE — Progress Notes (Signed)
Daily Session Note  Patient Details  Name: Cathy Hensley MRN: [id number] Date of Birth: 1940-04-18 Referring Provider:  Marletta Lor, MD  Encounter Date: 03/04/2015  Check In:     Session Check In - 03/04/15 1019    Check-In   Location [facility]   Staff Present Ramon Dredge, RN, MHA;Portia Rollene Rotunda, RN, Levie Heritage, MA, ACSM RCEP, Exercise Physiologist;Isel Skufca Leonia Reeves, RN, BSN   Supervising physician immediately available to respond to emergencies [facility] immediately available   Physician(s) Dr. Marily Memos   Medication changes reported     No   Fall or balance concerns reported    No   Warm-up and Cool-down Performed as group-led instruction   Resistance Training Performed Yes   Pain Assessment   Currently in Pain? No/denies      Capillary Blood Glucose: No results found for this or any previous visit (from the past 24 hour(s)).      Exercise Prescription Changes - 03/04/15 1200    Response to Exercise   Blood Pressure (Admit) 118/56 mmHg   Blood Pressure (Exercise) 144/70 mmHg   Blood Pressure (Exit) 120/54 mmHg   Heart Rate (Admit) 69 bpm   Heart Rate (Exercise) 100 bpm   Heart Rate (Exit) 85 bpm   Oxygen Saturation (Admit) 100 %   Oxygen Saturation (Exercise) 94 %   Oxygen Saturation (Exit) 99 %   Rating of Perceived Exertion (Exercise) 13   Perceived Dyspnea (Exercise) 1   Symptoms none   Comments none   Frequency Add 1 additional day to program exercise sessions.   Duration Progress to 45 minutes of aerobic exercise without signs/symptoms of physical distress   Intensity Other (comment)  40-80 % HRR   Progression   Progression Continue progressive overload as per policy without signs/symptoms or physical distress.   Resistance Training   Training Prescription Yes   Weight bands   Reps 10-12   Interval Training   Interval Training No   Oxygen   Oxygen Continuous   Liters 3   Treadmill   MPH 2.7   Grade 1   Minutes 17   Recumbant Bike   Level 2   Minutes 17   NuStep   Level 4   METs 2.5     Goals Met:  Proper associated with RPD/PD & O2 Sat Independence with exercise equipment Improved SOB with ADL's Using PLB without cueing & demonstrates good technique Exercise tolerated well Strength training completed today  Goals Unmet:  Not Applicable  Comments:    Dr. Rush Farmer is Medical Director for Pulmonary Rehab at [facility].

## 2015-03-06 ENCOUNTER — Encounter (HOSPITAL_COMMUNITY)
Admission: RE | Admit: 2015-03-06 | Discharge: 2015-03-06 | Disposition: A | Discharge status: Home / Self Care | Payer: Medicare Other | Source: Ambulatory Visit | Attending: Pulmonary Disease | Admitting: Pulmonary Disease

## 2015-03-06 DIAGNOSIS — J449 Chronic obstructive pulmonary disease, unspecified: Secondary | ICD-10-CM | POA: Diagnosis not present

## 2015-03-06 NOTE — Progress Notes (Signed)
Daily Session Note  Patient Details  Name: Cathy Hensley MRN: [id number] Date of Birth: 1940/04/26 Referring Provider:  Marletta Lor, MD  Encounter Date: 03/06/2015  Check In:     Session Check In - 03/06/15 1129    Check-In   [facility]   Staff Present Rosebud Poles, RN, Luisa Hart, RN, BSN;Ramon Dredge, RN, MHA;Jessica Luan Pulling, MA, ACSM RCEP, Exercise Physiologist   Supervising physician immediately available to respond to emergencies [facility] immediately available   Physician(s) Dr. Marily Memos   Medication changes reported     No   Fall or balance concerns reported    No   Warm-up and Cool-down Performed as group-led instruction   Resistance Training Performed Yes   VAD Patient? No   Pain Assessment   Currently in Pain? No/denies   Multiple Pain Sites No      Capillary Blood Glucose: No results found for this or any previous visit (from the past 24 hour(s)).      Exercise Prescription Changes - 03/06/15 1300    Response to Exercise   Blood Pressure (Admit) 140/62 mmHg   Blood Pressure (Exercise) 180/82 mmHg   Blood Pressure (Exit) 134/64 mmHg   Heart Rate (Admit) 68 bpm   Heart Rate (Exercise) 106 bpm   Heart Rate (Exit) 88 bpm   Oxygen Saturation (Admit) 100 %   Oxygen Saturation (Exercise) 96 %   Oxygen Saturation (Exit) 98 %   Rating of Perceived Exertion (Exercise) 13   Perceived Dyspnea (Exercise) 2   Symptoms none   Comments none   Duration Progress to 45 minutes of aerobic exercise without signs/symptoms of physical distress   Intensity Other (comment)  40-80 % HRR   Progression   Progression Continue progressive overload as per policy without signs/symptoms or physical distress.   Resistance Training   Training Prescription Yes   Weight orange bands   Reps 10-12   Interval Training   Interval Training No   Oxygen   Oxygen Continuous   Liters 3   Treadmill   MPH 2.5   Grade 1   Minutes 15   NuStep   Level 4   Minutes 15   METs 2.5     Goals Met:  Improved SOB with ADL's Using PLB without cueing & demonstrates good technique Exercise tolerated well No report of cardiac concerns or symptoms Strength training completed today  Goals Unmet:  Not Applicable  Comments: Service time is from 1030 to 1230, see paper ITP for education    Dr. Rush Farmer is Medical Director for Pulmonary Rehab at [facility].

## 2015-03-10 DIAGNOSIS — J449 Chronic obstructive pulmonary disease, unspecified: Secondary | ICD-10-CM | POA: Diagnosis not present

## 2015-03-11 ENCOUNTER — Encounter (HOSPITAL_COMMUNITY)
Admission: RE | Admit: 2015-03-11 | Discharge: 2015-03-11 | Disposition: A | Discharge status: Home / Self Care | Payer: Medicare Other | Source: Ambulatory Visit | Attending: Pulmonary Disease | Admitting: Pulmonary Disease

## 2015-03-11 DIAGNOSIS — J449 Chronic obstructive pulmonary disease, unspecified: Secondary | ICD-10-CM | POA: Diagnosis not present

## 2015-03-11 NOTE — Progress Notes (Signed)
Daily Session Note  Patient Details  Name: Cathy Hensley MRN: [id number] Date of Birth: 1940-03-28 Referring Provider:  Marletta Lor, MD  Encounter Date: 03/11/2015  Check In:     Session Check In - 03/11/15 1023    Check-In   [facility]   Staff Present Rosebud Poles, RN, Levie Heritage, MA, ACSM RCEP, Exercise Physiologist;Randolf Sansoucie Rollene Rotunda, RN, BSN   Supervising physician immediately available to respond to emergencies [facility] immediately available   Physician(s) Dr. Marily Memos   Medication changes reported     No   Fall or balance concerns reported    No   Warm-up and Cool-down Performed as group-led instruction   Resistance Training Performed Yes   VAD Patient? No   Pain Assessment   Currently in Pain? No/denies   Multiple Pain Sites No      Capillary Blood Glucose: No results found for this or any previous visit (from the past 24 hour(s)).      Exercise Prescription Changes - 03/11/15 1200    Exercise Review   Progression Yes   Response to Exercise   Blood Pressure (Admit) 130/80 mmHg   Blood Pressure (Exercise) 120/74 mmHg   Blood Pressure (Exit) 104/60 mmHg   Heart Rate (Admit) 74 bpm   Heart Rate (Exercise) 103 bpm   Heart Rate (Exit) 86 bpm   Oxygen Saturation (Admit) 100 %   Oxygen Saturation (Exercise) 91 %   Oxygen Saturation (Exit) 97 %   Rating of Perceived Exertion (Exercise) 13   Perceived Dyspnea (Exercise) 2   Symptoms none   Comments none   Duration Progress to 45 minutes of aerobic exercise without signs/symptoms of physical distress   Intensity Other (comment)  40-80 % HRR   Progression   Progression Continue progressive overload as per policy without signs/symptoms or physical distress.   Resistance Training   Training Prescription Yes   Weight orange bands   Reps 10-12   Interval Training   Interval Training No   Oxygen   Oxygen Continuous   Liters 3   Treadmill   MPH 2.7   Grade 1   Minutes 15   Recumbant Bike   Level 2   Minutes 15   NuStep   Level 5   Minutes 15   METs 2.4     Goals Met:  Independence with exercise equipment Improved SOB with ADL's Using PLB without cueing & demonstrates good technique Exercise tolerated well No report of cardiac concerns or symptoms Strength training completed today  Goals Unmet:  Not Applicable  Comments: Service time is from 1030 to 1200   Dr. Rush Farmer is Medical Director for Pulmonary Rehab at [facility].

## 2015-03-13 ENCOUNTER — Encounter (HOSPITAL_COMMUNITY)
Admission: RE | Admit: 2015-03-13 | Discharge: 2015-03-13 | Disposition: A | Discharge status: Home / Self Care | Payer: Medicare Other | Source: Ambulatory Visit | Attending: Pulmonary Disease | Admitting: Pulmonary Disease

## 2015-03-13 DIAGNOSIS — J449 Chronic obstructive pulmonary disease, unspecified: Secondary | ICD-10-CM | POA: Diagnosis not present

## 2015-03-13 NOTE — Progress Notes (Signed)
Daily Session Note  Patient Details  Name: Cathy Hensley MRN: [id number] Date of Birth: 12-11-1940 Referring Provider:  Marletta Lor, MD  Encounter Date: 03/13/2015  Check In:     Session Check In - 03/13/15 1013    Check-In   [facility]   Staff Present Rosebud Poles, RN, Luisa Hart, RN, Levie Heritage, MA, ACSM RCEP, Exercise Physiologist   Supervising physician immediately available to respond to emergencies [facility] immediately available   Physician(s)  Dr. Waldron Labs   Medication changes reported     No   Fall or balance concerns reported    No   Warm-up and Cool-down Performed as group-led instruction   Resistance Training Performed Yes   VAD Patient? No   Pain Assessment   Currently in Pain? No/denies   Multiple Pain Sites No      Capillary Blood Glucose: No results found for this or any previous visit (from the past 24 hour(s)).      Exercise Prescription Changes - 03/13/15 1200    Exercise Review   Progression Yes   Response to Exercise   Blood Pressure (Admit) 110/60 mmHg   Blood Pressure (Exercise) 160/80 mmHg   Blood Pressure (Exit) 122/72 mmHg   Heart Rate (Admit) 72 bpm   Heart Rate (Exercise) 104 bpm   Heart Rate (Exit) 82 bpm   Oxygen Saturation (Admit) 100 %   Oxygen Saturation (Exercise) 95 %   Oxygen Saturation (Exit) 98 %   Rating of Perceived Exertion (Exercise) 11   Perceived Dyspnea (Exercise) 1   Symptoms none   Comments none   Duration Progress to 45 minutes of aerobic exercise without signs/symptoms of physical distress   Intensity THRR unchanged   Progression   Progression Continue progressive overload as per policy without signs/symptoms or physical distress.   Resistance Training   Training Prescription Yes   Weight orange bands   Reps 10-12   Interval Training   Interval Training No   Oxygen   Oxygen Continuous   Liters 3   Treadmill   MPH 2.7   Grade 1   Minutes 15   Recumbant Bike   Level 2   Minutes 15     Goals Met:  Independence with exercise equipment Improved SOB with ADL's Using PLB without cueing & demonstrates good technique Exercise tolerated well No report of cardiac concerns or symptoms  Goals Unmet:  Not Applicable  Comments: Service time is from 1030 to 1215, see paper ITP for education   Dr. Rush Farmer is Medical Director for Pulmonary Rehab at [facility].

## 2015-03-14 LAB — COLOGUARD: Cologuard: POSITIVE

## 2015-03-14 LAB — FECAL OCCULT BLOOD, GUAIAC: FECAL OCCULT BLD: POSITIVE

## 2015-03-18 ENCOUNTER — Encounter (HOSPITAL_COMMUNITY)
Admission: RE | Admit: 2015-03-18 | Discharge: 2015-03-18 | Disposition: A | Discharge status: Home / Self Care | Payer: Medicare Other | Source: Ambulatory Visit | Attending: Pulmonary Disease | Admitting: Pulmonary Disease

## 2015-03-18 DIAGNOSIS — J449 Chronic obstructive pulmonary disease, unspecified: Secondary | ICD-10-CM | POA: Diagnosis not present

## 2015-03-18 NOTE — Progress Notes (Signed)
Daily Session Note  Patient Details  Name: Cathy Hensley MRN: [id number] Date of Birth: 1940/02/28 Referring Provider:  Marletta Lor, MD  Encounter Date: 03/18/2015  Check In:     Session Check In - 03/18/15 1017    Check-In   [facility]   Staff Present Rosebud Poles, RN, Luisa Hart, RN, Levie Heritage, MA, ACSM RCEP, Exercise Physiologist;Annedrea Rosezella [place], RN, [facility]   Supervising physician immediately available to respond to emergencies [facility] immediately available   Physician(s) Dr. Marily Memos   Medication changes reported     No   Fall or balance concerns reported    No   Warm-up and Cool-down Performed as group-led instruction   Resistance Training Performed Yes   VAD Patient? No   Pain Assessment   Currently in Pain? No/denies      Capillary Blood Glucose: No results found for this or any previous visit (from the past 24 hour(s)).      Exercise Prescription Changes - 03/18/15 1200    Exercise Review   Progression Yes   Response to Exercise   Blood Pressure (Admit) 102/60 mmHg   Blood Pressure (Exercise) 130/70 mmHg   Blood Pressure (Exit) 122/60 mmHg   Heart Rate (Admit) 68 bpm   Heart Rate (Exercise) 99 bpm   Heart Rate (Exit) 89 bpm   Oxygen Saturation (Admit) 99 %   Oxygen Saturation (Exercise) 94 %   Oxygen Saturation (Exit) 99 %   Rating of Perceived Exertion (Exercise) 11   Perceived Dyspnea (Exercise) 1   Duration Progress to 45 minutes of aerobic exercise without signs/symptoms of physical distress   Intensity THRR unchanged   Progression   Progression Continue to progress workloads to maintain intensity without signs/symptoms of physical distress.   Resistance Training   Training Prescription Yes   Weight orange bands   Reps 10-12   Interval Training   Interval Training No   Oxygen   Oxygen Continuous   Liters 3   Treadmill   MPH 2.7   Grade 1   Minutes 15   Recumbant Bike   Level 3   Minutes 15   NuStep   Level 5   Minutes 15   METs 1.9     Goals Met:  Proper associated with RPD/PD & O2 Sat Independence with exercise equipment Improved SOB with ADL's Using PLB without cueing & demonstrates good technique Exercise tolerated well No report of cardiac concerns or symptoms Strength training completed today  Goals Unmet:  Not Applicable  Comments: Service time is from 1030 to 1230    Dr. Rush Farmer is Medical Director for Pulmonary Rehab at [facility].

## 2015-03-20 ENCOUNTER — Encounter (HOSPITAL_COMMUNITY)
Admission: RE | Admit: 2015-03-20 | Discharge: 2015-03-20 | Disposition: A | Discharge status: Home / Self Care | Payer: Medicare Other | Source: Ambulatory Visit | Attending: Pulmonary Disease | Admitting: Pulmonary Disease

## 2015-03-20 DIAGNOSIS — J449 Chronic obstructive pulmonary disease, unspecified: Secondary | ICD-10-CM | POA: Diagnosis not present

## 2015-03-20 NOTE — Progress Notes (Signed)
Daily Session Note  Patient Details  Name: Cathy Hensley MRN: [id number] Date of Birth: 1940-06-09 Referring Provider:  Marletta Lor, MD  Encounter Date: 03/20/2015  Check In:     Session Check In - 03/20/15 1014    Check-In   [facility]   Staff Present Rosebud Poles, RN, Levie Heritage, MA, ACSM RCEP, Exercise Physiologist;Lisa Ysidro Evert, Felipe Drone, RN, MHA;Portia Rollene Rotunda, RN, BSN   Supervising physician immediately available to respond to emergencies [facility] immediately available   Physician(s) Dr. Jerilee Hoh   Medication changes reported     No   Fall or balance concerns reported    No   Warm-up and Cool-down Performed as group-led instruction   Resistance Training Performed Yes   VAD Patient? No   Pain Assessment   Currently in Pain? No/denies   Multiple Pain Sites No      Capillary Blood Glucose: No results found for this or any previous visit (from the past 24 hour(s)).      Exercise Prescription Changes - 03/20/15 1300    Exercise Review   Progression No   Response to Exercise   Blood Pressure (Admit) 120/64 mmHg   Blood Pressure (Exercise) 130/68 mmHg   Blood Pressure (Exit) 130/72 mmHg   Heart Rate (Admit) 73 bpm   Heart Rate (Exercise) 101 bpm   Heart Rate (Exit) 81 bpm   Oxygen Saturation (Admit) 93 %   Oxygen Saturation (Exercise) 94 %   Oxygen Saturation (Exit) 97 %   Rating of Perceived Exertion (Exercise) 11   Perceived Dyspnea (Exercise) 0   Duration Progress to 45 minutes of aerobic exercise without signs/symptoms of physical distress   Intensity THRR unchanged   Progression   Progression Continue to progress workloads to maintain intensity without signs/symptoms of physical distress.   Resistance Training   Training Prescription Yes   Weight orange bands   Reps 10-12   Interval Training   Interval Training No   Oxygen   Oxygen Continuous   Liters 3   Recumbant Bike   Level 3   Minutes 15   NuStep   Level 5   Minutes 15   METs 2.2     Goals Met:  Independence with exercise equipment Improved SOB with ADL's Using PLB without cueing & demonstrates good technique Exercise tolerated well Strength training completed today  Goals Unmet:  Not Applicable  Comments: Service time is from 1030 to 1240  See paper chart for education topic today.  Dr. Rush Farmer is Medical Director for Pulmonary Rehab at [facility].

## 2015-03-25 ENCOUNTER — Encounter (HOSPITAL_COMMUNITY)
Admission: RE | Admit: 2015-03-25 | Discharge: 2015-03-25 | Disposition: A | Discharge status: Home / Self Care | Payer: Medicare Other | Source: Ambulatory Visit | Attending: Pulmonary Disease | Admitting: Pulmonary Disease

## 2015-03-25 DIAGNOSIS — J449 Chronic obstructive pulmonary disease, unspecified: Secondary | ICD-10-CM | POA: Diagnosis not present

## 2015-03-25 NOTE — Progress Notes (Signed)
Daily Session Note  Patient Details  Name: Cathy Hensley MRN: [id number] Date of Birth: 10/10/1940 Referring Provider:  Marletta Lor, MD  Encounter Date: 03/25/2015  Check In:     Session Check In - 03/25/15 1235    Check-In   [facility]   Staff Present Rosebud Poles, RN, Levie Heritage, MA, ACSM RCEP, Exercise Physiologist;Portia Rollene Rotunda, RN, BSN;Ramon Dredge, RN, [facility]   Supervising physician immediately available to respond to emergencies [facility] immediately available   Physician(s) Dr. Marily Memos   Medication changes reported     No   Fall or balance concerns reported    No   Warm-up and Cool-down Performed as group-led instruction   Resistance Training Performed Yes   VAD Patient? No   Pain Assessment   Currently in Pain? No/denies   Multiple Pain Sites No      Capillary Blood Glucose: No results found for this or any previous visit (from the past 24 hour(s)).      Exercise Prescription Changes - 03/25/15 1200    Exercise Review   Progression No   Response to Exercise   Blood Pressure (Admit) 110/60 mmHg   Blood Pressure (Exercise) 160/80 mmHg   Blood Pressure (Exit) 110/60 mmHg   Heart Rate (Admit) 73 bpm   Heart Rate (Exercise) 105 bpm   Heart Rate (Exit) 80 bpm   Oxygen Saturation (Admit) 100 %   Oxygen Saturation (Exercise) 92 %   Oxygen Saturation (Exit) 98 %   Rating of Perceived Exertion (Exercise) 13   Perceived Dyspnea (Exercise) 1   Duration Progress to 45 minutes of aerobic exercise without signs/symptoms of physical distress   Intensity THRR unchanged   Progression   Progression Continue to progress workloads to maintain intensity without signs/symptoms of physical distress.   Resistance Training   Training Prescription Yes   Weight orange bands   Reps 10-12   Interval Training   Interval Training No   Oxygen   Oxygen Continuous   Liters 3   Treadmill   MPH 2.7  2.7   Grade 1   Minutes  15   Recumbant Bike   Level 3   Minutes 15   NuStep   Level 5   Minutes 15   METs 2.4     Goals Met:  Independence with exercise equipment Using PLB without cueing & demonstrates good technique Exercise tolerated well No report of cardiac concerns or symptoms Strength training completed today  Goals Unmet:  Not Applicable  Comments: Service time is from 1030 to 1215     Dr. Rush Farmer is Medical Director for Pulmonary Rehab at [facility].

## 2015-03-27 ENCOUNTER — Other Ambulatory Visit: Payer: Self-pay | Admitting: Internal Medicine

## 2015-03-27 ENCOUNTER — Encounter (HOSPITAL_COMMUNITY)
Admission: RE | Admit: 2015-03-27 | Discharge: 2015-03-27 | Disposition: A | Discharge status: Home / Self Care | Payer: Medicare Other | Source: Ambulatory Visit | Attending: Pulmonary Disease | Admitting: Pulmonary Disease

## 2015-03-27 DIAGNOSIS — J449 Chronic obstructive pulmonary disease, unspecified: Secondary | ICD-10-CM | POA: Diagnosis not present

## 2015-03-27 NOTE — Progress Notes (Signed)
Daily Session Note  Patient Details  Name: Cathy Hensley MRN: [id number] Date of Birth: 26-Jul-1940 Referring Provider:  Marletta Lor, MD  Encounter Date: 03/27/2015  Check In:     Session Check In - 03/27/15 1101    Check-In   [facility]   Staff Present Rosebud Poles, RN, Luisa Hart, RN, BSN;Ramon Dredge, RN, MHA;Jessica Luan Pulling, MA, ACSM RCEP, Exercise Physiologist;Maria Venetia Maxon, RN, BSN   Supervising physician immediately available to respond to emergencies [facility] immediately available   Physician(s) Dr. Waldron Labs   Medication changes reported     No   Fall or balance concerns reported    No   Warm-up and Cool-down Performed as group-led instruction   Resistance Training Performed Yes   VAD Patient? No   Pain Assessment   Currently in Pain? No/denies   Multiple Pain Sites No      Capillary Blood Glucose: No results found for this or any previous visit (from the past 24 hour(s)).      Exercise Prescription Changes - 03/27/15 1200    Exercise Review   Progression No   Response to Exercise   Blood Pressure (Admit) 121/66 mmHg   Blood Pressure (Exercise) 132/66 mmHg   Blood Pressure (Exit) 116/60 mmHg   Heart Rate (Admit) 71 bpm   Heart Rate (Exercise) 106 bpm   Heart Rate (Exit) 81 bpm   Oxygen Saturation (Admit) 99 %   Oxygen Saturation (Exercise) 94 %   Oxygen Saturation (Exit) 98 %   Rating of Perceived Exertion (Exercise) 11   Perceived Dyspnea (Exercise) 1   Duration Progress to 45 minutes of aerobic exercise without signs/symptoms of physical distress   Intensity THRR unchanged   Progression   Progression Continue to progress workloads to maintain intensity without signs/symptoms of physical distress.   Resistance Training   Training Prescription Yes   Weight orange bands   Reps 10-12   Interval Training   Interval Training No   Oxygen   Oxygen Continuous   Liters 3   Treadmill   MPH 2.7   Grade 1   Minutes 15   NuStep   Level 5   Minutes 15   METs 2.4     Goals Met:  Using PLB without cueing & demonstrates good technique Exercise tolerated well No report of cardiac concerns or symptoms Strength training completed today  Goals Unmet:  Not Applicable  Comments: Service time is from 1030 to 1220    Dr. Rush Farmer is Medical Director for Pulmonary Rehab at [facility].

## 2015-04-01 ENCOUNTER — Encounter (HOSPITAL_COMMUNITY)
Admission: RE | Admit: 2015-04-01 | Discharge: 2015-04-01 | Disposition: A | Discharge status: Home / Self Care | Payer: Medicare Other | Source: Ambulatory Visit | Attending: Pulmonary Disease | Admitting: Pulmonary Disease

## 2015-04-01 VITALS — Wt 116.8 lb

## 2015-04-01 DIAGNOSIS — J449 Chronic obstructive pulmonary disease, unspecified: Secondary | ICD-10-CM

## 2015-04-01 NOTE — Progress Notes (Signed)
Daily Session Note  Patient Details  Name: Cathy Hensley MRN: [id number] Date of Birth: [birth year] Referring Provider:  Marletta Lor, MD  Encounter Date: 04/01/2015  Check In:     Session Check In - 04/01/15 1229    Check-In   [facility]   Staff Present Rosebud Poles, RN, Luisa Hart, RN, BSN;Ramon Dredge, RN, MHA;Jessica Luan Pulling, MA, ACSM RCEP, Exercise Physiologist   Supervising physician immediately available to respond to emergencies [facility] immediately available   Physician(s) Dr. Marily Memos   Medication changes reported     No   Fall or balance concerns reported    No   Warm-up and Cool-down Performed as group-led instruction   Resistance Training Performed Yes   VAD Patient? No   Pain Assessment   Currently in Pain? No/denies   Multiple Pain Sites No      Capillary Blood Glucose: No results found for this or any previous visit (from the past 24 hour(s)).      Exercise Prescription Changes - 04/01/15 1200    Exercise Review   Progression No   Response to Exercise   Blood Pressure (Admit) 108/60 mmHg   Blood Pressure (Exercise) 134/60 mmHg   Blood Pressure (Exit) 111/66 mmHg   Heart Rate (Admit) 73 bpm   Heart Rate (Exercise) 105 bpm   Heart Rate (Exit) 65 bpm   Oxygen Saturation (Admit) 99 %   Oxygen Saturation (Exercise) 9895 %   Oxygen Saturation (Exit) 99 %   Rating of Perceived Exertion (Exercise) 11   Perceived Dyspnea (Exercise) 1   Symptoms none   Comments none   Duration Progress to 45 minutes of aerobic exercise without signs/symptoms of physical distress   Intensity THRR unchanged   Progression   Progression Continue to progress workloads to maintain intensity without signs/symptoms of physical distress.   Resistance Training   Training Prescription Yes   Weight orange bands   Reps 10-12   Interval Training   Interval Training No   Oxygen   Oxygen Continuous   Liters 3   Treadmill   MPH 2.7   Grade 1   Minutes 15   Recumbant Bike   Level 3   Minutes 15   NuStep   Level 5   Minutes 15   METs 2.4     Goals Met:  Proper associated with RPD/PD & O2 Sat Independence with exercise equipment Improved SOB with ADL's Exercise tolerated well Strength training completed today  Goals Unmet:  Not Applicable  Comments: Service time is from 1030 to 1200    Dr. Rush Farmer is Medical Director for Pulmonary Rehab at [facility].

## 2015-04-03 ENCOUNTER — Encounter (HOSPITAL_COMMUNITY)
Admission: RE | Admit: 2015-04-03 | Discharge: 2015-04-03 | Disposition: A | Discharge status: Home / Self Care | Payer: Medicare Other | Source: Ambulatory Visit | Attending: Pulmonary Disease | Admitting: Pulmonary Disease

## 2015-04-03 VITALS — Wt 116.8 lb

## 2015-04-03 DIAGNOSIS — J449 Chronic obstructive pulmonary disease, unspecified: Secondary | ICD-10-CM

## 2015-04-03 NOTE — Progress Notes (Signed)
Daily Session Note  Patient Details  Name: Cathy Hensley MRN: [id number] Date of Birth: 03-06-1940 Referring Provider:  Marletta Lor, MD  Encounter Date: 04/03/2015  Check In:     Session Check In - 04/03/15 1243    Check-In   [facility]   Staff Present Rosebud Poles, RN, BSN;Molly diVincenzo, MS, ACSM RCEP, Exercise Physiologist;Cederic Mozley Ysidro Evert, RN;Portia Rollene Rotunda, RN, BSN   Supervising physician immediately available to respond to emergencies [facility] immediately available   Physician(s) Dr. Marily Memos   Medication changes reported     No   Fall or balance concerns reported    No   Warm-up and Cool-down Performed as group-led instruction   Resistance Training Performed Yes   VAD Patient? No   Pain Assessment   Currently in Pain? No/denies   Multiple Pain Sites No      Capillary Blood Glucose: No results found for this or any previous visit (from the past 24 hour(s)).      Exercise Prescription Changes - 04/03/15 1200    Exercise Review   Progression No   Response to Exercise   Blood Pressure (Admit) 128/62 mmHg   Blood Pressure (Exercise) 120/70 mmHg   Blood Pressure (Exit) 100/70 mmHg   Heart Rate (Admit) 74 bpm   Heart Rate (Exercise) 105 bpm   Heart Rate (Exit) 93 bpm   Oxygen Saturation (Admit) 100 %   Oxygen Saturation (Exercise) 94 %   Oxygen Saturation (Exit) 99 %   Rating of Perceived Exertion (Exercise) 11   Perceived Dyspnea (Exercise) 1   Duration Progress to 45 minutes of aerobic exercise without signs/symptoms of physical distress   Intensity THRR unchanged   Progression   Progression Continue to progress workloads to maintain intensity without signs/symptoms of physical distress.   Resistance Training   Training Prescription Yes   Weight orange bands   Reps 10-12   Interval Training   Interval Training No   Oxygen   Oxygen Continuous   Liters 3   Treadmill   MPH 2.7   Grade 1   Minutes 15   Recumbant  Bike   Level 3   Minutes 15     Goals Met:  Independence with exercise equipment Using PLB without cueing & demonstrates good technique Exercise tolerated well No report of cardiac concerns or symptoms Strength training completed today  Goals Unmet:  Not Applicable  Comments: Service time is from 1030 to 1220    Dr. Rush Farmer is Medical Director for Pulmonary Rehab at [facility].

## 2015-04-08 ENCOUNTER — Telehealth: Payer: Self-pay | Admitting: *Deleted

## 2015-04-08 ENCOUNTER — Encounter: Payer: Self-pay | Admitting: Internal Medicine

## 2015-04-08 ENCOUNTER — Encounter (HOSPITAL_COMMUNITY)
Admission: RE | Admit: 2015-04-08 | Discharge: 2015-04-08 | Disposition: A | Discharge status: Home / Self Care | Payer: Medicare Other | Source: Ambulatory Visit | Attending: Pulmonary Disease | Admitting: Pulmonary Disease

## 2015-04-08 VITALS — Wt 117.1 lb

## 2015-04-08 DIAGNOSIS — J449 Chronic obstructive pulmonary disease, unspecified: Secondary | ICD-10-CM | POA: Insufficient documentation

## 2015-04-08 DIAGNOSIS — K921 Melena: Secondary | ICD-10-CM

## 2015-04-08 NOTE — Telephone Encounter (Signed)
Spoke to pt, told her I  was calling about her Cologuard test came back positive for blood in stool. Pt verbalized understanding and said she just received result in mail. Told her Dr.K wants her to see GI to evaluate. Told her someone will contact you to set up an appt. Pt verbalized understanding.

## 2015-04-08 NOTE — Progress Notes (Signed)
Daily Session Note  Patient Details  Name: Cathy Hensley MRN: [id number] Date of Birth: [birth year] Referring Provider:  Marletta Lor, MD  Encounter Date: 04/08/2015  Check In:     Session Check In - 04/08/15 1000    Check-In   Location [facility]   Staff Present Ramon Dredge, RN, MHA;Jessica Luan Pulling, MA, ACSM RCEP, Exercise Physiologist;Portia Rollene Rotunda, RN, Roque Cash, RN   Supervising physician immediately available to respond to emergencies [facility] immediately available   Physician(s) Dr. Marily Memos   Medication changes reported     No   Fall or balance concerns reported    No   Warm-up and Cool-down Performed as group-led instruction   Resistance Training Performed Yes   VAD Patient? No   Pain Assessment   Currently in Pain? No/denies   Multiple Pain Sites No      Capillary Blood Glucose: No results found for this or any previous visit (from the past 24 hour(s)).      Exercise Prescription Changes - 04/08/15 1200    Exercise Review   Progression No   Response to Exercise   Blood Pressure (Admit) 102/60 mmHg   Blood Pressure (Exercise) 138/64 mmHg   Blood Pressure (Exit) 120/60 mmHg   Heart Rate (Admit) 80 bpm   Heart Rate (Exercise) 101 bpm   Heart Rate (Exit) 89 bpm   Oxygen Saturation (Admit) 100 %   Oxygen Saturation (Exercise) 91 %   Oxygen Saturation (Exit) 96 %   Rating of Perceived Exertion (Exercise) 13   Perceived Dyspnea (Exercise) 1   Duration Progress to 45 minutes of aerobic exercise without signs/symptoms of physical distress   Intensity THRR unchanged   Progression   Progression Continue to progress workloads to maintain intensity without signs/symptoms of physical distress.   Resistance Training   Training Prescription Yes   Weight orange bands   Reps 10-12   Interval Training   Interval Training No   Oxygen   Oxygen Continuous   Liters 3   Treadmill   MPH 2.7   Grade 1   Minutes 15   Recumbant Bike   Level 3   Minutes 15   NuStep   Level 5   Minutes 15   METs 2.8     Goals Met:  Using PLB without cueing & demonstrates good technique Exercise tolerated well No report of cardiac concerns or symptoms Strength training completed today  Goals Unmet:  Not Applicable  Comments: Service time is from 1030 to 1210    Dr. Rush Farmer is Medical Director for Pulmonary Rehab at [facility].

## 2015-04-08 NOTE — Telephone Encounter (Signed)
Left message on voicemail to call office.  

## 2015-04-10 ENCOUNTER — Encounter (HOSPITAL_COMMUNITY)
Admission: RE | Admit: 2015-04-10 | Discharge: 2015-04-10 | Disposition: A | Discharge status: Home / Self Care | Payer: Medicare Other | Source: Ambulatory Visit | Attending: Pulmonary Disease | Admitting: Pulmonary Disease

## 2015-04-10 DIAGNOSIS — J449 Chronic obstructive pulmonary disease, unspecified: Secondary | ICD-10-CM | POA: Diagnosis not present

## 2015-04-11 ENCOUNTER — Encounter: Payer: Self-pay | Admitting: Gastroenterology

## 2015-04-14 NOTE — Progress Notes (Signed)
Pulmonary Rehab Discharge Note: Cathy Hensley has been discharged from pulmonary rehab after successfully completing 24 exercise/education sessions. Cathy Hensley feels she definitely increased her stamina and strength while in the program although she did not increase the footage she was about to walk on her discharge walk test. She does state that she feels she has had maximum benefit from the program. She states she fully understands how to manage her disease process better including how to manage sick days and the importance of good nutrition, exercise, and PLB. She wants to walk on the beach this [place] and feel she will be able to do so. She plans to continue to exercise at discharge at [facility] [employer]. She has already joined and had a "walk thru" this past week. She is very excited to continue her 3 times/week exercise regimen. It was a pleasure having Cathy Hensley in pulmonary rehab.

## 2015-04-15 ENCOUNTER — Encounter (HOSPITAL_COMMUNITY): Admission: RE | Admit: 2015-04-15 | Payer: Medicare Other | Source: Ambulatory Visit

## 2015-04-17 ENCOUNTER — Encounter (HOSPITAL_COMMUNITY): Payer: Medicare Other

## 2015-04-22 ENCOUNTER — Encounter (HOSPITAL_COMMUNITY): Payer: Medicare Other

## 2015-04-24 ENCOUNTER — Encounter (HOSPITAL_COMMUNITY): Payer: Medicare Other

## 2015-04-28 DIAGNOSIS — J449 Chronic obstructive pulmonary disease, unspecified: Secondary | ICD-10-CM | POA: Diagnosis not present

## 2015-04-29 ENCOUNTER — Encounter (HOSPITAL_COMMUNITY): Payer: Medicare Other

## 2015-05-01 ENCOUNTER — Encounter (HOSPITAL_COMMUNITY): Payer: Medicare Other

## 2015-05-06 ENCOUNTER — Encounter (HOSPITAL_COMMUNITY): Payer: Medicare Other

## 2015-05-08 ENCOUNTER — Encounter (HOSPITAL_COMMUNITY): Payer: Medicare Other

## 2015-05-10 DIAGNOSIS — J449 Chronic obstructive pulmonary disease, unspecified: Secondary | ICD-10-CM | POA: Diagnosis not present

## 2015-05-13 ENCOUNTER — Encounter (HOSPITAL_COMMUNITY): Payer: Medicare Other

## 2015-05-15 ENCOUNTER — Encounter (HOSPITAL_COMMUNITY): Payer: Medicare Other

## 2015-05-20 ENCOUNTER — Encounter (HOSPITAL_COMMUNITY): Admission: RE | Admit: 2015-05-20 | Payer: Medicare Other | Source: Ambulatory Visit

## 2015-05-27 DIAGNOSIS — J449 Chronic obstructive pulmonary disease, unspecified: Secondary | ICD-10-CM | POA: Diagnosis not present

## 2015-06-09 DIAGNOSIS — J449 Chronic obstructive pulmonary disease, unspecified: Secondary | ICD-10-CM | POA: Diagnosis not present

## 2015-06-10 DIAGNOSIS — J449 Chronic obstructive pulmonary disease, unspecified: Secondary | ICD-10-CM | POA: Diagnosis not present

## 2015-06-13 ENCOUNTER — Encounter: Payer: Self-pay | Admitting: Gastroenterology

## 2015-06-13 ENCOUNTER — Ambulatory Visit (INDEPENDENT_AMBULATORY_CARE_PROVIDER_SITE_OTHER): Payer: Medicare Other | Admitting: Gastroenterology

## 2015-06-13 VITALS — BP 128/56 | HR 72 | Ht 64.0 in | Wt 115.2 lb

## 2015-06-13 DIAGNOSIS — K921 Melena: Secondary | ICD-10-CM

## 2015-06-13 NOTE — Patient Instructions (Signed)
Please discuss further testing for heme + stools with Dr. Kirtland Bouchard next week and contact Dr. Christella Hartigan. Options include colonsocopy now, CT scan abd/pelvis, or no further testing.

## 2015-06-13 NOTE — Progress Notes (Signed)
HPI: This is a very pleasant 75 yo woman whom I am meeting for the first time tody.  Chief complaint is heme positive stool.  She has intermittent blood on tissue.  For past 6 months.  Had blood in toilette several months ago around time of severe constipation, straining.  No FH of colon cancer.  She did a "BIO IQ" test, FOB was positive.  This test was mailed to her by her health insurance company.  She has 'severe COPD' per her pulmonologist.  Desated to 70% in pulm office on [place] trial in [place] office 4 months ago.  She is on 3 liters [place], chronically.    Past Medical History  Diagnosis Date  . COPD 07/13/2006  . HYPERTENSION 07/13/2006  . MENOPAUSAL SYNDROME 07/13/2006  . Other and unspecified hyperlipidemia 07/13/2006  . Respiratory failure (HCC)     Chronic hypoxic  . Alpha-1-antitrypsin deficiency carrier [facility])     Past Surgical History  Procedure Laterality Date  . Tonsillectomy      Current Outpatient Prescriptions  Medication Sig Dispense Refill  . acetaminophen (TYLENOL) 500 MG tablet Take 500 mg by mouth every 6 (six) hours as needed.    Marland Kitchen amLODipine (NORVASC) 5 MG tablet Take 1 tablet (5 mg total) by mouth daily. 90 tablet 3  . arformoterol (BROVANA) 15 MCG/2ML NEBU Take 2 mLs (15 mcg total) by nebulization 2 (two) times daily. 120 mL 3  . aspirin 81 MG chewable tablet Chew 1 tablet (81 mg total) by mouth daily.    . budesonide (PULMICORT) 0.5 MG/2ML nebulizer solution Take 2 mLs (0.5 mg total) by nebulization 2 (two) times daily. 60 mL 12  . furosemide (LASIX) 20 MG tablet 1 tablet daily as needed for fluid retention 30 tablet 3  . levalbuterol (XOPENEX HFA) 45 MCG/ACT inhaler Inhale 2 puffs into the lungs every 4 (four) hours as needed for wheezing. 1 Inhaler 3  . montelukast (SINGULAIR) 10 MG tablet TAKE 1 TABLET BY MOUTH AT BEDTIME 30 tablet 5  . Multiple Vitamins-Minerals (CENTRUM SILVER PO) Take 1 tablet by mouth daily.    . simvastatin (ZOCOR) 20 MG  tablet Take 1 tablet (20 mg total) by mouth at bedtime. 90 tablet 3  . tiotropium (SPIRIVA) 18 MCG inhalation capsule Place 1 capsule (18 mcg total) into inhaler and inhale daily. 30 capsule 12   No current facility-administered medications for this visit.    Allergies as of 06/13/2015  . (No Known Allergies)    Family History  Problem Relation Age of Onset  . AAA (abdominal aortic aneurysm) Mother   . Emphysema Mother   . Aneurysm Sister     brain  . Emphysema Father   . Lymphoma Sister     Social History   Social History  . Marital Status: Married    Spouse Name: N/A  . Number of Children: 2  . Years of Education: N/A   Occupational History  . retired    Social History Main Topics  . Smoking status: Former Smoker -- 0.50 packs/day for 42 years    Types: Cigarettes    Quit date: 09/23/2000  . Smokeless tobacco: Never Used  . Alcohol Use: 0.6 oz/week    1 Standard drinks or equivalent per week     Comment: 1 glass of wine nearly daily.  . Drug Use: No  . Sexual Activity: Not on file   Other Topics Concern  . Not on file   Social History Narrative  Lives at home with her husband. Retired.      [facility]:   She is originally from [place]. She has 2 daughters, one lives in [place] other in [place]. 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 She has no pets currently. No previous bird, mold, or hot tub exposure.      Physical Exam: BP 128/56 mmHg  Pulse 72  Ht 5' 4" (1.626 m)  Wt 115 lb 4 oz (52.277 kg)  BMI 19.77 kg/m2 Constitutional: generally well-appearing Psychiatric: alert and oriented x3 Abdomen: soft, nontender, nondistended, no obvious ascites, no peritoneal signs, normal bowel sounds Rectal examination: small external hemorrhoids, no distal rectal mass (with female assistant in the room)  Assessment and plan: 75 y.o. female with severe copd, heme positive stool  Her  insurance company mailed her home stool colon cancer screening kit and it was positive.  We had a very nice discussion with her husband in the room about colon cancer screening.  She understands colon cancer screening generally ends at age 80.  I admitted that with her 'severe copd' (her pulmonologists words) I would not have asked her to undergo screening at age 27 like her insurance company did.  Her minor ano rectal bleeding is likely benign, rectal exam underwelming today.  I explained that we can proceed with colonoscopy (risky with her severe copd), ignore the FOBT positive result, or do other test.  She wants to discuss this with her PCP on Monday and I'm interestered to hear the outcome of that discussion.  I offered CT scan abd/pelvis as a reasonable alternative; this should pick up large, signficant lesions in the colon usually.  If the colon appears normal or relatively normal on the CT scan then we could simply follow her clinically.  She desats to 70% on RA with minor ambulation and in my opinion should only undergo invasive testing, sedation in urgent or emergent situation.  Owens Loffler, MD [facility] 06/13/2015, 11:09 AM

## 2015-06-16 ENCOUNTER — Encounter: Payer: Self-pay | Admitting: Internal Medicine

## 2015-06-16 ENCOUNTER — Ambulatory Visit (INDEPENDENT_AMBULATORY_CARE_PROVIDER_SITE_OTHER): Payer: Medicare Other | Admitting: Internal Medicine

## 2015-06-16 VITALS — BP 122/72 | HR 83 | Temp 97.9°F | Ht 64.0 in | Wt 116.0 lb

## 2015-06-16 DIAGNOSIS — I10 Essential (primary) hypertension: Secondary | ICD-10-CM | POA: Diagnosis not present

## 2015-06-16 DIAGNOSIS — J9611 Chronic respiratory failure with hypoxia: Secondary | ICD-10-CM

## 2015-06-16 NOTE — Patient Instructions (Signed)
Limit your sodium (Salt) intake  Return in 6 months for follow-up  

## 2015-06-16 NOTE — Progress Notes (Signed)
Subjective:    Patient ID: Cathy Hensley, female    DOB: 05-11-1940, 75 y.o.   MRN: [id number]  HPI   75 year old patient who is followed closely by pulmonary medication with severe oxygen dependent COPD.  She has essential hypertension which has been stable.   She has recently completed pulmonary rehabilitation, which was quite beneficial.   Her pulmonary status has been stable.    She remains surprisingly functional-.  She states that she exercises at her local health club on a treadmill at least 3 times per week; .  She walks on a treadmill for 15 minutes at a rate of 2.7 miles per hour at a slight incline and does quite well.    She has been referred to GI after a positive FOB  via her health insurance company and  subsequent Cologuard testing was performed and was POSITIVE.   She has a long history of scanty bright red bleeding on tissue paper and occasional bleeding after difficult constipated bowel movements.  No abdominal pain or other obstructive symptoms.  History of anemia or change in her bowel habits.    It is unclear whether GI was aware of the positive Cologuard  Test.  Past Medical History  Diagnosis Date  . COPD 07/13/2006  . HYPERTENSION 07/13/2006  . MENOPAUSAL SYNDROME 07/13/2006  . Other and unspecified hyperlipidemia 07/13/2006  . Respiratory failure (HCC)     Chronic hypoxic  . Alpha-1-antitrypsin deficiency carrier [facility])      Social History   Social History  . Marital Status: Married    Spouse Name: N/A  . Number of Children: 2  . Years of Education: N/A   Occupational History  . retired    Social History Main Topics  . Smoking status: Former Smoker -- 0.50 packs/day for 42 years    Types: Cigarettes    Quit date: 09/23/2000  . Smokeless tobacco: Never Used  . Alcohol Use: 0.6 oz/week    1 Standard drinks or equivalent per week     Comment: 1 glass of wine nearly daily.  . Drug Use: No  . Sexual Activity: Not on file   Other Topics Concern  . Not on  file   Social History Narrative   Lives at home with her husband. Retired.      [place] [facility]:   She is originally from [place]. She has 2 daughters, one lives in [place] & the other in [place]. Previously worked Clinical cytogeneticist a Musician, ran a Lawyer, clerical work, Lawyer. She has no pets currently. No previous bird, mold, or hot tub exposure.     Past Surgical History  Procedure Laterality Date  . Tonsillectomy      Family History  Problem Relation Age of Onset  . AAA (abdominal aortic aneurysm) Mother   . Emphysema Mother   . Aneurysm Sister     brain  . Emphysema Father   . Lymphoma Sister     No Known Allergies  Current Outpatient Prescriptions on File Prior to Visit  Medication Sig Dispense Refill  . acetaminophen (TYLENOL) 500 MG tablet Take 500 mg by mouth every 6 (six) hours as needed.    Marland Kitchen amLODipine (NORVASC) 5 MG tablet Take 1 tablet (5 mg total) by mouth daily. 90 tablet 3  . arformoterol (BROVANA) 15 MCG/2ML NEBU Take 2 mLs (15 mcg total) by nebulization 2 (two) times daily. 120 mL 3  . aspirin 81 MG chewable tablet Chew 1 tablet (81 mg  total) by mouth daily.    . budesonide (PULMICORT) 0.5 MG/2ML nebulizer solution Take 2 mLs (0.5 mg total) by nebulization 2 (two) times daily. 60 mL 12  . furosemide (LASIX) 20 MG tablet 1 tablet daily as needed for fluid retention 30 tablet 3  . levalbuterol (XOPENEX HFA) 45 MCG/ACT inhaler Inhale 2 puffs into the lungs every 4 (four) hours as needed for wheezing. 1 Inhaler 3  . montelukast (SINGULAIR) 10 MG tablet TAKE 1 TABLET BY MOUTH AT BEDTIME 30 tablet 5  . Multiple Vitamins-Minerals (CENTRUM SILVER PO) Take 1 tablet by mouth daily.    . simvastatin (ZOCOR) 20 MG tablet Take 1 tablet (20 mg total) by mouth at bedtime. 90 tablet 3  . tiotropium (SPIRIVA) 18 MCG inhalation capsule Place 1 capsule (18 mcg total) into inhaler and inhale daily. 30 capsule 12   No current facility-administered  medications on file prior to visit.    BP 122/72 mmHg  Pulse 83  Temp(Src) 97.9 F (36.6 C) (Oral)  Ht 5\' 4"  (1.626 m)  Wt 116 lb (52.617 kg)  BMI 19.90 kg/m2  SpO2 92%     Review of Systems  Constitutional: Negative.   HENT: Negative for congestion, dental problem, hearing loss, rhinorrhea, sinus pressure, sore throat and tinnitus.   Eyes: Negative for pain, discharge and visual disturbance.  Respiratory: Negative for cough and shortness of breath.   Cardiovascular: Positive for leg swelling. Negative for chest pain and palpitations.  Gastrointestinal: Negative for nausea, vomiting, abdominal pain, diarrhea, constipation, blood in stool and abdominal distention.  Genitourinary: Negative for dysuria, urgency, frequency, hematuria, flank pain, vaginal bleeding, vaginal discharge, difficulty urinating, vaginal pain and pelvic pain.  Musculoskeletal: Negative for joint swelling, arthralgias and gait problem.  Skin: Negative for rash.  Neurological: Negative for dizziness, syncope, speech difficulty, weakness, numbness and headaches.  Hematological: Negative for adenopathy.  Psychiatric/Behavioral: Negative for behavioral problems, dysphoric mood and agitation. The patient is not nervous/anxious.        Objective:   Physical Exam  Constitutional: She is oriented to person, place, and time. She appears well-developed and well-nourished.  Frail Chronic nasal O2 in place No distress  HENT:  Head: Normocephalic.  Right Ear: External ear normal.  Left Ear: External ear normal.  Mouth/Throat: Oropharynx is clear and moist.  Eyes: Conjunctivae and EOM are normal. Pupils are equal, round, and reactive to light.  Neck: Normal range of motion. Neck supple. No thyromegaly present.  Cardiovascular: Normal rate, regular rhythm, normal heart sounds and intact distal pulses.   Pulmonary/Chest: Effort normal.  Diffusely diminished breath sounds but clear  Abdominal: Soft. Bowel sounds are  normal. She exhibits no mass. There is no tenderness.  Musculoskeletal: Normal range of motion. She exhibits no edema.  Lymphadenopathy:    She has no cervical adenopathy.  Neurological: She is alert and oriented to person, place, and time.  Skin: Skin is warm and dry. No rash noted.  Psychiatric: She has a normal mood and affect. Her behavior is normal.          Assessment & Plan:   Positive  Cologuard test  in  patient with advanced  COPD. We'll discuss with GI-  Unclear whether  GI  aware that Cologuard  Test, positive-  May have assumed only positive FOB. Will also discuss with pulmonary medicine.   If patient not a surgical candidate for a possible curative partial colectomy or her prognosis is so limited, then  certainly no further evaluation will  be considered.

## 2015-06-16 NOTE — Progress Notes (Signed)
Pre visit review using our clinic review tool, if applicable. No additional management support is needed unless otherwise documented below in the visit note. 

## 2015-06-18 ENCOUNTER — Encounter: Payer: Self-pay | Admitting: Internal Medicine

## 2015-06-23 DIAGNOSIS — J449 Chronic obstructive pulmonary disease, unspecified: Secondary | ICD-10-CM | POA: Diagnosis not present

## 2015-07-09 DIAGNOSIS — J449 Chronic obstructive pulmonary disease, unspecified: Secondary | ICD-10-CM | POA: Diagnosis not present

## 2015-07-10 DIAGNOSIS — J449 Chronic obstructive pulmonary disease, unspecified: Secondary | ICD-10-CM | POA: Diagnosis not present

## 2015-07-23 DIAGNOSIS — J449 Chronic obstructive pulmonary disease, unspecified: Secondary | ICD-10-CM | POA: Diagnosis not present

## 2015-07-29 ENCOUNTER — Ambulatory Visit: Payer: Self-pay | Admitting: Pulmonary Disease

## 2015-08-04 ENCOUNTER — Ambulatory Visit (INDEPENDENT_AMBULATORY_CARE_PROVIDER_SITE_OTHER): Payer: Medicare Other | Admitting: Pulmonary Disease

## 2015-08-04 ENCOUNTER — Encounter: Payer: Self-pay | Admitting: Pulmonary Disease

## 2015-08-04 VITALS — BP 132/66 | HR 74 | Ht 64.0 in | Wt 115.0 lb

## 2015-08-04 DIAGNOSIS — J9612 Chronic respiratory failure with hypercapnia: Secondary | ICD-10-CM

## 2015-08-04 DIAGNOSIS — J309 Allergic rhinitis, unspecified: Secondary | ICD-10-CM

## 2015-08-04 DIAGNOSIS — J449 Chronic obstructive pulmonary disease, unspecified: Secondary | ICD-10-CM

## 2015-08-04 DIAGNOSIS — J9611 Chronic respiratory failure with hypoxia: Secondary | ICD-10-CM | POA: Diagnosis not present

## 2015-08-04 NOTE — Progress Notes (Signed)
\\  Subjective:    Patient ID: Cathy Hensley, female    DOB: Jul 20, 1940, 75 y.o.   MRN: [id number]  C.C.:  Follow-up for Very Severe COPD, Allergic Rhinitis, Chronic Hypoxic Respiratory Failure, & Chronic Hypercarbic Respiratory Failure  HPI Very Severe COPD:  Known Alpha-1 antitrypsin heterozygote (MZ). On Brovana & Budesonide nebs twice daily along with Spiriva. Patient has completed pulmonary rehabilitation and is currently exercising at a local gym as well as home. She reports she feels like her endurance has improved significantly. No exacerbations since last appointment. She reports she is rarely using her Xopenex inhaler. Reports minimal coughing & wheezing.   Allergic Rhinitis:  Prescribed Singulair. No sinus congestion, pressure, or drainage.  Chronic Hypoxic Respiratory Failure: Prescribed oxygen 3 L/m pulse dose to maintain saturation with ambulation. Patient has been increasing to 4 L/m with exertion on pulse when she's exerting herself.   Chronic Hypercarbic Respiratory Failure: Secondary to COPD.   Review of Systems No chest pain or pressure. No fever, chills, or sweats. No nausea, emesis, or reflux.   No Known Allergies  Current Outpatient Prescriptions on File Prior to Visit  Medication Sig Dispense Refill  . acetaminophen (TYLENOL) 500 MG tablet Take 500 mg by mouth every 6 (six) hours as needed.    Marland Kitchen amLODipine (NORVASC) 5 MG tablet Take 1 tablet (5 mg total) by mouth daily. 90 tablet 3  . arformoterol (BROVANA) 15 MCG/2ML NEBU Take 2 mLs (15 mcg total) by nebulization 2 (two) times daily. 120 mL 3  . aspirin 81 MG chewable tablet Chew 1 tablet (81 mg total) by mouth daily.    . budesonide (PULMICORT) 0.5 MG/2ML nebulizer solution Take 2 mLs (0.5 mg total) by nebulization 2 (two) times daily. 60 mL 12  . furosemide (LASIX) 20 MG tablet 1 tablet daily as needed for fluid retention 30 tablet 3  . levalbuterol (XOPENEX HFA) 45 MCG/ACT inhaler Inhale 2 puffs into the lungs  every 4 (four) hours as needed for wheezing. 1 Inhaler 3  . montelukast (SINGULAIR) 10 MG tablet TAKE 1 TABLET BY MOUTH AT BEDTIME 30 tablet 5  . Multiple Vitamins-Minerals (CENTRUM SILVER PO) Take 1 tablet by mouth daily.    . simvastatin (ZOCOR) 20 MG tablet Take 1 tablet (20 mg total) by mouth at bedtime. 90 tablet 3  . tiotropium (SPIRIVA) 18 MCG inhalation capsule Place 1 capsule (18 mcg total) into inhaler and inhale daily. 30 capsule 12   No current facility-administered medications on file prior to visit.     Past Medical History:  Diagnosis Date  . 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 COPD 07/13/2006  . HYPERTENSION 07/13/2006  . MENOPAUSAL SYNDROME 07/13/2006  . Other and unspecified hyperlipidemia 07/13/2006  . Respiratory failure (HCC)    Chronic hypoxic    Past Surgical History:  Procedure Laterality Date  . TONSILLECTOMY      Family History  Problem Relation Age of Onset  . AAA (abdominal aortic aneurysm) Mother   . Emphysema Mother   . Aneurysm Sister     brain  . Emphysema Father   . Lymphoma Sister     Social History   Social History  . Marital status: Married    Spouse name: N/A  . Number of children: 2  . Years of education: N/A   Occupational History  . retired    Social History Main Topics  . Smoking status: Former Smoker    Packs/day: 0.50    Years: 42.00  Types: Cigarettes    Quit date: 09/23/2000  . Smokeless tobacco: Never Used  . Alcohol use 0.6 oz/week    1 Standard drinks or equivalent per week     Comment: 1 glass of wine nearly daily.  . Drug use: No  . Sexual activity: Not Asked   Other Topics Concern  . None   Social History Narrative   Lives at home with her husband. Retired.      [place] [facility]:   She is originally from [place]. She has 2 daughters, one lives in [place] & the other in [place]. Previously worked Clinical cytogeneticist a Musician, ran a Lawyer, clerical work, Lawyer. She has no  pets currently. No previous bird, mold, or hot tub exposure.       Objective:   Physical Exam BP 132/66 (BP Location: Left Arm, Cuff Size: Normal)   Pulse 74   Ht 5\' 4"  (1.626 m)   Wt 115 lb (52.2 kg)   SpO2 91%   BMI 19.74 kg/m  General:  Appears well. No distress.   Integument:  Warm & dry. No rash on exposed skin.  HEENT:  Moist mucus membranes. No scleral icterus. No oral ulcers. Cardiovascular:  Regular rate. No appreciable JVD or edema. Normal S1 & S2.  Pulmonary:  Symmetrically decreased breath sounds. No accessory muscle use on supplemental oxygen. Overall clear to auscultation. Abdomen: Soft. Normal bowel sounds. Nondistended.  Musculoskeletal:  Normal bulk and tone. No joint deformity or effusion appreciated.  PFT 01/22/15: FVC 1.58 L (53%) FEV1 0.58 L (26%) FEV1/FVC 0.37 FEF 25-75 0.23 L (13%) no bronchodilator response 11/05/14:FVC 1.52 L (51%) FEV1 0.49 L (22%) FEV1/FVC 0.32 FEF 25-75 0.20 L (11%) no bronchodilator response ERV 87% DLCO uncorrected 27%  01/22/15:  Walked 192 meters / Baseline Sat 93% on RA / Nadir Sat 70% on RA (maintained saturation with 3 L/m pulse at 90%)  IMAGING Portable CXR 09/03/14 (previously reviewed by me): Hyperinflation with flattening of the diaphragms. No focal opacity or mass appreciated. No pleural effusion or thickening appreciated. Heart normal in size. Mediastinum normal in contour.  CARDIAC TTE (09/04/14): LV normal in size. Moderate LVH. EF 60-65%. No regional wall motion abnormalities. LA & RA normal in size. RV normal in size and function. Pulmonary artery systolic pressure 30 mmHg. No aortic stenosis or regurgitation. Normal aortic root. Mild mitral regurgitation. No pulmonic regurgitation. Trivial tricuspid regurgitation. Trivial pericardial effusion.  MICROBIOLOGY Sputum (09/06/14): Oral flora Urine Strep Ag (09/06/14): Negative Urine Legionella Ag (09/06/14): Negative Influenza A/B PCR (09/06/14): Negative    LABS 11/06/14 Alpha-1 antitrypsin: MZ (88)  09/06/14 ABG on 3 L/m: 7.40 / 65 / 63 / 91% CBC: 11.5/14.2/46.3/240 BMP: 140/4.4/99/41/19/0.38/118/9.2 LFT: 3.7/6.3/0.5/61/20/21    Assessment & Plan:  75 year old female with very severe COPD and chronic respiratory failure from hypoxia and hypercapnia. Symptomatically the patient has improved significantly in terms of endurance after pulmonary rehabilitation. I encouraged the patient to continue her exercise regimen. He also discussed the risks of hyperoxygenation and further titration of her oxygen therapy. The patient will continue on her current nebulizer and inhaler regimen given excellent symptomatic control. Her allergic rhinitis remains well controlled with Singulair alone. I congratulated the patient on her running absence of an exacerbation for some time now and encouraged her to continue enjoying life to its fullest. I instructed the patient to contact my office if she had any new breathing problems or questions before her next appointment.   1. Very severe  COPD: Continuing Brovana, budesonide, and Spiriva without change. 2. Chronic Hypoxic Respiratory Failure: Continuing on oxygen as previously prescribed with exertion. No changes. 3. Chronic Hypercarbic Respiratory Failure: Patient educated on potential risk of worsening hypercarbia with hyper oxygenation. Holding off on noninvasive positive pressure ventilation at this time. 4. Allergic Rhinitis: Well controlled with Singulair. No changes. 5. Health Maintenance: Status post Pneumovax March 2012, Prevnar January 2015, & Tdap June 2012. Recommended influenza vaccine in September 2017. 6. Follow-up: Return to clinic in 6 months or sooner if needed.   Donna Christen Jamison Neighbor, M.D. [facility] Pager:  [phone] After 3pm or if no response, call [phone] 2:27 PM 08/04/15

## 2015-08-04 NOTE — Patient Instructions (Signed)
   Continue using your oxygen as prescribed with exertion.  Continue using your nebulizer and inhaler medications as prescribed.  Call me if you have any new breathing problems before your next appointment in 6 months.

## 2015-08-10 DIAGNOSIS — J449 Chronic obstructive pulmonary disease, unspecified: Secondary | ICD-10-CM | POA: Diagnosis not present

## 2015-08-11 DIAGNOSIS — J449 Chronic obstructive pulmonary disease, unspecified: Secondary | ICD-10-CM | POA: Diagnosis not present

## 2015-08-22 DIAGNOSIS — J449 Chronic obstructive pulmonary disease, unspecified: Secondary | ICD-10-CM | POA: Diagnosis not present

## 2015-09-10 DIAGNOSIS — J449 Chronic obstructive pulmonary disease, unspecified: Secondary | ICD-10-CM | POA: Diagnosis not present

## 2015-09-17 ENCOUNTER — Other Ambulatory Visit: Payer: Self-pay | Admitting: Internal Medicine

## 2015-09-23 DIAGNOSIS — J449 Chronic obstructive pulmonary disease, unspecified: Secondary | ICD-10-CM | POA: Diagnosis not present

## 2015-10-01 ENCOUNTER — Other Ambulatory Visit: Payer: Self-pay | Admitting: Internal Medicine

## 2015-10-01 ENCOUNTER — Other Ambulatory Visit: Payer: Self-pay | Admitting: Pulmonary Disease

## 2015-10-02 ENCOUNTER — Other Ambulatory Visit: Payer: Self-pay | Admitting: Pulmonary Disease

## 2015-10-07 DIAGNOSIS — J449 Chronic obstructive pulmonary disease, unspecified: Secondary | ICD-10-CM | POA: Diagnosis not present

## 2015-10-10 DIAGNOSIS — J449 Chronic obstructive pulmonary disease, unspecified: Secondary | ICD-10-CM | POA: Diagnosis not present

## 2015-10-22 DIAGNOSIS — J449 Chronic obstructive pulmonary disease, unspecified: Secondary | ICD-10-CM | POA: Diagnosis not present

## 2015-10-27 ENCOUNTER — Encounter: Payer: Self-pay | Admitting: Adult Health

## 2015-10-27 ENCOUNTER — Ambulatory Visit (INDEPENDENT_AMBULATORY_CARE_PROVIDER_SITE_OTHER): Payer: Medicare Other | Admitting: Adult Health

## 2015-10-27 DIAGNOSIS — J449 Chronic obstructive pulmonary disease, unspecified: Secondary | ICD-10-CM | POA: Diagnosis not present

## 2015-10-27 MED ORDER — AMOXICILLIN-POT CLAVULANATE 875-125 MG PO TABS: 1.0000 | ORAL_TABLET | Freq: Two times a day (BID) | ORAL | 0 refills | Status: AC

## 2015-10-27 NOTE — Progress Notes (Signed)
Subjective:    Patient ID: Cathy Hensley, female    DOB: Sep 19, 1940, 75 y.o.   MRN: [id number]  HPI  75 year old female followed for very severe COPD, chronic hypoxic and hypercarbic respiratory failure (on 3-4 L O2) ,  known alpha 1 antitrypsin heterozygote (MZ)   Cathy Hensley Completed pulmonary rehab 2017   PFT 01/22/15: FVC 1.58 L (53%) FEV1 0.58 L (26%) FEV1/FVC 0.37 FEF 25-75 0.23 L (13%) no bronchodilator response 11/05/14:FVC 1.52 L (51%) FEV1 0.49 L (22%) FEV1/FVC 0.32 FEF 25-75 0.20 L (11%) no bronchodilator response ERV 87% DLCO uncorrected 27%  01/22/15:  Walked 192 meters / Baseline Sat 93% on RA / Nadir Sat 70% on RA (maintained saturation with 3 L/m pulse at 90%)  IMAGING Portable CXR 09/03/14 (previously reviewed by me): Hyperinflation with flattening of the diaphragms. No focal opacity or mass appreciated. No pleural effusion or thickening appreciated. Heart normal in size. Mediastinum normal in contour.  CARDIAC TTE (09/04/14): LV normal in size. Moderate LVH. EF 60-65%. No regional wall motion abnormalities. LA & RA normal in size. RV normal in size and function. Pulmonary artery systolic pressure 30 mmHg. No aortic stenosis or regurgitation. Normal aortic root. Mild mitral regurgitation. No pulmonic regurgitation. Trivial tricuspid regurgitation. Trivial pericardial effusion.  MICROBIOLOGY Sputum (09/06/14): Oral flora Urine Strep Ag (09/06/14): Negative Urine Legionella Ag (09/06/14): Negative Influenza A/B PCR (09/06/14): Negative   LABS 11/06/14 Alpha-1 antitrypsin: MZ (88)  09/06/14 ABG on 3 L/m: 7.40 / 65 / 63 / 91% CBC: 11.5/14.2/46.3/240 BMP: 140/4.4/99/41/19/0.38/118/9.2 LFT: 3.7/6.3/0.5/61/20/21   10/27/2015 Acute OV  ; COPD/Resp Failure on O2.  Pt presents for an acute office visit. Complains of 1 week of nasal drainage , productive cough, aches, sinus pressure/pain. Taking tylenol without much help. Had low grade fever.  Coughing up  thick green mucus. Denies chest pain, orthopnea, edema or hemoptysis.  On O2 at home 2.5l/m , Says O2 sats 95%. At home.  Patient remains on Pulmicort and Brovana nebulizers along with Spiriva daily      Past Medical History:  Diagnosis Date  . 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 COPD 07/13/2006  . HYPERTENSION 07/13/2006  . MENOPAUSAL SYNDROME 07/13/2006  . Other and unspecified hyperlipidemia 07/13/2006  . Respiratory failure (HCC)    Chronic hypoxic   Current Outpatient Prescriptions on File Prior to Visit  Medication Sig Dispense Refill  . acetaminophen (TYLENOL) 500 MG tablet Take 500 mg by mouth every 6 (six) hours as needed.    Marland Kitchen amLODipine (NORVASC) 5 MG tablet take 1 tablet by mouth once daily 90 tablet 3  . arformoterol (BROVANA) 15 MCG/2ML NEBU Take 2 mLs (15 mcg total) by nebulization 2 (two) times daily. 120 mL 3  . aspirin 81 MG chewable tablet Chew 1 tablet (81 mg total) by mouth daily.    . budesonide (PULMICORT) 0.5 MG/2ML nebulizer solution Take 2 mLs (0.5 mg total) by nebulization 2 (two) times daily. 60 mL 12  . furosemide (LASIX) 20 MG tablet 1 tablet daily as needed for fluid retention 30 tablet 3  . levalbuterol (XOPENEX HFA) 45 MCG/ACT inhaler INHALE 2 PUFFS INTO THE LUNGS EVERY 4 HOURS AS NEEDED FOR WHEEZING 15 Inhaler 3  . montelukast (SINGULAIR) 10 MG tablet take 1 tablet by mouth at bedtime 30 tablet 5  . Multiple Vitamins-Minerals (CENTRUM SILVER PO) Take 1 tablet by mouth daily.    . simvastatin (ZOCOR) 20 MG tablet take 1 tablet by mouth at bedtime 90  tablet 1  . SPIRIVA HANDIHALER 18 MCG inhalation capsule inhale the contents of one capsule in the handihaler once daily 30 capsule 12   No current facility-administered medications on file prior to visit.       Review of Systems  Constitutional:   No  weight loss, night sweats,  Fevers, chills, fatigue, or  lassitude.  HEENT:   No headaches,  Difficulty swallowing,  Tooth/dental problems, or   Sore throat,                No sneezing, itching, ear ache, nasal congestion, post nasal drip,   CV:  No chest pain,  Orthopnea, PND, swelling in lower extremities, anasarca, dizziness, palpitations, syncope.   GI  No heartburn, indigestion, abdominal pain, nausea, vomiting, diarrhea, change in bowel habits, loss of appetite, bloody stools.   Resp: No shortness of breath with exertion or at rest.  No excess mucus, no productive cough,  No non-productive cough,  No coughing up of blood.  No change in color of mucus.  No wheezing.  No chest wall deformity  Skin: no rash or lesions.  GU: no dysuria, change in color of urine, no urgency or frequency.  No flank pain, no hematuria   MS:  No joint pain or swelling.  No decreased range of motion.  No back pain.  Psych:  No change in mood or affect. No depression or anxiety.  No memory loss.         Objective:   Physical Exam Vitals:   10/27/15 1003  BP: (!) 144/66  Pulse: 90  Temp: 98.3 F (36.8 C)  TempSrc: Oral  SpO2: 92%  Weight: 115 lb (52.2 kg)  Height: 5\' 5"  (1.651 m)   GEN: A/Ox3; pleasant , NAD, thin chronically ill appearing    HEENT:  Cathy Hensley/AT,  EACs-clear, TMs-wnl, NOSE-clear, THROAT-clear, no lesions, no postnasal drip or exudate noted.   NECK:  Supple w/ fair ROM; no JVD; normal carotid impulses w/o bruits; no thyromegaly or nodules palpated; no lymphadenopathy.    RESP  Decreased BS in bases , few scattered rhonchi  no accessory muscle use, no dullness to percussion  CARD:  RRR, no m/r/g  , no peripheral edema, pulses intact, no cyanosis or clubbing.  GI:   Soft & nt; nml bowel sounds; no organomegaly or masses detected.   Musco: Warm bil, no deformities or joint swelling noted.   Neuro: alert, no focal deficits noted.    Skin: Warm, no lesions or rashes  Cathy Hensley  Cathy Hensley  10/27/2015        Assessment & Plan:

## 2015-10-27 NOTE — Assessment & Plan Note (Signed)
Exacerbation -  Plan  Patient Instructions  Augmentin 875mg  Twice daily  For 7 days ,take with food.  Mucinex DM Twice daily  As needed  Cough/congestion .  Continue Brovana and /Budesonide Neb Twice daily   Continue on Spiriva daily .  Continue on Oxygen .  Follow up Dr. Jamison NeighborNestor as planned in December and As needed   Please contact office for sooner follow up if symptoms do not improve or worsen or seek emergency care

## 2015-10-27 NOTE — Patient Instructions (Signed)
Augmentin 875mg  Twice daily  For 7 days ,take with food.  Mucinex DM Twice daily  As needed  Cough/congestion .  Continue Brovana and /Budesonide Neb Twice daily   Continue on Spiriva daily .  Continue on Oxygen .  Follow up Dr. Jamison Neighbor as planned in December and As needed   Please contact office for sooner follow up if symptoms do not improve or worsen or seek emergency care

## 2015-10-28 NOTE — Progress Notes (Signed)
Note reviewed.   Donna Christen Jamison Neighbor, M.D. [facility] Pager:  [phone] After 3pm or if no response, call [phone] 7:25 AM 10/28/15

## 2015-11-10 DIAGNOSIS — J449 Chronic obstructive pulmonary disease, unspecified: Secondary | ICD-10-CM | POA: Diagnosis not present

## 2015-11-11 ENCOUNTER — Encounter: Payer: Self-pay | Admitting: Adult Health

## 2015-11-11 ENCOUNTER — Ambulatory Visit (INDEPENDENT_AMBULATORY_CARE_PROVIDER_SITE_OTHER)
Admission: RE | Admit: 2015-11-11 | Discharge: 2015-11-11 | Disposition: A | Discharge status: Home / Self Care | Payer: Medicare Other | Source: Ambulatory Visit | Attending: Adult Health | Admitting: Adult Health

## 2015-11-11 ENCOUNTER — Ambulatory Visit (INDEPENDENT_AMBULATORY_CARE_PROVIDER_SITE_OTHER): Payer: Medicare Other | Admitting: Adult Health

## 2015-11-11 VITALS — BP 138/78 | HR 74 | Temp 97.8°F | Ht 65.0 in | Wt 114.8 lb

## 2015-11-11 DIAGNOSIS — J449 Chronic obstructive pulmonary disease, unspecified: Secondary | ICD-10-CM

## 2015-11-11 DIAGNOSIS — J40 Bronchitis, not specified as acute or chronic: Secondary | ICD-10-CM | POA: Diagnosis not present

## 2015-11-11 MED ORDER — PREDNISONE 10 MG PO TABS: ORAL_TABLET | ORAL | 0 refills | Status: DC

## 2015-11-11 MED ORDER — TIOTROPIUM BROMIDE MONOHYDRATE 2.5 MCG/ACT IN AERS: 2.0000 | INHALATION_SPRAY | Freq: Every day | RESPIRATORY_TRACT | 0 refills | Status: DC

## 2015-11-11 NOTE — Addendum Note (Signed)
Addended by: Abigail MiyamotoPHELPS, DENISE D on: 11/11/2015 05:04 PM   Modules accepted: Orders

## 2015-11-11 NOTE — Progress Notes (Signed)
Subjective:    Patient ID: Cathy Hensley, female    DOB: 05/03/1940, 75 y.o.   MRN: [id number]  HPI  75 year old female followed for very severe COPD, chronic hypoxic and hypercarbic respiratory failure (on 3-4 L O2) ,  known alpha 1 antitrypsin heterozygote (MZ)   TEST [name]/Events Completed pulmonary rehab 2017   PFT 01/22/15: FVC 1.58 L (53%) FEV1 0.58 L (26%) FEV1/FVC 0.37 FEF 25-75 0.23 L (13%) no bronchodilator response 11/05/14:FVC 1.52 L (51%) FEV1 0.49 L (22%) FEV1/FVC 0.32 FEF 25-75 0.20 L (11%) no bronchodilator response ERV 87% DLCO uncorrected 27%  6MWT 01/22/15:  Walked 192 meters / Baseline Sat 93% on RA / Nadir Sat 70% on RA (maintained saturation with 3 L/m pulse at 90%)  IMAGING Portable CXR 09/03/14 (previously reviewed by me): Hyperinflation with flattening of the diaphragms. No focal opacity or mass appreciated. No pleural effusion or thickening appreciated. Heart normal in size. Mediastinum normal in contour.  CARDIAC TTE (09/04/14): LV normal in size. Moderate LVH. EF 60-65%. No regional wall motion abnormalities. LA & RA normal in size. RV normal in size and function. Pulmonary artery systolic pressure 30 mmHg. No aortic stenosis or regurgitation. Normal aortic root. Mild mitral regurgitation. No pulmonic regurgitation. Trivial tricuspid regurgitation. Trivial pericardial effusion.  MICROBIOLOGY Sputum (09/06/14): Oral flora Urine Strep Ag (09/06/14): Negative Urine Legionella Ag (09/06/14): Negative Influenza A/B PCR (09/06/14): Negative   LABS 11/06/14 Alpha-1 antitrypsin: MZ (88)  09/06/14 ABG on 3 L/m: 7.40 / 65 / 63 / 91% CBC: 11.5/14.2/46.3/240 BMP: 140/4.4/99/41/19/0.38/118/9.2 LFT: 3.7/6.3/0.5/61/20/21   11/11/2015 Acute OV  ; COPD/Resp Failure on O2.  Pt presents for an acute office visit. Complains of persistent symptoms of cough ,wheezing and sob. Seen 2 weeks ago for COPD flare , tx w/ Augmentin x 7 days  Says she got some better but still has  dry cough . Last couple of days wheezing and dsypnea are picking back up  No discolored mucus or fever.  CXR today shows COPD changes /no acute process.  Denies chest pain, orthopnea, edema or hemoptysis.  On O2 at home 2.5l/m , Says O2 sats 95%. At home.  Patient remains on Pulmicort and Brovana nebulizers along with Spiriva daily Needs xopenex changed to proair so insurance will cover - rare saba use.       Past Medical History:  Diagnosis Date  . 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 COPD 07/13/2006  . HYPERTENSION 07/13/2006  . MENOPAUSAL SYNDROME 07/13/2006  . Other and unspecified hyperlipidemia 07/13/2006  . Respiratory failure (HCC)    Chronic hypoxic   Current Outpatient Prescriptions on File Prior to Visit  Medication Sig Dispense Refill  . acetaminophen (TYLENOL) 500 MG tablet Take 500 mg by mouth every 6 (six) hours as needed.    Marland Kitchen. amLODipine (NORVASC) 5 MG tablet take 1 tablet by mouth once daily 90 tablet 3  . arformoterol (BROVANA) 15 MCG/2ML NEBU Take 2 mLs (15 mcg total) by nebulization 2 (two) times daily. 120 mL 3  . aspirin 81 MG chewable tablet Chew 1 tablet (81 mg total) by mouth daily.    . budesonide (PULMICORT) 0.5 MG/2ML nebulizer solution Take 2 mLs (0.5 mg total) by nebulization 2 (two) times daily. 60 mL 12  . furosemide (LASIX) 20 MG tablet 1 tablet daily as needed for fluid retention 30 tablet 3  . levalbuterol (XOPENEX HFA) 45 MCG/ACT inhaler INHALE 2 PUFFS INTO THE LUNGS EVERY 4 HOURS AS NEEDED FOR WHEEZING 15 Inhaler  3  . montelukast (SINGULAIR) 10 MG tablet take 1 tablet by mouth at bedtime 30 tablet 5  . Multiple Vitamins-Minerals (CENTRUM SILVER PO) Take 1 tablet by mouth daily.    . simvastatin (ZOCOR) 20 MG tablet take 1 tablet by mouth at bedtime 90 tablet 1  . SPIRIVA HANDIHALER 18 MCG inhalation capsule inhale the contents of one capsule in the handihaler once daily 30 capsule 12   No current facility-administered medications on file  prior to visit.       Review of Systems  Constitutional:   No  weight loss, night sweats,  Fevers, chills,  +fatigue, or  lassitude.  HEENT:   No headaches,  Difficulty swallowing,  Tooth/dental problems, or  Sore throat,                No sneezing, itching, ear ache, nasal  +congestion, post nasal drip,   CV:  No chest pain,  Orthopnea, PND, swelling in lower extremities, anasarca, dizziness, palpitations, syncope.   GI  No heartburn, indigestion, abdominal pain, nausea, vomiting, diarrhea, change in bowel habits, loss of appetite, bloody stools.   Resp:    No chest wall deformity  Skin: no rash or lesions.  GU: no dysuria, change in color of urine, no urgency or frequency.  No flank pain, no hematuria   MS:  No joint pain or swelling.  No decreased range of motion.  No back pain.  Psych:  No change in mood or affect. No depression or anxiety.  No memory loss.         Objective:   Physical Exam Vitals:   11/11/15 1529  BP: 138/78  Pulse: 74  Temp: 97.8 F (36.6 C)  TempSrc: Oral  SpO2: 90%  Weight: 114 lb 12.8 oz (52.1 kg)  Height: 5\' 5"  (1.651 m)   GEN: A/Ox3; pleasant , NAD, thin chronically ill appearing    HEENT:  [place]/AT,  EACs-clear, TMs-wnl, NOSE-clear, THROAT-clear, no lesions, no postnasal drip or exudate noted.   NECK:  Supple w/ fair ROM; no JVD; normal carotid impulses w/o bruits; no thyromegaly or nodules palpated; no lymphadenopathy.    RESP  Decreased BS in bases , no wheezing or accessory muscle use, no dullness to percussion  CARD:  RRR, no m/r/g  , no peripheral edema, pulses intact, no cyanosis or clubbing.  GI:   Soft & nt; nml bowel sounds; no organomegaly or masses detected.   Musco: Warm bil, no deformities or joint swelling noted.   Neuro: alert, no focal deficits noted.    Skin: Warm, no lesions or rashes  Porsche Noguchi NP-C  [place]  11/11/2015        Assessment & Plan:

## 2015-11-11 NOTE — Patient Instructions (Addendum)
Change Xopenex to [employer] (for insurance purpose)  Prednisone taper over next week.  Mucinex DM Twice daily  As needed  Cough/congestion .  Continue Brovana and /Budesonide Neb Twice daily   Continue on Spiriva daily .  Continue on Oxygen .  Follow up Dr. Jamison NeighborNestor as planned in December and As needed   Please contact office for sooner follow up if symptoms do not improve or worsen or seek emergency care

## 2015-11-11 NOTE — Assessment & Plan Note (Signed)
Slow to resolve exacerbation  Hold on additional abx for now  cxr ok   Plan  Patient Instructions  Change Xopenex to [employer] Air (for insurance purpose)  Prednisone taper over next week.  Mucinex DM Twice daily  As needed  Cough/congestion .  Continue Brovana and /Budesonide Neb Twice daily   Continue on Spiriva daily .  Continue on Oxygen .  Follow up Dr. Jamison NeighborNestor as planned in December and As needed   Please contact office for sooner follow up if symptoms do not improve or worsen or seek emergency care

## 2015-11-12 DIAGNOSIS — J449 Chronic obstructive pulmonary disease, unspecified: Secondary | ICD-10-CM | POA: Diagnosis not present

## 2015-11-17 NOTE — Progress Notes (Signed)
Note reviewed. No assessment & plan available for review.  Donna Christen Jamison Neighbor, M.D. [facility] Critical [facility] Pager:  [phone] After 3pm or if no response, call [phone] 6:28 PM 11/17/15

## 2015-11-20 ENCOUNTER — Telehealth: Payer: Self-pay | Admitting: Pulmonary Disease

## 2015-11-20 MED ORDER — ALBUTEROL SULFATE HFA 108 (90 BASE) MCG/ACT IN AERS: 2.0000 | INHALATION_SPRAY | Freq: Four times a day (QID) | RESPIRATORY_TRACT | 3 refills | Status: DC | PRN

## 2015-11-20 NOTE — Telephone Encounter (Signed)
Pt says rx was written by TP.Caren Griffins

## 2015-11-20 NOTE — Telephone Encounter (Signed)
Pt seen by TP 11.7.17: Patient Instructions  Change Xopenex to [employer] Air (for insurance purpose)  Prednisone taper over next week.  Mucinex DM Twice daily  As needed  Cough/congestion .  Continue Brovana and /Budesonide Neb Twice daily   Continue on Spiriva daily .  Continue on Oxygen .  Follow up Dr. Jamison NeighborNestor as planned in December and As needed   Please contact office for sooner follow up if symptoms do not improve or worsen or seek emergency care    Pt stated that at the time of the visit she did not need the Rx, but now her current Xopenex is now out and would like Rx for the Albuterol.  Pharmacy verified with patient Rx sent Med list updated Nothing further needed; will sign off

## 2015-12-04 DIAGNOSIS — J449 Chronic obstructive pulmonary disease, unspecified: Secondary | ICD-10-CM | POA: Diagnosis not present

## 2015-12-05 ENCOUNTER — Ambulatory Visit (INDEPENDENT_AMBULATORY_CARE_PROVIDER_SITE_OTHER)
Admission: RE | Admit: 2015-12-05 | Discharge: 2015-12-05 | Disposition: A | Discharge status: Home / Self Care | Payer: Medicare Other | Source: Ambulatory Visit | Attending: Adult Health | Admitting: Adult Health

## 2015-12-05 ENCOUNTER — Encounter: Payer: Self-pay | Admitting: Adult Health

## 2015-12-05 ENCOUNTER — Ambulatory Visit (INDEPENDENT_AMBULATORY_CARE_PROVIDER_SITE_OTHER): Payer: Medicare Other | Admitting: Adult Health

## 2015-12-05 DIAGNOSIS — J9611 Chronic respiratory failure with hypoxia: Secondary | ICD-10-CM

## 2015-12-05 DIAGNOSIS — R05 Cough: Secondary | ICD-10-CM | POA: Diagnosis not present

## 2015-12-05 DIAGNOSIS — J449 Chronic obstructive pulmonary disease, unspecified: Secondary | ICD-10-CM

## 2015-12-05 MED ORDER — PREDNISONE 10 MG PO TABS: ORAL_TABLET | ORAL | 0 refills | Status: DC

## 2015-12-05 MED ORDER — LEVALBUTEROL HCL 0.63 MG/3ML IN NEBU
0.6300 mg | INHALATION_SOLUTION | Freq: Once | RESPIRATORY_TRACT | Status: AC
  Administered 2015-12-05: 0.63 mg via RESPIRATORY_TRACT

## 2015-12-05 MED ORDER — LEVOFLOXACIN 500 MG PO TABS: 500.0000 mg | ORAL_TABLET | Freq: Every day | ORAL | 0 refills | Status: AC

## 2015-12-05 MED ORDER — METHYLPREDNISOLONE ACETATE 80 MG/ML IJ SUSP
80.0000 mg | Freq: Once | INTRAMUSCULAR | Status: AC
  Administered 2015-12-05: 80 mg via INTRAMUSCULAR

## 2015-12-05 NOTE — Addendum Note (Signed)
Addended by: Abigail MiyamotoPHELPS, DENISE D on: 12/05/2015 12:45 PM   Modules accepted: Orders

## 2015-12-05 NOTE — Patient Instructions (Addendum)
Best option is to be admitted: But we will try this :  Levaquin 500mg  daily for 10 days  Prednisone taper over next week.  Eat yogurt .Twice daily   Saline nasal rinses As needed   Mucinex DM Twice daily  As needed  Cough/congestion .  Continue Brovana and /Budesonide Neb Twice daily   Continue on Spiriva daily .  Continue on Oxygen  4 ll/m. To keep oxygen >88-90 %. If you are running lower then can increase to 5-6 l/m with activity . DO NOT USE PULSING OXYGEN  Follow up Dr. Jamison Neighbor in 2 weeks and As needed  .  Please contact office for sooner follow up if symptoms do not improve or worsen or seek emergency care  Chest xray today .  If you are getting worse , not any better then you must go to the ER [facility] for admission.

## 2015-12-05 NOTE — Progress Notes (Signed)
Subjective:    Patient ID: Cathy Hensley, female    DOB: [birth year], 75 y.o.   MRN: [id number]  HPI  75 year old female followed for very severe COPD, chronic hypoxic and hypercarbic respiratory failure (on 3-4 L O2) ,  known alpha 1 antitrypsin heterozygote (MZ)   TEST [name] Completed pulmonary rehab 2017   PFT 01/22/15: FVC 1.58 L (53%) FEV1 0.58 L (26%) FEV1/FVC 0.37 FEF 25-75 0.23 L (13%) no bronchodilator response 11/05/14:FVC 1.52 L (51%) FEV1 0.49 L (22%) FEV1/FVC 0.32 FEF 25-75 0.20 L (11%) no bronchodilator response ERV 87% DLCO uncorrected 27%  01/22/15:  Walked 192 meters / Baseline Sat 93% on RA / Nadir Sat 70% on RA (maintained saturation with 3 L/m pulse at 90%)  IMAGING Portable CXR 09/03/14 (previously reviewed by me): Hyperinflation with flattening of the diaphragms. No focal opacity or mass appreciated. No pleural effusion or thickening appreciated. Heart normal in size. Mediastinum normal in contour.  CARDIAC TTE (09/04/14): LV normal in size. Moderate LVH. EF 60-65%. No regional wall motion abnormalities. LA & RA normal in size. RV normal in size and function. Pulmonary artery systolic pressure 30 mmHg. No aortic stenosis or regurgitation. Normal aortic root. Mild mitral regurgitation. No pulmonic regurgitation. Trivial tricuspid regurgitation. Trivial pericardial effusion.  MICROBIOLOGY Sputum (09/06/14): Oral flora Urine Strep Ag (09/06/14): Negative Urine Legionella Ag (09/06/14): Negative Influenza A/B PCR (09/06/14): Negative   LABS 11/06/14 Alpha-1 antitrypsin: MZ (88)  09/06/14 ABG on 3 L/m: 7.40 / 65 / 63 / 91% CBC: 11.5/14.2/46.3/240 BMP: 140/4.4/99/41/19/0.38/118/9.2 LFT: 3.7/6.3/0.5/61/20/21   12/05/2015 Acute OV  ; COPD/Resp Failure on O2.  Pt presents for an acute office visit. Complains of cough ,wheezing, weakness, sinus congestion and pressure with thick green /brown mucus  and increased sob. Seen 6 weeks ago for slow to resolve.    Has a lot of post nasal drainage.     COPD flare , tx w/ Augmentin x 7 days and prednisone taper .  Says she got better.  CXR at that time showed COPD changes.  Denies chest pain, orthopnea, edema or hemoptysis.  On O2 at home 3.5l/m , Says O2 sats 93%. At home.  On arrival today her O2 sats were 76% on pulsing O2 at 3l/m . Once she rested and placed on 4l/m O2 sats 90-92%.  Patient remains on Pulmicort and Brovana nebulizers along with Spiriva daily We discussed hospital admission , she is very resistant to this .       Past Medical History:  Diagnosis Date  . 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 COPD 07/13/2006  . HYPERTENSION 07/13/2006  . MENOPAUSAL SYNDROME 07/13/2006  . Other and unspecified hyperlipidemia 07/13/2006  . Respiratory failure (HCC)    Chronic hypoxic   Current Outpatient Prescriptions on File Prior to Visit  Medication Sig Dispense Refill  . acetaminophen (TYLENOL) 500 MG tablet Take 500 mg by mouth every 6 (six) hours as needed.    Marland Kitchen albuterol (PROVENTIL HFA;VENTOLIN HFA) 108 (90 Base) MCG/ACT inhaler Inhale 2 puffs into the lungs every 6 (six) hours as needed for wheezing or shortness of breath. 1 Inhaler 3  . amLODipine (NORVASC) 5 MG tablet take 1 tablet by mouth once daily 90 tablet 3  . arformoterol (BROVANA) 15 MCG/2ML NEBU Take 2 mLs (15 mcg total) by nebulization 2 (two) times daily. 120 mL 3  . aspirin 81 MG chewable tablet Chew 1 tablet (81 mg total) by mouth daily.    . budesonide (PULMICORT)  0.5 MG/2ML nebulizer solution Take 2 mLs (0.5 mg total) by nebulization 2 (two) times daily. 60 mL 12  . furosemide (LASIX) 20 MG tablet 1 tablet daily as needed for fluid retention 30 tablet 3  . montelukast (SINGULAIR) 10 MG tablet take 1 tablet by mouth at bedtime 30 tablet 5  . Multiple Vitamins-Minerals (CENTRUM SILVER PO) Take 1 tablet by mouth daily.    . simvastatin (ZOCOR) 20 MG tablet take 1 tablet by mouth at bedtime 90 tablet 1  . Tiotropium  Bromide Monohydrate (SPIRIVA RESPIMAT) 2.5 MCG/ACT AERS Inhale 2 puffs into the lungs daily. 1 Inhaler 0  . SPIRIVA HANDIHALER 18 MCG inhalation capsule inhale the contents of one capsule in the handihaler once daily (Patient not taking: Reported on 12/05/2015) 30 capsule 12   No current facility-administered medications on file prior to visit.       Review of Systems  Constitutional:   No  weight loss, night sweats,  Fevers, chills,  +fatigue, or  lassitude.  HEENT:   No headaches,  Difficulty swallowing,  Tooth/dental problems, or  Sore throat,                No sneezing, itching, ear ache, nasal  +congestion, post nasal drip,   CV:  No chest pain,  Orthopnea, PND, swelling in lower extremities, anasarca, dizziness, palpitations, syncope.   GI  No heartburn, indigestion, abdominal pain, nausea, vomiting, diarrhea, change in bowel habits, loss of appetite, bloody stools.   Resp:    No chest wall deformity  Skin: no rash or lesions.  GU: no dysuria, change in color of urine, no urgency or frequency.  No flank pain, no hematuria   MS:  No joint pain or swelling.  No decreased range of motion.  No back pain.  Psych:  No change in mood or affect. No depression or anxiety.  No memory loss.         Objective:   Physical Exam Vitals:   12/05/15 1101  BP: (!) 100/50  Pulse: 99  Temp: 97.7 F (36.5 C)  TempSrc: Oral  SpO2: (!) 89%  Weight: 113 lb 9.6 oz (51.5 kg)  Height: 5\' 5"  (1.651 m)   GEN: A/Ox3; pleasant , NAD, thin chronically ill appearing    HEENT:  [place]/AT,  EACs-clear, TMs-wnl, NOSE-clear drainage, Max sinus tenderness THROAT-clear, no lesions, no postnasal drip or exudate noted.   NECK:  Supple w/ fair ROM; no JVD; normal carotid impulses w/o bruits; no thyromegaly or nodules palpated; no lymphadenopathy.    RESP  Decreased BS in bases , no wheezing or accessory muscle use, no dullness to percussion  CARD:  RRR, no m/r/g  , no peripheral edema, pulses intact,  no cyanosis or clubbing.  GI:   Soft & nt; nml bowel sounds; no organomegaly or masses detected.   Musco: Warm bil, no deformities or joint swelling noted.   Neuro: alert, no focal deficits noted.    Skin: Warm, no lesions or rashes  [name] Parrett NP-C  [place]  12/05/2015        Assessment & Plan:

## 2015-12-05 NOTE — Assessment & Plan Note (Signed)
Patient Instructions  Continue on Oxygen  4 ll/m. To keep oxygen >88-90 %. If you are running lower then can increase to 5-6 l/m with activity . DO NOT USE PULSING OXYGEN  Follow up Dr. Jamison Neighbor in 2 weeks and As needed  .  Please contact office for sooner follow up if symptoms do not improve or worsen or seek emergency care  Chest xray today .  If you are getting worse , not any better then you must go to the ER [facility] for admission.

## 2015-12-05 NOTE — Assessment & Plan Note (Signed)
Very severe COPD with frequent exacerbations -Acute bronchitis /Sinusitis  Recommended hospital admission several times to her and her husband unfortunately she declines . She says she will do better at home , she assures me she will go to ER if worse or call back if not improving.   Depo Medrol 80mg  IM x 1  Xopenex neb x 1   Plan  Patient Instructions  Best option is to be admitted: But we will try this :  Levaquin 500mg  daily for 10 days  Prednisone taper over next week.  Eat yogurt .Twice daily   Saline nasal rinses As needed   Mucinex DM Twice daily  As needed  Cough/congestion .  Continue Brovana and /Budesonide Neb Twice daily   Continue on Spiriva daily .  Continue on Oxygen  4 ll/m. To keep oxygen >88-90 %. If you are running lower then can increase to 5-6 l/m with activity . DO NOT USE PULSING OXYGEN  Follow up Dr. Jamison Neighbor in 2 weeks and As needed  .  Please contact office for sooner follow up if symptoms do not improve or worsen or seek emergency care  Chest xray today .  If you are getting worse , not any better then you must go to the ER [facility] for admission.

## 2015-12-08 ENCOUNTER — Telehealth: Payer: Self-pay | Admitting: Adult Health

## 2015-12-08 NOTE — Telephone Encounter (Signed)
Pt seen by TP on 12.1.17 for HFU: Patient Instructions  Best option is to be admitted: But we will try this :  Levaquin 500mg  daily for 10 days  Prednisone taper over next week.  Eat yogurt .Twice daily   Saline nasal rinses As needed   Mucinex DM Twice daily  As needed  Cough/congestion .  Continue Brovana and /Budesonide Neb Twice daily   Continue on Spiriva daily .  Continue on Oxygen  4 ll/m. To keep oxygen >88-90 %. If you are running lower then can increase to 5-6 l/m with activity . DO NOT USE PULSING OXYGEN  Follow up Dr. Jamison NeighborNestor in 2 weeks and As needed  .  Please contact office for sooner follow up if symptoms do not improve or worsen or seek emergency care  Chest xray today .  If you are getting worse , not any better then you must go to the ER [facility] for admission.    Called spoke with patient who reported she did speak with TP evening of 12.1.17 and was informed of her cxr results and to contintue TP's recs from that visit.  Pt stated that she does feel better from that visit on Friday and would like to know if there is anything further that she can do to keep herself well and out of the hospital.  Advised pt to do a lot of handwashing and to avoid close contact with crowds (ie bank, grocery store, church, family coming to visit).  Pt voiced her understanding.  Pt is also aware to contact the office if her symptoms worsen in any way.  Pt also asked if she can take Acetaminophen with her Mucinex > advised this is okay to do.  Will sign and forward to TP to inform her that pt is improving and to see if she has any recommendations to add

## 2015-12-10 DIAGNOSIS — J449 Chronic obstructive pulmonary disease, unspecified: Secondary | ICD-10-CM | POA: Diagnosis not present

## 2015-12-16 ENCOUNTER — Ambulatory Visit: Payer: Self-pay | Admitting: Internal Medicine

## 2015-12-19 ENCOUNTER — Encounter: Payer: Self-pay | Admitting: Pulmonary Disease

## 2015-12-19 ENCOUNTER — Ambulatory Visit (INDEPENDENT_AMBULATORY_CARE_PROVIDER_SITE_OTHER): Payer: Medicare Other | Admitting: Pulmonary Disease

## 2015-12-19 VITALS — BP 132/64 | HR 83 | Ht 65.0 in | Wt 111.0 lb

## 2015-12-19 DIAGNOSIS — J9611 Chronic respiratory failure with hypoxia: Secondary | ICD-10-CM | POA: Diagnosis not present

## 2015-12-19 DIAGNOSIS — J449 Chronic obstructive pulmonary disease, unspecified: Secondary | ICD-10-CM | POA: Diagnosis not present

## 2015-12-19 DIAGNOSIS — J189 Pneumonia, unspecified organism: Secondary | ICD-10-CM

## 2015-12-19 DIAGNOSIS — J9612 Chronic respiratory failure with hypercapnia: Secondary | ICD-10-CM

## 2015-12-19 MED ORDER — TIOTROPIUM BROMIDE MONOHYDRATE 1.25 MCG/ACT IN AERS: 4.0000 | INHALATION_SPRAY | Freq: Every day | RESPIRATORY_TRACT | 0 refills | Status: DC

## 2015-12-19 NOTE — Progress Notes (Signed)
\\  Subjective:    Patient ID: Cathy Hensley, female    DOB: 08/11/1940, 75 y.o.   MRN: [id number]  C.C.:  Follow-up for Severe CAP, Very Severe COPD, Chronic Allergic Rhinitis, Chronic Hypoxic Respiratory Failure, & Chronic Hypercarbic Respiratory Failure.  HPI Severe CAP:  Patient found to have left lung pneumonia on her CXR on 12/1. Treated with Levaquin. She reports her mucus is now clear.   Very Severe COPD:  Prescribed Brovana & Budesonide nebs twice daily along with Spiriva. Last seen 12/1 with acute bronchitis/sinusitis. Patient given Levaquin for 10 days, Depo-Medrol injection IM, & a Xopenex nebulizer treatment. Patient has had multiple visits since her last appointment with me in our office for exacerbations. She reports her cough and wheezing have nearly essentially subsided. Husband reports her breathing has improved. Reports she hasn't needed her rescue medication in a couple of weeks.   Chronic Allergic Rhinitis:  Prescribed Singulair. Still with minimal postnasal drainage.  Chronic Hypoxic Respiratory Failure: Prescribed oxygen 3 L/m pulse dose to maintain saturation with ambulation. Oxygen increased to 4 L/m at rest and 5-6 L/m with exertion at last appointment. She has weaned herself down to 3.5 L/m on pulse.   Chronic Hypercarbic Respiratory Failure: Secondary to COPD. Not currently on NIPPV.   Review of Systems She reports she was having intermittent chills. No chest pain or pressure. No fever or sweats. No nausea, emesis, or abdominal pain.   No Known Allergies  Current Outpatient Prescriptions on File Prior to Visit  Medication Sig Dispense Refill  . acetaminophen (TYLENOL) 500 MG tablet Take 500 mg by mouth every 6 (six) hours as needed.    Marland Kitchen. albuterol (PROVENTIL HFA;VENTOLIN HFA) 108 (90 Base) MCG/ACT inhaler Inhale 2 puffs into the lungs every 6 (six) hours as needed for wheezing or shortness of breath. 1 Inhaler 3  . amLODipine (NORVASC) 5 MG tablet take 1 tablet  by mouth once daily 90 tablet 3  . arformoterol (BROVANA) 15 MCG/2ML NEBU Take 2 mLs (15 mcg total) by nebulization 2 (two) times daily. 120 mL 3  . aspirin 81 MG chewable tablet Chew 1 tablet (81 mg total) by mouth daily.    . budesonide (PULMICORT) 0.5 MG/2ML nebulizer solution Take 2 mLs (0.5 mg total) by nebulization 2 (two) times daily. 60 mL 12  . furosemide (LASIX) 20 MG tablet 1 tablet daily as needed for fluid retention 30 tablet 3  . montelukast (SINGULAIR) 10 MG tablet take 1 tablet by mouth at bedtime 30 tablet 5  . Multiple Vitamins-Minerals (CENTRUM SILVER PO) Take 1 tablet by mouth daily.    . simvastatin (ZOCOR) 20 MG tablet take 1 tablet by mouth at bedtime 90 tablet 1  . SPIRIVA HANDIHALER 18 MCG inhalation capsule inhale the contents of one capsule in the handihaler once daily 30 capsule 12   No current facility-administered medications on file prior to visit.     Past Medical History:  Diagnosis Date  . 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 COPD 07/13/2006  . HYPERTENSION 07/13/2006  . MENOPAUSAL SYNDROME 07/13/2006  . Other and unspecified hyperlipidemia 07/13/2006  . Respiratory failure (HCC)    Chronic hypoxic    Past Surgical History:  Procedure Laterality Date  . TONSILLECTOMY      Family History  Problem Relation Age of Onset  . AAA (abdominal aortic aneurysm) Mother   . Emphysema Mother   . Aneurysm Sister     brain  . Emphysema Father   .  Lymphoma Sister     Social History   Social History  . Marital status: Married    Spouse name: N/A  . Number of children: 2  . Years of education: N/A   Occupational History  . retired    Social History Main Topics  . Smoking status: Former Smoker    Packs/day: 0.50    Years: 42.00    Types: Cigarettes    Quit date: 09/23/2000  . Smokeless tobacco: Never Used  . Alcohol use 0.6 oz/week    1 Standard drinks or equivalent per week     Comment: 1 glass of wine nearly daily.  . Drug use: No  .  Sexual activity: Not Asked   Other Topics Concern  . None   Social History Narrative   Lives at home with her husband. Retired.      [facility]:   She is originally from [place]. She has 2 daughters, one lives in [place] & the other in [place]. Previously worked Clinical cytogeneticist a Musician, ran a Lawyer, clerical work, Lawyer. She has no pets currently. No previous bird, mold, or hot tub exposure.       Objective:   Physical Exam BP 132/64 (BP Location: Left Arm, Cuff Size: Normal)   Pulse 83   Ht 5\' 5"  (1.651 m)   Wt 111 lb (50.3 kg)   SpO2 94%   BMI 18.47 kg/m  General:  Appears well. Comfortable. Accompanied by husband today. Integument:  Warm & dry. No rash on exposed skin.  HEENT:  Moist mucus membranes. No scleral icterus. No oral ulcers. Cardiovascular:  Regular rate. No edema. Regular rhythm. Pulmonary:  Decreased urination that symmetric. Prolonged exhalation phase. Normal work of breathing on supplemental oxygen. Abdomen: Soft. Normal bowel sounds. Nondistended.  Musculoskeletal:  Normal bulk and tone. No joint deformity or effusion appreciated.  PFT 01/22/15: FVC 1.58 L (53%) FEV1 0.58 L (26%) FEV1/FVC 0.37 FEF 25-75 0.23 L (13%) no bronchodilator response 11/05/14:FVC 1.52 L (51%) FEV1 0.49 L (22%) FEV1/FVC 0.32 FEF 25-75 0.20 L (11%) no bronchodilator response ERV 87% DLCO uncorrected 27%  01/22/15:  Walked 192 meters / Baseline Sat 93% on RA / Nadir Sat 70% on RA (maintained saturation with 3 L/m pulse at 90%)  IMAGING CXR PA/LAT 12/05/15 (personally reviewed by me):  Increased interstitial and patchy alveolar opacities in left lung. No pleural effusion. Heart and mediastinum normal in contour.   Portable CXR 09/03/14 (previously reviewed by me): Hyperinflation with flattening of the diaphragms. No focal opacity or mass appreciated. No pleural effusion or thickening appreciated. Heart normal in size. Mediastinum normal in  contour.  CARDIAC TTE (09/04/14): LV normal in size. Moderate LVH. EF 60-65%. No regional wall motion abnormalities. LA & RA normal in size. RV normal in size and function. Pulmonary artery systolic pressure 30 mmHg. No aortic stenosis or regurgitation. Normal aortic root. Mild mitral regurgitation. No pulmonic regurgitation. Trivial tricuspid regurgitation. Trivial pericardial effusion.  MICROBIOLOGY Sputum (09/06/14): Oral flora Urine Strep Ag (09/06/14): Negative Urine Legionella Ag (09/06/14): Negative Influenza A/B PCR (09/06/14): Negative   LABS 11/06/14 Alpha-1 antitrypsin: MZ (88)  09/06/14 ABG on 3 L/m: 7.40 / 65 / 63 / 91% CBC: 11.5/14.2/46.3/240 BMP: 140/4.4/99/41/19/0.38/118/9.2 LFT: 3.7/6.3/0.5/61/20/21    Assessment & Plan:  75 y.o. female with very severe COPD and chronic respiratory failure from hypoxia and hypercapnia. Patient seems to be recovering from her left lung severe community acquired pneumonia. I personally reviewed her chest x-ray which  does show a left lung infiltrate. This will need to be reassessed with imaging. I had a lengthy discussion today regarding the patient's clinical trajectory which is a very unfavorable one. I did caution her against further potential worsening in her pulmonary function and the patient reports to me that she plans on clarifying her end-of-life wishes as well as CODE STATUS with her children. She reports she would wish to die at home rather than in a hospital bed. I encouraged the patient to be frank and up front with her children regarding the severity of her pulmonary illness. I instructed her to contact my office if she had any new breathing problems or questions before next appointment.  1. Severe CAP: Repeat chest x-ray PA/LAT and next appointment. 2. Very severe COPD: Continuing Brovana, budesonide, and Spiriva without change. 3. Chronic Hypoxic Respiratory Failure: Patient to return to her previous oxygen flow rates given  improvement. 4. Chronic Hypercarbic Respiratory Failure: Holding off on noninvasive positive pressure ventilation at this time. Patient again cautioned against potential worsening of V/Q mismatching with hyperoxygenation. 5. Chronic Allergic Rhinitis: Well controlled with Singulair. No changes. 6. Health Maintenance: S/P Influenza Vaccine August 2017, Pneumovax March 2012, Prevnar January 2015, & Tdap June 2012.  7. Follow-up: Return to clinic in January as previously scheduled.  Donna Christen Jamison Neighbor, M.D. [facility] Pager:  [phone] After 3pm or if no response, call [phone] 3:46 PM 12/19/15

## 2015-12-19 NOTE — Patient Instructions (Addendum)
   Keep using your medications as prescribed.  You can continue to use Mucinex or Guaifenesin twice daily.  Call me if you have any new breathing problems before your next appointment.   Remember to get your chest X-ray at your follow-up appointment.   TESTS ORDERED: 1. CXR PA/LAT at next appointment

## 2015-12-22 ENCOUNTER — Ambulatory Visit: Payer: Self-pay | Admitting: Pulmonary Disease

## 2015-12-31 DIAGNOSIS — J449 Chronic obstructive pulmonary disease, unspecified: Secondary | ICD-10-CM | POA: Diagnosis not present

## 2016-01-10 DIAGNOSIS — J449 Chronic obstructive pulmonary disease, unspecified: Secondary | ICD-10-CM | POA: Diagnosis not present

## 2016-01-21 ENCOUNTER — Ambulatory Visit: Payer: Self-pay | Admitting: Pulmonary Disease

## 2016-02-02 DIAGNOSIS — J449 Chronic obstructive pulmonary disease, unspecified: Secondary | ICD-10-CM | POA: Diagnosis not present

## 2016-02-09 ENCOUNTER — Telehealth: Payer: Self-pay | Admitting: Pulmonary Disease

## 2016-02-09 NOTE — Telephone Encounter (Signed)
Spoke with pt, wanted to know if it's safe to keep appt on Wednesday d/t flu.  I advised pt that this office visit was to follow up on her pna but ultimately it was up to her if she wanted to cx her appt.  Pt will keep appt, aware to get cxr before appt.  Nothing further needed .

## 2016-02-10 DIAGNOSIS — J449 Chronic obstructive pulmonary disease, unspecified: Secondary | ICD-10-CM | POA: Diagnosis not present

## 2016-02-11 ENCOUNTER — Ambulatory Visit (INDEPENDENT_AMBULATORY_CARE_PROVIDER_SITE_OTHER)
Admission: RE | Admit: 2016-02-11 | Discharge: 2016-02-11 | Disposition: A | Discharge status: Home / Self Care | Payer: Medicare Other | Source: Ambulatory Visit | Attending: Pulmonary Disease | Admitting: Pulmonary Disease

## 2016-02-11 ENCOUNTER — Encounter: Payer: Self-pay | Admitting: Pulmonary Disease

## 2016-02-11 ENCOUNTER — Ambulatory Visit (INDEPENDENT_AMBULATORY_CARE_PROVIDER_SITE_OTHER): Payer: Medicare Other | Admitting: Pulmonary Disease

## 2016-02-11 VITALS — BP 116/70 | HR 81 | Ht 65.0 in | Wt 115.2 lb

## 2016-02-11 DIAGNOSIS — J309 Allergic rhinitis, unspecified: Secondary | ICD-10-CM

## 2016-02-11 DIAGNOSIS — J9612 Chronic respiratory failure with hypercapnia: Secondary | ICD-10-CM

## 2016-02-11 DIAGNOSIS — J449 Chronic obstructive pulmonary disease, unspecified: Secondary | ICD-10-CM

## 2016-02-11 DIAGNOSIS — J189 Pneumonia, unspecified organism: Secondary | ICD-10-CM | POA: Diagnosis not present

## 2016-02-11 DIAGNOSIS — J9611 Chronic respiratory failure with hypoxia: Secondary | ICD-10-CM | POA: Diagnosis not present

## 2016-02-11 NOTE — Patient Instructions (Signed)
   Continue using your inhaler and nebulizer medications as prescribed.  Call me if you have any new breathing problems before your next appointment.  I will see you back in 6 months or sooner if needed.

## 2016-02-11 NOTE — Progress Notes (Signed)
\\  Subjective:    Patient ID: Cathy Hensley, female    DOB: 18-Jan-1940, 76 y.o.   MRN: [id number]  C.C.:  Follow-up for Severe CAP, Very Severe COPD, Chronic Allergic Rhinitis, Chronic Hypoxic Respiratory Failure, & Chronic Hypercarbic Respiratory Failure.  HPI Severe CAP:  Previously treated with Levaquin and December. Her CXR shows improved interstitial infiltrates.  Very Severe COPD:  Currently prescribed robe on a, budesonide, & Spiriva. She denies any coughing or wheezing. She reports her previous dyspnea has been gradually improving. She has been doing her exercises at home rather than the gym. No exacerbations since last appointment. She reports she is using her rescue medications a couple of times a month.   Chronic Allergic Rhinitis:  Currently prescribed Singulair. She reports mild sinus congestion & drainage. No pressure or pain.   Chronic Hypoxic Respiratory Failure: Currently prescribed oxygen at 3 L/m pulse dose to maintain saturation with ambulation. She reports she has tapered back to 2.5 L/m without adverse symptoms.   Chronic Hypercarbic Respiratory Failure: Currently not on noninvasive positive pressure ventilation. Secondary to COPD.  Review of Systems No fever or chills. No chest pain or pressure. No abdominal pain or nausea.   No Known Allergies  Current Outpatient Prescriptions on File Prior to Visit  Medication Sig Dispense Refill  . acetaminophen (TYLENOL) 500 MG tablet Take 500 mg by mouth every 6 (six) hours as needed.    Marland Kitchen albuterol (PROVENTIL HFA;VENTOLIN HFA) 108 (90 Base) MCG/ACT inhaler Inhale 2 puffs into the lungs every 6 (six) hours as needed for wheezing or shortness of breath. 1 Inhaler 3  . amLODipine (NORVASC) 5 MG tablet take 1 tablet by mouth once daily 90 tablet 3  . arformoterol (BROVANA) 15 MCG/2ML NEBU Take 2 mLs (15 mcg total) by nebulization 2 (two) times daily. 120 mL 3  . aspirin 81 MG chewable tablet Chew 1 tablet (81 mg total) by mouth  daily.    . budesonide (PULMICORT) 0.5 MG/2ML nebulizer solution Take 2 mLs (0.5 mg total) by nebulization 2 (two) times daily. 60 mL 12  . furosemide (LASIX) 20 MG tablet 1 tablet daily as needed for fluid retention 30 tablet 3  . montelukast (SINGULAIR) 10 MG tablet take 1 tablet by mouth at bedtime 30 tablet 5  . Multiple Vitamins-Minerals (CENTRUM SILVER PO) Take 1 tablet by mouth daily.    . simvastatin (ZOCOR) 20 MG tablet take 1 tablet by mouth at bedtime 90 tablet 1  . SPIRIVA HANDIHALER 18 MCG inhalation capsule inhale the contents of one capsule in the handihaler once daily 30 capsule 12  . Tiotropium Bromide Monohydrate (SPIRIVA RESPIMAT) 1.25 MCG/ACT AERS Inhale 4 puffs into the lungs daily. 1 Inhaler 0   No current facility-administered medications on file prior to visit.     Past Medical History:  Diagnosis Date  . 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 COPD 07/13/2006  . HYPERTENSION 07/13/2006  . MENOPAUSAL SYNDROME 07/13/2006  . Other and unspecified hyperlipidemia 07/13/2006  . Respiratory failure (HCC)    Chronic hypoxic    Past Surgical History:  Procedure Laterality Date  . TONSILLECTOMY      Family History  Problem Relation Age of Onset  . AAA (abdominal aortic aneurysm) Mother   . Emphysema Mother   . Aneurysm Sister     brain  . Emphysema Father   . Lymphoma Sister     Social History   Social History  . Marital status: Married  Spouse name: N/A  . Number of children: 2  . Years of education: N/A   Occupational History  . retired    Social History Main Topics  . Smoking status: Former Smoker    Packs/day: 0.50    Years: 42.00    Types: Cigarettes    Quit date: 09/23/2000  . Smokeless tobacco: Never Used  . Alcohol use 0.6 oz/week    1 Standard drinks or equivalent per week     Comment: 1 glass of wine nearly daily.  . Drug use: No  . Sexual activity: Not Asked   Other Topics Concern  . None   Social History Narrative   Lives  at home with her husband. Retired.      [facility]:   She is originally from [place]. She has 2 daughters, one lives in [place] & the other in [place]. Previously worked Clinical cytogeneticist a Musician, ran a Lawyer, clerical work, Lawyer. She has no pets currently. No previous bird, mold, or hot tub exposure.       Objective:   Physical Exam BP 116/70 (BP Location: Right Arm, Patient Position: Sitting, Cuff Size: Normal)   Pulse 81   Ht 5\' 5"  (1.651 m)   Wt 115 lb 3.2 oz (52.3 kg)   SpO2 94%   BMI 19.17 kg/m   General:  Awake. Alert. No distress. Thin, frail female. Integument:  Warm & dry. No rash on exposed skin.  Extremities:  No cyanosis or clubbing.  HEENT:  Moist mucus membranes. No oral ulcers. Mild bilateral nasal turbinate swelling. No scleral icterus. Cardiovascular:  Regular rate. No edema. Normal S1 & S2.  Pulmonary:  Symmetrically decreased aeration. Clear with auscultation. No accessory muscle use on supplemental oxygen. Abdomen: Soft. Normal bowel sounds. Nondistended.  Musculoskeletal:  Normal bulk and tone. No joint deformity or effusion appreciated.  PFT 01/22/15: FVC 1.58 L (53%) FEV1 0.58 L (26%) FEV1/FVC 0.37 FEF 25-75 0.23 L (13%) no bronchodilator response 11/05/14:FVC 1.52 L (51%) FEV1 0.49 L (22%) FEV1/FVC 0.32 FEF 25-75 0.20 L (11%) no bronchodilator response ERV 87% DLCO uncorrected 27%  01/22/15:  Walked 192 meters / Baseline Sat 93% on RA / Nadir Sat 70% on RA (maintained saturation with 3 L/m pulse at 90%)  IMAGING CXR PA/LAT 02/11/16 (personally reviewed by me):  Resolution of previous interstitial prominence. No new focal opacity or mass appreciated. Hyperinflation with flattening of the diaphragms. Heart normal in size & mediastinum normal in contour.  CXR PA/LAT 12/05/15 (previously reviewed by me):  Increased interstitial and patchy alveolar opacities in left lung. No pleural effusion. Heart and mediastinum normal in  contour.   Portable CXR 09/03/14 (previously reviewed by me): Hyperinflation with flattening of the diaphragms. No focal opacity or mass appreciated. No pleural effusion or thickening appreciated. Heart normal in size. Mediastinum normal in contour.  CARDIAC TTE (09/04/14): LV normal in size. Moderate LVH. EF 60-65%. No regional wall motion abnormalities. LA & RA normal in size. RV normal in size and function. Pulmonary artery systolic pressure 30 mmHg. No aortic stenosis or regurgitation. Normal aortic root. Mild mitral regurgitation. No pulmonic regurgitation. Trivial tricuspid regurgitation. Trivial pericardial effusion.  MICROBIOLOGY Sputum (09/06/14): Oral flora Urine Strep Ag (09/06/14): Negative Urine Legionella Ag (09/06/14): Negative Influenza A/B PCR (09/06/14): Negative   LABS 11/06/14 Alpha-1 antitrypsin: MZ (88)  09/06/14 ABG on 3 L/m: 7.40 / 65 / 63 / 91% CBC: 11.5/14.2/46.3/240 BMP: 140/4.4/99/41/19/0.38/118/9.2 LFT: 3.7/6.3/0.5/61/20/21    Assessment & Plan:  76 y.o. female with very severe COPD along with chronic hypoxic and hypercarbic respiratory failure. Patient seems to be recovering from her severe to be acquired pneumonia with resolution of previous interstitial prominence as reviewed by me today. With resolution of pneumonia patient's dyspnea is improving. Patient's COPD seems to remain stable at this time as does her oxygen requirement. We did briefly discuss initiating noninvasive positive pressure ventilation at this time but I do not feel that this is necessary or would be of great symptomatic benefit for the patient. I encouraged her to continue to avoid anyone with a respiratory illness. I instructed her to contact my office if she had any new breathing problems or questions before next appointment.  1. Severe CAP: Resolved on repeat chest x-ray imaging today. 2. Very Severe COPD:  Continuing patient on Brovana, Pulmicort, & Spiriva. No changes at this time.   3. Chronic Hypoxic Respiratory Failure:  Continuing patient on oxygen therapy as previously prescribed.  4. Chronic Hypercarbic Respiratory Failure:   Continuing to hold off on initiating noninvasive positive pressure ventilation at this time. Encouraged patient to remain active and avoid hyperoxygenation. 5. Chronic Allergic Rhinitis:  Continuing Singulair. No changes.  6. Health Maintenance: S/P Influenza Vaccine August 2017, Pneumovax March 2012, Prevnar January 2015, & Tdap June 2012.  7. Follow-up: Return to clinic in 6 months or sooner if needed.  Donna Christen Jamison Neighbor, M.D. [facility] Pager:  [phone] After 3pm or if no response, call [phone] 2:55 PM 02/11/16

## 2016-03-01 DIAGNOSIS — J449 Chronic obstructive pulmonary disease, unspecified: Secondary | ICD-10-CM | POA: Diagnosis not present

## 2016-03-09 DIAGNOSIS — J449 Chronic obstructive pulmonary disease, unspecified: Secondary | ICD-10-CM | POA: Diagnosis not present

## 2016-03-25 ENCOUNTER — Other Ambulatory Visit: Payer: Self-pay | Admitting: Internal Medicine

## 2016-04-02 DIAGNOSIS — J449 Chronic obstructive pulmonary disease, unspecified: Secondary | ICD-10-CM | POA: Diagnosis not present

## 2016-04-09 DIAGNOSIS — J449 Chronic obstructive pulmonary disease, unspecified: Secondary | ICD-10-CM | POA: Diagnosis not present

## 2016-05-03 DIAGNOSIS — J449 Chronic obstructive pulmonary disease, unspecified: Secondary | ICD-10-CM | POA: Diagnosis not present

## 2016-05-09 DIAGNOSIS — J449 Chronic obstructive pulmonary disease, unspecified: Secondary | ICD-10-CM | POA: Diagnosis not present

## 2016-06-01 DIAGNOSIS — J449 Chronic obstructive pulmonary disease, unspecified: Secondary | ICD-10-CM | POA: Diagnosis not present

## 2016-06-09 DIAGNOSIS — J449 Chronic obstructive pulmonary disease, unspecified: Secondary | ICD-10-CM | POA: Diagnosis not present

## 2016-07-01 DIAGNOSIS — J449 Chronic obstructive pulmonary disease, unspecified: Secondary | ICD-10-CM | POA: Diagnosis not present

## 2016-07-09 DIAGNOSIS — J449 Chronic obstructive pulmonary disease, unspecified: Secondary | ICD-10-CM | POA: Diagnosis not present

## 2016-07-27 DIAGNOSIS — J449 Chronic obstructive pulmonary disease, unspecified: Secondary | ICD-10-CM | POA: Diagnosis not present

## 2016-08-09 DIAGNOSIS — J449 Chronic obstructive pulmonary disease, unspecified: Secondary | ICD-10-CM | POA: Diagnosis not present

## 2016-08-27 ENCOUNTER — Ambulatory Visit (INDEPENDENT_AMBULATORY_CARE_PROVIDER_SITE_OTHER): Payer: Medicare Other | Admitting: Pulmonary Disease

## 2016-08-27 ENCOUNTER — Encounter: Payer: Self-pay | Admitting: Pulmonary Disease

## 2016-08-27 VITALS — BP 126/66 | HR 81 | Ht 65.0 in | Wt 112.0 lb

## 2016-08-27 DIAGNOSIS — Z23 Encounter for immunization: Secondary | ICD-10-CM | POA: Diagnosis not present

## 2016-08-27 DIAGNOSIS — J9612 Chronic respiratory failure with hypercapnia: Secondary | ICD-10-CM

## 2016-08-27 DIAGNOSIS — J9611 Chronic respiratory failure with hypoxia: Secondary | ICD-10-CM | POA: Diagnosis not present

## 2016-08-27 DIAGNOSIS — J449 Chronic obstructive pulmonary disease, unspecified: Secondary | ICD-10-CM | POA: Diagnosis not present

## 2016-08-27 MED ORDER — TIOTROPIUM BROMIDE MONOHYDRATE 2.5 MCG/ACT IN AERS: 2.0000 | INHALATION_SPRAY | Freq: Every day | RESPIRATORY_TRACT | 0 refills | Status: DC

## 2016-08-27 NOTE — Patient Instructions (Addendum)
   Continue using her nebulizer and inhaler medications as prescribed.  Remember to call back in November to inquire about the nebulized form of "Spiriva" that is coming out from [facility].   Contact me if you have any new breathing problems or questions before your next appointment.

## 2016-08-27 NOTE — Progress Notes (Signed)
\\  Subjective:    Patient ID: Cathy Hensley, female    DOB: 1940/04/05, 76 y.o.   MRN: [id number]  C.C.:  Follow-up for Very Severe COPD, Chronic Hypoxic Respiratory Failure, Chronic Hypercarbic Respiratory Failure, & Chronic Allergic Rhinitis.   HPI Very severe COPD: Prescribed Pulmicort, Brovana, & Spiriva. Previously doing exercises at home. She reports she is adherent to her regimen. She denies any exacerbations since last appointment. No change in her baseline dyspnea. She is now exercising at [employer]. She reports her dyspnea may be slightly improved with the weather changes. No coughing or wheezing.  Chronic hypoxic respiratory failure: Prescribed oxygen at 3 L/m pulse dose with ambulation. Previously tapered to 2.5 L/m without adverse effect. She is continuing to use her oxygen as prescribed.   Chronic hypercarbic respiratory failure: Not currently on noninvasive positive pressure ventilation. Secondary to COPD.  Chronic allergic rhinitis: Prescribed Singulair. She reports she still has some sinus drainage but no congestoin or pressure.  Review of Systems  No chest pain or pressure. No fever or chills. No abdominal pain or nausea.   No Known Allergies  Current Outpatient Prescriptions on File Prior to Visit  Medication Sig Dispense Refill  . acetaminophen (TYLENOL) 500 MG tablet Take 500 mg by mouth every 6 (six) hours as needed.    Marland Kitchen albuterol (PROVENTIL HFA;VENTOLIN HFA) 108 (90 Base) MCG/ACT inhaler Inhale 2 puffs into the lungs every 6 (six) hours as needed for wheezing or shortness of breath. 1 Inhaler 3  . amLODipine (NORVASC) 5 MG tablet take 1 tablet by mouth once daily 90 tablet 3  . arformoterol (BROVANA) 15 MCG/2ML NEBU Take 2 mLs (15 mcg total) by nebulization 2 (two) times daily. 120 mL 3  . aspirin 81 MG chewable tablet Chew 1 tablet (81 mg total) by mouth daily.    . budesonide (PULMICORT) 0.5 MG/2ML nebulizer solution Take 2 mLs (0.5 mg total) by nebulization  2 (two) times daily. 60 mL 12  . furosemide (LASIX) 20 MG tablet 1 tablet daily as needed for fluid retention 30 tablet 3  . montelukast (SINGULAIR) 10 MG tablet TAKE 1 TABLET BY MOUTH AT BEDTIME 30 tablet 5  . Multiple Vitamins-Minerals (CENTRUM SILVER PO) Take 1 tablet by mouth daily.    Marland Kitchen Phenylephrine-DM-GG-APAP (MUCINEX FAST-MAX COLD FLU) 5-10-200-325 MG TABS Take by mouth as needed.    . simvastatin (ZOCOR) 20 MG tablet take 1 tablet by mouth at bedtime 90 tablet 1  . SPIRIVA HANDIHALER 18 MCG inhalation capsule inhale the contents of one capsule in the handihaler once daily 30 capsule 12   No current facility-administered medications on file prior to visit.     Past Medical History:  Diagnosis Date  . 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 COPD 07/13/2006  . HYPERTENSION 07/13/2006  . MENOPAUSAL SYNDROME 07/13/2006  . Other and unspecified hyperlipidemia 07/13/2006  . Respiratory failure (HCC)    Chronic hypoxic    Past Surgical History:  Procedure Laterality Date  . TONSILLECTOMY      Family History  Problem Relation Age of Onset  . AAA (abdominal aortic aneurysm) Mother   . Emphysema Mother   . Aneurysm Sister        brain  . Emphysema Father   . Lymphoma Sister     Social History   Social History  . Marital status: Married    Spouse name: N/A  . Number of children: 2  . Years of education: N/A   Occupational  History  . retired    Social History Main Topics  . Smoking status: Former Smoker    Packs/day: 0.50    Years: 42.00    Types: Cigarettes    Quit date: 09/23/2000  . Smokeless tobacco: Never Used  . Alcohol use 0.6 oz/week    1 Standard drinks or equivalent per week     Comment: 1 glass of wine nearly daily.  . Drug use: No  . Sexual activity: Not Asked   Other Topics Concern  . None   Social History Narrative   Lives at home with her husband. Retired.      [facility]:   She is originally from [place]. She has 2 daughters, one lives  in [place] & the other in [place]. Previously worked Clinical cytogeneticist a Musician, ran a Lawyer, clerical work, Lawyer. She has no pets currently. No previous bird, mold, or hot tub exposure.       Objective:   Physical Exam BP 126/66 (BP Location: Left Arm, Cuff Size: Normal)   Pulse 81   Ht 5\' 5"  (1.651 m)   Wt 112 lb (50.8 kg)   SpO2 92%   BMI 18.64 kg/m   General:  Thin, frail elderly female. No distress. Awake.  Integument:  Warm & dry. No rash on exposed skin. No bruising on exposed skin. Lymphatics: No appreciated cervical or supraclavicular lymphadenopathy. HEENT:  Moist mucus membranes. No scleral icterus. No oral ulcers. Cardiovascular:  Regular rate. No edema. Unable to appreciate JVD.  Pulmonary: Symmetrically decreased breath sounds. Otherwise good aeration & clear with auscultation. Normal work of breathing on supplemental oxygen. Abdomen: Soft. Normal bowel sounds. Nondistended. Musculoskeletal:  Normal bulk and tone. No joint effusion appreciated.  PFT 01/22/15: FVC 1.58 L (53%) FEV1 0.58 L (26%) FEV1/FVC 0.37 FEF 25-75 0.23 L (13%) no bronchodilator response 11/05/14:FVC 1.52 L (51%) FEV1 0.49 L (22%) FEV1/FVC 0.32 FEF 25-75 0.20 L (11%) no bronchodilator response ERV 87% DLCO uncorrected 27%  01/22/15:  Walked 192 meters / Baseline Sat 93% on RA / Nadir Sat 70% on RA (maintained saturation with 3 L/m pulse at 90%)  IMAGING CXR PA/LAT 02/11/16 (Previously reviewed by me):  Resolution of previous interstitial prominence. No new focal opacity or mass appreciated. Hyperinflation with flattening of the diaphragms. Heart normal in size & mediastinum normal in contour.  CXR PA/LAT 12/05/15 (previously reviewed by me):  Increased interstitial and patchy alveolar opacities in left lung. No pleural effusion. Heart and mediastinum normal in contour.   Portable CXR 09/03/14 (previously reviewed by me): Hyperinflation with flattening of the  diaphragms. No focal opacity or mass appreciated. No pleural effusion or thickening appreciated. Heart normal in size. Mediastinum normal in contour.  CARDIAC TTE (09/04/14): LV normal in size. Moderate LVH. EF 60-65%. No regional wall motion abnormalities. LA & RA normal in size. RV normal in size and function. Pulmonary artery systolic pressure 30 mmHg. No aortic stenosis or regurgitation. Normal aortic root. Mild mitral regurgitation. No pulmonic regurgitation. Trivial tricuspid regurgitation. Trivial pericardial effusion.  MICROBIOLOGY Sputum (09/06/14): Oral flora Urine Strep Ag (09/06/14): Negative Urine Legionella Ag (09/06/14): Negative Influenza A/B PCR (09/06/14): Negative   LABS 11/06/14 Alpha-1 antitrypsin: MZ (88)  09/06/14 ABG on 3 L/m: 7.40 / 65 / 63 / 91% CBC: 11.5/14.2/46.3/240 BMP: 140/4.4/99/41/19/0.38/118/9.2 LFT: 3.7/6.3/0.5/61/20/21    Assessment & Plan:  76 y.o. female with very severe COPD, chronic hypoxic respiratory failure, chronic hypercarbic respiratory failure, & chronic allergic rhinitis.The patient's COPD  is extremely well-controlled on her current regimen. She is having some difficulty with entering the doughnut hole and affording her Spiriva. We did discuss the upcoming release of a nebulized LAMA which may be more affordable. She will call back to inquire about this in November. She is continuing to use oxygen and her allergies are reasonably controlled with Singulair. I instructed the patient to contact me if she had any new breathing problems or questions before her next appointment. The patient does have some difficulty with her home life as her husband believes that through spiritual believe she could heal herself and negate the need for her medications. Her children and grandchildren are very supportive of her situation however and aware of the severity.  1. Very severe COPD: Continuing patient on Pulmicort, Brovana, and Spiriva. Given samples of Spiriva  Respimat today. Patient to call back in November to inquire about the new nebulized LAMA that will be released.  2. Chronic hypoxic respiratory failure: Continuing on oxygen therapy as previously prescribed. No changes.  3. Chronic hypercarbic respiratory failure: Continuing to defer noninvasive positive pressure ventilation for now. Patient will compensated.  4. Chronic allergic rhinitis: Continuing Singulair. No changes.  5. Health maintenance: Status post Pneumovax March 2012, Prevnar January 2015, & Tdap June 2012. administering influenza vaccine today.  6. Follow-up: Return to clinic in 1 year or sooner if needed.   Donna Christen Jamison Neighbor, M.D. [facility] Pager:  [phone] After 3pm or if no response, call [phone] 10:27 AM 08/27/16

## 2016-08-31 ENCOUNTER — Ambulatory Visit: Payer: Self-pay | Admitting: Pulmonary Disease

## 2016-08-31 DIAGNOSIS — J449 Chronic obstructive pulmonary disease, unspecified: Secondary | ICD-10-CM | POA: Diagnosis not present

## 2016-09-07 ENCOUNTER — Telehealth: Payer: Self-pay | Admitting: Pulmonary Disease

## 2016-09-07 MED ORDER — TIOTROPIUM BROMIDE MONOHYDRATE 2.5 MCG/ACT IN AERS: 2.0000 | INHALATION_SPRAY | Freq: Every day | RESPIRATORY_TRACT | 11 refills | Status: DC

## 2016-09-07 NOTE — Telephone Encounter (Signed)
Pt requesting hard copy rx for spiriva respimat for pt assistance.  This has been printed and left up front for pickup.  Nothing further needed.

## 2016-09-09 DIAGNOSIS — J449 Chronic obstructive pulmonary disease, unspecified: Secondary | ICD-10-CM | POA: Diagnosis not present

## 2016-09-19 ENCOUNTER — Other Ambulatory Visit: Payer: Self-pay | Admitting: Internal Medicine

## 2016-09-21 ENCOUNTER — Telehealth: Payer: Self-pay | Admitting: Internal Medicine

## 2016-09-21 NOTE — Telephone Encounter (Signed)
Error

## 2016-09-28 ENCOUNTER — Ambulatory Visit (INDEPENDENT_AMBULATORY_CARE_PROVIDER_SITE_OTHER): Payer: Medicare Other | Admitting: Internal Medicine

## 2016-09-28 ENCOUNTER — Encounter: Payer: Self-pay | Admitting: Internal Medicine

## 2016-09-28 VITALS — BP 148/70 | HR 82 | Temp 98.0°F | Ht 65.0 in | Wt 113.0 lb

## 2016-09-28 DIAGNOSIS — I10 Essential (primary) hypertension: Secondary | ICD-10-CM

## 2016-09-28 DIAGNOSIS — E785 Hyperlipidemia, unspecified: Secondary | ICD-10-CM

## 2016-09-28 DIAGNOSIS — J9611 Chronic respiratory failure with hypoxia: Secondary | ICD-10-CM | POA: Diagnosis not present

## 2016-09-28 DIAGNOSIS — J449 Chronic obstructive pulmonary disease, unspecified: Secondary | ICD-10-CM | POA: Diagnosis not present

## 2016-09-28 LAB — CBC WITH DIFFERENTIAL/PLATELET
BASOS PCT: 0.4 % (ref 0.0–3.0)
Basophils Absolute: 0 10*3/uL (ref 0.0–0.1)
EOS PCT: 0.9 % (ref 0.0–5.0)
Eosinophils Absolute: 0.1 10*3/uL (ref 0.0–0.7)
HEMATOCRIT: 38.8 % (ref 36.0–46.0)
HEMOGLOBIN: 12.8 g/dL (ref 12.0–15.0)
LYMPHS PCT: 28.6 % (ref 12.0–46.0)
Lymphs Abs: 1.6 10*3/uL (ref 0.7–4.0)
MCHC: 33 g/dL (ref 30.0–36.0)
MCV: 96.2 fl (ref 78.0–100.0)
MONOS PCT: 8.1 % (ref 3.0–12.0)
Monocytes Absolute: 0.4 10*3/uL (ref 0.1–1.0)
NEUTROS ABS: 3.4 10*3/uL (ref 1.4–7.7)
Neutrophils Relative %: 62 % (ref 43.0–77.0)
Platelets: 199 10*3/uL (ref 150.0–400.0)
RBC: 4.03 Mil/uL (ref 3.87–5.11)
RDW: 12.8 % (ref 11.5–15.5)
WBC: 5.5 10*3/uL (ref 4.0–10.5)

## 2016-09-28 LAB — COMPREHENSIVE METABOLIC PANEL
ALT: 17 U/L (ref 0–35)
AST: 23 U/L (ref 0–37)
Albumin: 4.4 g/dL (ref 3.5–5.2)
Alkaline Phosphatase: 53 U/L (ref 39–117)
BUN: 14 mg/dL (ref 6–23)
CALCIUM: 9.7 mg/dL (ref 8.4–10.5)
CHLORIDE: 102 meq/L (ref 96–112)
CO2: 35 meq/L — AB (ref 19–32)
Creatinine, Ser: 0.48 mg/dL (ref 0.40–1.20)
GFR: 133.84 mL/min (ref >60.00)
Glucose, Bld: 88 mg/dL (ref 70–99)
POTASSIUM: 4.2 meq/L (ref 3.5–5.1)
Sodium: 143 mEq/L (ref 135–145)
Total Bilirubin: 0.7 mg/dL (ref 0.2–1.2)
Total Protein: 6.5 g/dL (ref 6.0–8.3)

## 2016-09-28 LAB — LIPID PANEL
CHOL/HDL RATIO: 2
Cholesterol: 183 mg/dL (ref 0–200)
HDL: 77 mg/dL (ref >39.00)
LDL CALC: 90 mg/dL (ref 0–99)
NonHDL: 105.72
TRIGLYCERIDES: 79 mg/dL (ref 0.0–149.0)
VLDL: 15.8 mg/dL (ref 0.0–40.0)

## 2016-09-28 LAB — TSH: TSH: 0.9 u[IU]/mL (ref 0.35–4.50)

## 2016-09-28 MED ORDER — MONTELUKAST SODIUM 10 MG PO TABS: 10.0000 mg | ORAL_TABLET | Freq: Every day | ORAL | 2 refills | Status: DC

## 2016-09-28 NOTE — Progress Notes (Signed)
Subjective:    Patient ID: Cathy Hensley, female    DOB: 1940-08-13, 76 y.o.   MRN: [id number]  HPI  76 year old patient who is seen today in follow-up. She is followed closely by pulmonary medicine due to advanced COPD with chronic respiratory failure.  She continues to do well on chronic supplemental oxygen therapy. She has been compliant with her pulmonary medications in spite of some issues with cost. She remained stable and does have considerable support from her daughters. She has essential hypertension which has remained stable. No new concerns or complaints.  Medications updated Pulmonary notes reviewed.  She remains remarkably active and visits [employer] 3 times per week.  Past Medical History:  Diagnosis Date  . 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 COPD 07/13/2006  . HYPERTENSION 07/13/2006  . MENOPAUSAL SYNDROME 07/13/2006  . Other and unspecified hyperlipidemia 07/13/2006  . Respiratory failure (HCC)    Chronic hypoxic     Social History   Social History  . Marital status: Married    Spouse name: N/A  . Number of children: 2  . Years of education: N/A   Occupational History  . retired    Social History Main Topics  . Smoking status: Former Smoker    Packs/day: 0.50    Years: 42.00    Types: Cigarettes    Quit date: 09/23/2000  . Smokeless tobacco: Never Used  . Alcohol use 0.6 oz/week    1 Standard drinks or equivalent per week     Comment: 1 glass of wine nearly daily.  . Drug use: No  . Sexual activity: Not on file   Other Topics Concern  . Not on file   Social History Narrative   Lives at home with her husband. Retired.      [facility]:   She is originally from [place]. She has 2 daughters, one lives in [place] & the other in [place]. Previously worked Clinical cytogeneticist a Musician, ran a Lawyer, clerical work, Lawyer. She has no pets currently. No previous bird, mold, or hot tub exposure.     Past  Surgical History:  Procedure Laterality Date  . TONSILLECTOMY      Family History  Problem Relation Age of Onset  . AAA (abdominal aortic aneurysm) Mother   . Emphysema Mother   . Aneurysm Sister        brain  . Emphysema Father   . Lymphoma Sister     No Known Allergies  Current Outpatient Prescriptions on File Prior to Visit  Medication Sig Dispense Refill  . acetaminophen (TYLENOL) 500 MG tablet Take 500 mg by mouth every 6 (six) hours as needed.    Marland Kitchen albuterol (PROVENTIL HFA;VENTOLIN HFA) 108 (90 Base) MCG/ACT inhaler Inhale 2 puffs into the lungs every 6 (six) hours as needed for wheezing or shortness of breath. 1 Inhaler 3  . amLODipine (NORVASC) 5 MG tablet take 1 tablet by mouth once daily 90 tablet 3  . arformoterol (BROVANA) 15 MCG/2ML NEBU Take 2 mLs (15 mcg total) by nebulization 2 (two) times daily. 120 mL 3  . aspirin 81 MG chewable tablet Chew 1 tablet (81 mg total) by mouth daily.    . budesonide (PULMICORT) 0.5 MG/2ML nebulizer solution Take 2 mLs (0.5 mg total) by nebulization 2 (two) times daily. 60 mL 12  . furosemide (LASIX) 20 MG tablet 1 tablet daily as needed for fluid retention 30 tablet 3  . Multiple Vitamins-Minerals (CENTRUM SILVER PO) Take  1 tablet by mouth daily.    . simvastatin (ZOCOR) 20 MG tablet TAKE 1 TABLET BY MOUTH AT BEDTIME 90 tablet 0  . Tiotropium Bromide Monohydrate (SPIRIVA RESPIMAT) 2.5 MCG/ACT AERS Inhale 2 puffs into the lungs daily. 1 Inhaler 11   No current facility-administered medications on file prior to visit.     BP (!) 148/70 (BP Location: Left Arm, Patient Position: Sitting, Cuff Size: Normal)   Pulse 82   Temp 98 F (36.7 C) (Oral)   Ht  (1.651 m)   Wt 113 lb (51.3 kg)   SpO2 (!) 84%   BMI 18.80 kg/m     Review of Systems  Constitutional: Negative.   HENT: Negative for congestion, dental problem, hearing loss, rhinorrhea, sinus pressure, sore throat and tinnitus.   Eyes: Negative for pain, discharge and  visual disturbance.  Respiratory: Positive for shortness of breath. Negative for cough.   Cardiovascular: Negative for chest pain, palpitations and leg swelling.  Gastrointestinal: Negative for abdominal distention, abdominal pain, blood in stool, constipation, diarrhea, nausea and vomiting.  Genitourinary: Negative for difficulty urinating, dysuria, flank pain, frequency, hematuria, pelvic pain, urgency, vaginal bleeding, vaginal discharge and vaginal pain.  Musculoskeletal: Negative for arthralgias, gait problem and joint swelling.  Skin: Negative for rash.  Neurological: Negative for dizziness, syncope, speech difficulty, weakness, numbness and headaches.  Hematological: Negative for adenopathy.  Psychiatric/Behavioral: Negative for agitation, behavioral problems and dysphoric mood. The patient is not nervous/anxious.        Objective:   Physical Exam  Constitutional: She is oriented to person, place, and time. She appears well-developed and well-nourished. No distress.  Blood pressure 130/74 Nasal cannula O2 in place  HENT:  Head: Normocephalic.  Right Ear: External ear normal.  Left Ear: External ear normal.  Mouth/Throat: Oropharynx is clear and moist.  Eyes: Pupils are equal, round, and reactive to light. Conjunctivae and EOM are normal.  Neck: Normal range of motion. Neck supple. No thyromegaly present.  Cardiovascular: Normal rate, regular rhythm, normal heart sounds and intact distal pulses.   Pedal pulses are full No edema  Pulmonary/Chest: Effort normal.  Diminished breath sounds .  Few scattered rhonchi  Abdominal: Soft. Bowel sounds are normal. She exhibits no mass. There is no tenderness.  Musculoskeletal: Normal range of motion. She exhibits no edema.  Lymphadenopathy:    She has no cervical adenopathy.  Neurological: She is alert and oriented to person, place, and time.  Skin: Skin is warm and dry. No rash noted.  Psychiatric: She has a normal mood and affect.  Her behavior is normal.          Assessment & Plan:   Advanced COPD/oxygen dependent Essential hypertension, stable Dyslipidemia.  Statin therapy.  Refilled  Flu vaccine administered last month Pulmonary follow-up as scheduled  Return here in one year or as needed  Check updated lab  NIKE

## 2016-09-28 NOTE — Patient Instructions (Signed)
Limit your sodium (Salt) intake    It is important that you exercise regularly, at least 20 minutes 3 to 4 times per week.  If you develop chest pain or shortness of breath seek  medical attention.  Pulmonary follow-up as scheduled  Return here in 6 months or as needed

## 2016-09-29 DIAGNOSIS — J449 Chronic obstructive pulmonary disease, unspecified: Secondary | ICD-10-CM | POA: Diagnosis not present

## 2016-10-08 ENCOUNTER — Telehealth: Payer: Self-pay | Admitting: Internal Medicine

## 2016-10-08 MED ORDER — AMLODIPINE BESYLATE 5 MG PO TABS: 5.0000 mg | ORAL_TABLET | Freq: Every day | ORAL | 0 refills | Status: DC

## 2016-10-08 NOTE — Telephone Encounter (Signed)
Pt need new Rx for amlodipine she is out of this medication as of today.  Pharm:  Insurance account manager and [employer]

## 2016-10-08 NOTE — Telephone Encounter (Signed)
Medication was refilled.

## 2016-10-09 DIAGNOSIS — J449 Chronic obstructive pulmonary disease, unspecified: Secondary | ICD-10-CM | POA: Diagnosis not present

## 2016-10-25 ENCOUNTER — Telehealth: Payer: Self-pay

## 2016-10-25 ENCOUNTER — Telehealth: Payer: Self-pay | Admitting: Internal Medicine

## 2016-10-25 MED ORDER — BUDESONIDE 0.5 MG/2ML IN SUSP: 0.5000 mg | Freq: Two times a day (BID) | RESPIRATORY_TRACT | 12 refills | Status: DC

## 2016-10-25 MED ORDER — ARFORMOTEROL TARTRATE 15 MCG/2ML IN NEBU: 15.0000 ug | INHALATION_SOLUTION | Freq: Two times a day (BID) | RESPIRATORY_TRACT | 3 refills | Status: DC

## 2016-10-25 NOTE — Telephone Encounter (Signed)
Pt states that she is on  budesonide (PULMICORT) 0.25MG /2ML nebulizer solution BID, please clarify dosage.

## 2016-10-25 NOTE — Telephone Encounter (Signed)
° ° ° ° °  budesonide (PULMICORT) 0.5 MG/2ML nebulizer solution   Pt would like a call back she said why is the dose double

## 2016-10-25 NOTE — Telephone Encounter (Signed)
Pt states that she has spoken with [facility] and they have faxed over a rx request twice last week with no response. Pt states that she needs her nebulizer medications ASAP. If we do not have fax please call and let her know.  Paty - Please follow up. Thanks!

## 2016-10-25 NOTE — Telephone Encounter (Signed)
Continue present dose Please change dosing in  patient's medicine list

## 2016-10-25 NOTE — Telephone Encounter (Signed)
Nebulizer medications  were e-scribed to :  [facility] - [place], [place] - [address] [phone] (Phone)  [phone] (Fax).    This pharmacy was also added to pt's cart.   Pt was called and made aware.

## 2016-10-26 ENCOUNTER — Telehealth: Payer: Self-pay | Admitting: Internal Medicine

## 2016-10-26 MED ORDER — BUDESONIDE 0.25 MG/2ML IN SUSP: 0.2500 mg | Freq: Two times a day (BID) | RESPIRATORY_TRACT | 12 refills | Status: DC

## 2016-10-26 NOTE — Telephone Encounter (Signed)
updated pt's chart and e-scribed corrected medication to pharmacy. Called pharmacy to cancel 0.5mg /ml dose. Pt was made aware.

## 2016-10-26 NOTE — Telephone Encounter (Signed)
° ° ° ° ° ° ° °  The below med was sent to the wrong pharmacy . It was sent to [facility] and should have been sent to [facility]    budesonide (PULMICORT) 0.25 MG/2ML nebulizer solution

## 2016-10-26 NOTE — Telephone Encounter (Signed)
Medication was resent

## 2016-10-28 DIAGNOSIS — J449 Chronic obstructive pulmonary disease, unspecified: Secondary | ICD-10-CM | POA: Diagnosis not present

## 2016-11-09 DIAGNOSIS — J449 Chronic obstructive pulmonary disease, unspecified: Secondary | ICD-10-CM | POA: Diagnosis not present

## 2016-11-11 ENCOUNTER — Telehealth: Payer: Self-pay | Admitting: Pulmonary Disease

## 2016-11-11 NOTE — Telephone Encounter (Signed)
lmomtcb x 1 for the pt 

## 2016-11-11 NOTE — Telephone Encounter (Signed)
I'm not aware this has been approved yet. We could do [employer] in the meantime. They will do the first month free on the medication. It's twice daily but in the same class as the medication that is to be released. It would replace her Spiriva. If she wants to try this please send in the following prescription but make sure to contact the drug rep:  Lonhala Magnair neb BID #60 - 1 refill.

## 2016-11-11 NOTE — Telephone Encounter (Signed)
Pt is aware of below message and voiced her understanding.  Pt states she wishes to keep medications as they are at this time.   Will route to JN as an [place].

## 2016-11-11 NOTE — Telephone Encounter (Signed)
Spoke with pt, she wanted to see about the [employer] nebulizer medication. JN is this medication available now? Please advise.

## 2016-11-29 DIAGNOSIS — J449 Chronic obstructive pulmonary disease, unspecified: Secondary | ICD-10-CM | POA: Diagnosis not present

## 2016-12-06 ENCOUNTER — Telehealth: Payer: Self-pay | Admitting: Pulmonary Disease

## 2016-12-06 ENCOUNTER — Encounter: Payer: Self-pay | Admitting: Internal Medicine

## 2016-12-06 ENCOUNTER — Ambulatory Visit (INDEPENDENT_AMBULATORY_CARE_PROVIDER_SITE_OTHER)
Admission: RE | Admit: 2016-12-06 | Discharge: 2016-12-06 | Disposition: A | Discharge status: Home / Self Care | Payer: Medicare Other | Source: Ambulatory Visit | Attending: Internal Medicine | Admitting: Internal Medicine

## 2016-12-06 ENCOUNTER — Ambulatory Visit: Payer: Medicare Other | Admitting: Internal Medicine

## 2016-12-06 VITALS — BP 126/64 | HR 77 | Temp 98.0°F | Ht 65.0 in | Wt 112.0 lb

## 2016-12-06 DIAGNOSIS — R05 Cough: Secondary | ICD-10-CM | POA: Diagnosis not present

## 2016-12-06 DIAGNOSIS — J441 Chronic obstructive pulmonary disease with (acute) exacerbation: Secondary | ICD-10-CM | POA: Diagnosis not present

## 2016-12-06 DIAGNOSIS — J9611 Chronic respiratory failure with hypoxia: Secondary | ICD-10-CM | POA: Diagnosis not present

## 2016-12-06 DIAGNOSIS — J9612 Chronic respiratory failure with hypercapnia: Secondary | ICD-10-CM | POA: Diagnosis not present

## 2016-12-06 DIAGNOSIS — J189 Pneumonia, unspecified organism: Secondary | ICD-10-CM

## 2016-12-06 DIAGNOSIS — R0602 Shortness of breath: Secondary | ICD-10-CM | POA: Diagnosis not present

## 2016-12-06 MED ORDER — PREDNISONE 10 MG PO TABS: ORAL_TABLET | ORAL | 0 refills | Status: DC

## 2016-12-06 MED ORDER — AZITHROMYCIN 250 MG PO TABS: ORAL_TABLET | ORAL | 0 refills | Status: DC

## 2016-12-06 NOTE — Progress Notes (Signed)
\\  Subjective:    Patient ID: Cathy Hensley, female    DOB: 12-17-1940, 76 y.o.   MRN: [id number]     Brief patient profile:   76 yow quit smoking  2002  severe COPD: Prescribed Pulmicort, Brovana, & Spiriva.   Chronic hypoxic respiratory failure: on  2.5 lpm home/ 4 pulsed with ambulation   Chronic hypercarbic respiratory failure: Not currently on noninvasive positive pressure ventilation. Secondary to COPD.  Chronic allergic rhinitis: Prescribed Singulair.      12/06/2016 acute extended ov/Cathy Hensley re:  GOLD IV/ 02 dep on brovana/bud/ spiriva  Chief Complaint  Patient presents with  . Acute Visit    Increased SOB and cough for the past 3 days. She states sputum is hard to produce, but when she does it's grey and green.   on 2.5 lpm home and 4lpm pulsed with baseline = MMRC3 = can't walk 100 yards even at a slow pace at a flat grade s stopping due to sob   Slept ok in recliner night before ov but usually able to lie flat in bed s am HA  Acutely worse x 72 with congested cough thick and slt grey/ green worse in am  Comfortable at rest/ no needing extra saba   No obvious day to day or daytime variability or assoc  mucus plugs or hemoptysis or cp or chest tightness, subjective wheeze or overt sinus or hb symptoms. No unusual exposure hx or h/o childhood pna/ asthma or knowledge of premature birth.  Sleeping ok in recliner on 2.5 lpm without nocturnal exacerbation  of respiratory  c/o's or need for noct saba. Also denies any obvious fluctuation of symptoms with weather or environmental changes or other aggravating or alleviating factors except as outlined above   Current Allergies, Complete Past Medical History, Past Surgical History, Family History, and Social History were reviewed in [employer] record.  ROS  The following are not active complaints unless bolded Hoarseness, sore throat, dysphagia, dental problems, itching, sneezing,  nasal congestion or discharge  of excess mucus or purulent secretions, ear ache,   fever, chills, sweats, unintended wt loss or wt gain, classically pleuritic or exertional cp,  orthopnea pnd or leg swelling, presyncope, palpitations, abdominal pain, anorexia, nausea, vomiting, diarrhea  or change in bowel habits or change in bladder habits, change in stools or change in urine, dysuria, hematuria,  rash, arthralgias, visual complaints, headache, numbness, weakness or ataxia or problems with walking or coordination,  change in mood/affect or memory.        Current Meds  Medication Sig  . acetaminophen (TYLENOL) 500 MG tablet Take 500 mg by mouth every 6 (six) hours as needed.  Marland Kitchen albuterol (PROVENTIL HFA;VENTOLIN HFA) 108 (90 Base) MCG/ACT inhaler Inhale 2 puffs into the lungs every 6 (six) hours as needed for wheezing or shortness of breath.  Marland Kitchen amLODipine (NORVASC) 5 MG tablet Take 1 tablet (5 mg total) by mouth daily.  Marland Kitchen arformoterol (BROVANA) 15 MCG/2ML NEBU Take 2 mLs (15 mcg total) by nebulization 2 (two) times daily.  Marland Kitchen aspirin 81 MG chewable tablet Chew 1 tablet (81 mg total) by mouth daily.  . budesonide (PULMICORT) 0.25 MG/2ML nebulizer solution Take 2 mLs (0.25 mg total) by nebulization 2 (two) times daily.  . furosemide (LASIX) 20 MG tablet 1 tablet daily as needed for fluid retention  . montelukast (SINGULAIR) 10 MG tablet Take 1 tablet (10 mg total) by mouth at bedtime.  . Multiple Vitamins-Minerals (CENTRUM SILVER PO)  Take 1 tablet by mouth daily.  . OXYGEN 2.5 lpm 24/7 (she uses 4lpm pulsed when on POC)  . simvastatin (ZOCOR) 20 MG tablet TAKE 1 TABLET BY MOUTH AT BEDTIME  . Tiotropium Bromide Monohydrate (SPIRIVA RESPIMAT) 2.5 MCG/ACT AERS Inhale 2 puffs into the lungs daily.               Objective:   Physical Exam  Thin chronically > acutely ill appearing pleasant wf nad at rest on 4lpm pulsed  Wt Readings from Last 3 Encounters:  12/06/16 112 lb (50.8 kg)  09/28/16 113 lb (51.3 kg)  08/27/16 112 lb  (50.8 kg)    Vital signs reviewed   HEENT: nl dentition, turbinates bilaterally, and oropharynx. Nl external ear canals without cough reflex   NECK :  without JVD/Nodes/TM/ nl carotid upstrokes bilaterally   LUNGS: no acc muscle use,  Barrel chest with distant bs no audible wheeze/ hyperresonant to percussion    CV:  RRR  no s3 or murmur or increase in P2, and no edema   ABD:  soft and nontender with nl inspiratory excursion in the supine position. No bruits or organomegaly appreciated, bowel sounds nl  MS:  Nl gait/ ext warm without deformities, calf tenderness, cyanosis  - Mild  clubbing No obvious joint restrictions   SKIN: warm and dry without lesions    NEURO:  alert, approp, nl sensorium with  no motor or cerebellar deficits apparent.       PFT 01/22/15: FVC 1.58 L (53%) FEV1 0.58 L (26%) FEV1/FVC 0.37 FEF 25-75 0.23 L (13%) no bronchodilator response 11/05/14:FVC 1.52 L (51%) FEV1 0.49 L (22%) FEV1/FVC 0.32 FEF 25-75 0.20 L (11%) no bronchodilator response ERV 87% DLCO uncorrected 27%       CARDIAC TTE (09/04/14): LV normal in size. Moderate LVH. EF 60-65%. No regional wall motion abnormalities. LA & RA normal in size. RV normal in size and function. Pulmonary artery systolic pressure 30 mmHg. No aortic stenosis or regurgitation. Normal aortic root. Mild mitral regurgitation. No pulmonic regurgitation. Trivial tricuspid regurgitation. Trivial pericardial effusion.     LABS 11/06/14 Alpha-1 antitrypsin: MZ (88)  09/06/14 ABG on 3 L/m: 7.40 / 65 / 63 / 91% CBC: 11.5/14.2/46.3/240      CXR PA and Lateral:   12/06/2016 :    I personally reviewed images and agree with radiology impression as follows:   1. Mild left mid field infiltrate consistent with pneumonia.  2. Mild basilar adult chest mild bibasilar atelectasis. Small  bilateral pleural effusions.       Assessment & Plan:

## 2016-12-06 NOTE — Telephone Encounter (Signed)
Called and spoke to pt. Pt c/o prod cough and increase in SOB. Pt is requesting an appt today. Appt made with Dr. Sherene SiresWert today at 2:45p. Pt verbalized understanding and denied any further questions or concerns at this time.

## 2016-12-06 NOTE — Patient Instructions (Addendum)
zpak   Prednisone 10 mg take  4 each am x 2 days,   2 each am x 2 days,  1 each am x 2 days and stop    Please remember to go to the  x-ray department downstairs in the basement  for your tests - we will call you with the results when they are available.      Please schedule a follow up office visit in 6 weeks, call sooner if needed  - add:  F/u with ov in one week - if worse in meantime rx levaquin 750 daily x 5 days

## 2016-12-06 NOTE — Assessment & Plan Note (Addendum)
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    To rx acutely rec zpak/ prednisone and f/u in 6 weeks to regroup re chronic rx   I had an extended discussion with the patient reviewing all relevant studies completed to date and  lasting 25 minutes of a 40 minute acute office visit  With pt new to me  Each maintenance medication was reviewed in detail including most importantly the difference between maintenance and prns and under what circumstances the prns are to be triggered using an action plan format that is not reflected in the computer generated alphabetically organized AVS.    Please see AVS for specific instructions unique to this visit that I personally wrote and verbalized to the the pt in detail and then reviewed with pt  by my nurse highlighting any  changes in therapy recommended at today's visit to their plan of care.

## 2016-12-06 NOTE — Assessment & Plan Note (Signed)
HCO3  09/28/16 = 35   As of 12/06/2016   2.5 lpm at home,  4lpm pulsed outside of home   She has already tried and failed bipap  Though somewhat paradoxic, when the lung fails to clear C02 properly and pC02 rises the lung then becomes a more efficient scavenger of C02 allowing lower work of breathing and  better C02 clearance albeit at a higher serum pC02 level - this is why pts can look a lot better than their ABG's would suggest and why it's so difficult to prognosticate endstage dz.  It's also why I strongly rec DNI status (ventilating pts down to a nl pC02 adversely affects this compensatory mechanism)   No changes needed for now

## 2016-12-07 DIAGNOSIS — J189 Pneumonia, unspecified organism: Secondary | ICD-10-CM | POA: Insufficient documentation

## 2016-12-07 NOTE — Assessment & Plan Note (Signed)
See cxr 12/06/2016 rx zpak>  F/u with ov in one week - if worse in meantime rx levaquin 750 daily x 5 days

## 2016-12-08 ENCOUNTER — Telehealth: Payer: Self-pay | Admitting: *Deleted

## 2016-12-08 NOTE — Telephone Encounter (Signed)
Spoke with pt and notified of results per [name]. Pt verbalized understanding and denied any questions. She states she is improving some  I have scheduled her for f/u with cxr for 12/08/16 at 4:30

## 2016-12-08 NOTE — Telephone Encounter (Signed)
-----   Message from Nyoka Cowden, MD sent at 12/07/2016  1:24 PM EST ----- Regarding:     ----- Message ----- From: Nyoka Cowden, MD Sent: 12/07/2016   8:41 AM To: Nyoka Cowden, MD  ? Early LLL pna  rec See cxr 12/06/2016 rx zpak>  F/u with ov by end of next week - if worse in meantime rx levaquin 750 daily x 5 days  'ok to see NP for f/u cxr

## 2016-12-09 DIAGNOSIS — J449 Chronic obstructive pulmonary disease, unspecified: Secondary | ICD-10-CM | POA: Diagnosis not present

## 2016-12-15 ENCOUNTER — Encounter: Payer: Self-pay | Admitting: Internal Medicine

## 2016-12-15 ENCOUNTER — Ambulatory Visit: Payer: Medicare Other | Admitting: Internal Medicine

## 2016-12-15 ENCOUNTER — Ambulatory Visit (INDEPENDENT_AMBULATORY_CARE_PROVIDER_SITE_OTHER)
Admission: RE | Admit: 2016-12-15 | Discharge: 2016-12-15 | Disposition: A | Discharge status: Home / Self Care | Payer: Medicare Other | Source: Ambulatory Visit | Attending: Internal Medicine | Admitting: Internal Medicine

## 2016-12-15 VITALS — BP 130/70 | HR 87 | Ht 65.0 in | Wt 108.8 lb

## 2016-12-15 DIAGNOSIS — J189 Pneumonia, unspecified organism: Secondary | ICD-10-CM

## 2016-12-15 DIAGNOSIS — J449 Chronic obstructive pulmonary disease, unspecified: Secondary | ICD-10-CM

## 2016-12-15 DIAGNOSIS — I279 Pulmonary heart disease, unspecified: Secondary | ICD-10-CM | POA: Diagnosis not present

## 2016-12-15 DIAGNOSIS — J9611 Chronic respiratory failure with hypoxia: Secondary | ICD-10-CM

## 2016-12-15 DIAGNOSIS — J9612 Chronic respiratory failure with hypercapnia: Secondary | ICD-10-CM | POA: Diagnosis not present

## 2016-12-15 MED ORDER — FLUTTER DEVI: 1.0000 | 0 refills | Status: DC | PRN

## 2016-12-15 MED ORDER — LEVOFLOXACIN 500 MG PO TABS: 500.0000 mg | ORAL_TABLET | Freq: Every day | ORAL | 0 refills | Status: DC

## 2016-12-15 NOTE — Patient Instructions (Addendum)
Hold the levaquin to see if you don't improve just mucinex up to 1200 mg every 12 hours and flutter valve   Return in Aug 2019 - call sooner if needed

## 2016-12-15 NOTE — Progress Notes (Signed)
\\  Subjective:    Patient ID: Cathy Hensley, female    DOB: September 10, 1940, 76 y.o.   MRN: [id number]     Brief patient profile:   76 yowf quit smoking  2002  severe COPD: Prescribed Pulmicort, Brovana, & Spiriva.  Chronic hypoxic respiratory failure: on  2.5 lpm home/ 4 pulsed with ambulation  Chronic hypercarbic respiratory failure: Not currently on noninvasive positive pressure ventilation. Secondary to COPD. Chronic allergic rhinitis: Prescribed Singulair.      12/06/2016 acute extended ov/Shaylea Ucci re:  GOLD IV/ 02 dep on brovana/bud/ spiriva  Chief Complaint  Patient presents with  . Acute Visit    Increased SOB and cough for the past 3 days. She states sputum is hard to produce, but when she does it's grey and green.   on 2.5 lpm home and 4lpm pulsed with baseline = MMRC3 = can't walk 100 yards even at a slow pace at a flat grade s stopping due to sob   Slept ok in recliner night before ov but usually able to lie flat in bed s am HA  Acutely worse x 72 with congested cough thick and slt grey/ green worse in am  Comfortable at rest/ no needing extra saba  rec zpak  Prednisone 10 mg take  4 each am x 2 days,   2 each am x 2 days,  1 each am x 2 days and stop  - add:  F/u with ov in one week - if worse in meantime rx levaquin 750 daily x 5 days     12/15/2016  f/u ov/Marshall Roehrich re: copd / s/p pna  Chief Complaint  Patient presents with  . Follow-up    Cxr done today to follow up on pna.  Pt states that she is some better since last visit but not back to her usual self. Does have an occ. cough which is sometimes productive, SOB with exertion. Denies any CP. DME: [facility], 4L pulse when out, 2L constant at home.   mucus is grey to green/ no fever, no cp  Doe still = MMRC3  Able to lie flat hs now   mucus is most discolored in am/ very hard to cough up / scant amts   No obvious day to day or daytime variability or assoc   mucus plugs or hemoptysis or cp or chest tightness, subjective  wheeze or overt sinus or hb symptoms. No unusual exposure hx or h/o childhood pna/ asthma or knowledge of premature birth.  Sleeping ok on 2lpm  flat without nocturnal    exacerbation  of respiratory  c/o's or need for noct saba. Also denies any obvious fluctuation of symptoms with weather or environmental changes or other aggravating or alleviating factors except as outlined above   Current Allergies, Complete Past Medical History, Past Surgical History, Family History, and Social History were reviewed in [employer] record.  ROS  The following are not active complaints unless bolded Hoarseness, sore throat, dysphagia, dental problems, itching, sneezing,  nasal congestion or discharge of excess mucus or purulent secretions, ear ache,   fever, chills, sweats, unintended wt loss or wt gain, classically pleuritic or exertional cp,  orthopnea pnd or leg swelling, presyncope, palpitations, abdominal pain, anorexia, nausea, vomiting, diarrhea  or change in bowel habits or change in bladder habits, change in stools or change in urine, dysuria, hematuria,  rash, arthralgias, visual complaints, headache, numbness, weakness or ataxia or problems with walking or coordination,  change in mood/affect or  memory.        Current Meds  Medication Sig  . acetaminophen (TYLENOL) 500 MG tablet Take 500 mg by mouth every 6 (six) hours as needed.  Marland Kitchen. albuterol (PROVENTIL HFA;VENTOLIN HFA) 108 (90 Base) MCG/ACT inhaler Inhale 2 puffs into the lungs every 6 (six) hours as needed for wheezing or shortness of breath.  Marland Kitchen. amLODipine (NORVASC) 5 MG tablet Take 1 tablet (5 mg total) by mouth daily.  Marland Kitchen. arformoterol (BROVANA) 15 MCG/2ML NEBU Take 2 mLs (15 mcg total) by nebulization 2 (two) times daily.  Marland Kitchen. aspirin 81 MG chewable tablet Chew 1 tablet (81 mg total) by mouth daily.  . budesonide (PULMICORT) 0.25 MG/2ML nebulizer solution Take 2 mLs (0.25 mg total) by nebulization 2 (two) times daily.  .  furosemide (LASIX) 20 MG tablet 1 tablet daily as needed for fluid retention  . montelukast (SINGULAIR) 10 MG tablet Take 1 tablet (10 mg total) by mouth at bedtime.  . Multiple Vitamins-Minerals (CENTRUM SILVER PO) Take 1 tablet by mouth daily.  . OXYGEN 2.5 lpm 24/7 (she uses 4lpm pulsed when on POC)  . simvastatin (ZOCOR) 20 MG tablet TAKE 1 TABLET BY MOUTH AT BEDTIME  . Tiotropium Bromide Monohydrate (SPIRIVA RESPIMAT) 2.5 MCG/ACT AERS Inhale 2 puffs into the lungs daily.                Objective:   Physical Exam   amb chronically ill thin wf nad    12/15/2016    108   12/06/16 112 lb (50.8 kg)  09/28/16 113 lb (51.3 kg)  08/27/16 112 lb (50.8 kg)    Vital signs reviewed - Note on arrival 02 sats  90% on 4lpm pulsed           HEENT: nl   turbinates bilaterally, and oropharynx. Nl external ear canals without cough reflex - upper dentures    NECK :  without JVD/Nodes/TM/ nl carotid upstrokes bilaterally   LUNGS: no acc muscle use,  Barrel contour chest, distant bs s wheeze/ hyper resonant to percussion, no bronchial changes    CV:  RRR  no s3 or murmur or increase in P2, and no edema   ABD:  soft and nontender with nl inspiratory excursion in the supine position. No bruits or organomegaly appreciated, bowel sounds nl  MS:  Nl gait/ ext warm without deformities, calf tenderness, cyanosis - mild clubbing No obvious joint restrictions   SKIN: warm and dry without lesions    NEURO:  alert, approp, nl sensorium with  no motor or cerebellar deficits apparent.      PFT 01/22/15: FVC 1.58 L (53%) FEV1 0.58 L (26%) FEV1/FVC 0.37 FEF 25-75 0.23 L (13%) no bronchodilator response 11/05/14:FVC 1.52 L (51%) FEV1 0.49 L (22%) FEV1/FVC 0.32 FEF 25-75 0.20 L (11%) no bronchodilator response ERV 87% DLCO uncorrected 27%       CARDIAC TTE (09/04/14): LV normal in size. Moderate LVH. EF 60-65%. No regional wall motion abnormalities. LA & RA normal in size. RV normal in size  and function. Pulmonary artery systolic pressure 30 mmHg. No aortic stenosis or regurgitation. Normal aortic root. Mild mitral regurgitation. No pulmonic regurgitation. Trivial tricuspid regurgitation. Trivial pericardial effusion.     LABS 11/06/14 Alpha-1 antitrypsin: MZ (88)  09/06/14 ABG on 3 L/m: 7.40 / 65 / 63 / 91% CBC: 11.5/14.2/46.3/240      CXR PA and Lateral:   12/15/2016 :    I personally reviewed images and agree with radiology impression as  follows:   Improved left mid lung infiltrate.       Assessment & Plan:

## 2016-12-16 ENCOUNTER — Other Ambulatory Visit: Payer: Self-pay | Admitting: Internal Medicine

## 2016-12-16 ENCOUNTER — Encounter: Payer: Self-pay | Admitting: Internal Medicine

## 2016-12-16 NOTE — Assessment & Plan Note (Signed)
HCO3  09/28/16 = 35  C/w pC02 in high 50's   As of 12/15/2016   2 lpm at home,  4lpm pulsed outside of home

## 2016-12-16 NOTE — Assessment & Plan Note (Signed)
See cxr 12/06/2016 rx zpak>  F/u 12/15/2016 improved   All symptoms better also, given levaquin as planning to travel and possibility this did not completely clear with zpak and advised to take 500mg  x 7 days for any worsening of cough/ congestion/ color of sputum'  Also trained on flutter valve use today.

## 2016-12-16 NOTE — Assessment & Plan Note (Signed)
  PFT 01/22/15: FVC 1.58 L (53%) FEV1 0.58 L (26%) FEV1/FVC 0.37 FEF 25-75 0.23 L (13%) no bronchodilator response 11/05/14:FVC 1.52 L (51%) FEV1 0.49 L (22%) FEV1/FVC 0.32 FEF 25-75 0.20 L (11%) no bronchodilator response ERV 87% DLCO uncorrected 27%  - trained on flutter use 12/15/2016   Very severe dz/  Group D in terms of symptom/risk and laba/lama/ICS  therefore appropriate rx at this point/ reviewed

## 2016-12-21 ENCOUNTER — Telehealth: Payer: Self-pay | Admitting: Internal Medicine

## 2016-12-21 NOTE — Telephone Encounter (Signed)
Spoke with pt, advised I left samples of Spiriva up front. Pt understood and will come pick them up today. Nothing further is needed.

## 2017-01-03 ENCOUNTER — Other Ambulatory Visit: Payer: Self-pay | Admitting: Internal Medicine

## 2017-01-05 DIAGNOSIS — J449 Chronic obstructive pulmonary disease, unspecified: Secondary | ICD-10-CM | POA: Diagnosis not present

## 2017-01-09 DIAGNOSIS — J449 Chronic obstructive pulmonary disease, unspecified: Secondary | ICD-10-CM | POA: Diagnosis not present

## 2017-01-17 ENCOUNTER — Ambulatory Visit: Payer: Medicare Other | Admitting: Internal Medicine

## 2017-02-02 DIAGNOSIS — J449 Chronic obstructive pulmonary disease, unspecified: Secondary | ICD-10-CM | POA: Diagnosis not present

## 2017-02-09 DIAGNOSIS — J449 Chronic obstructive pulmonary disease, unspecified: Secondary | ICD-10-CM | POA: Diagnosis not present

## 2017-02-19 IMAGING — DX DG CHEST 2V
2 series · 2 of 2 positions shown · non-contrast
Comparison: Chest x-ray of [DATE] [DATE], [DATE], [DATE] [DATE], [DATE] common
July 28, 2006.

CLINICAL DATA: Follow-up of pneumonia.  No current complaints.

EXAM:
CHEST  2 VIEW

[chest pa]
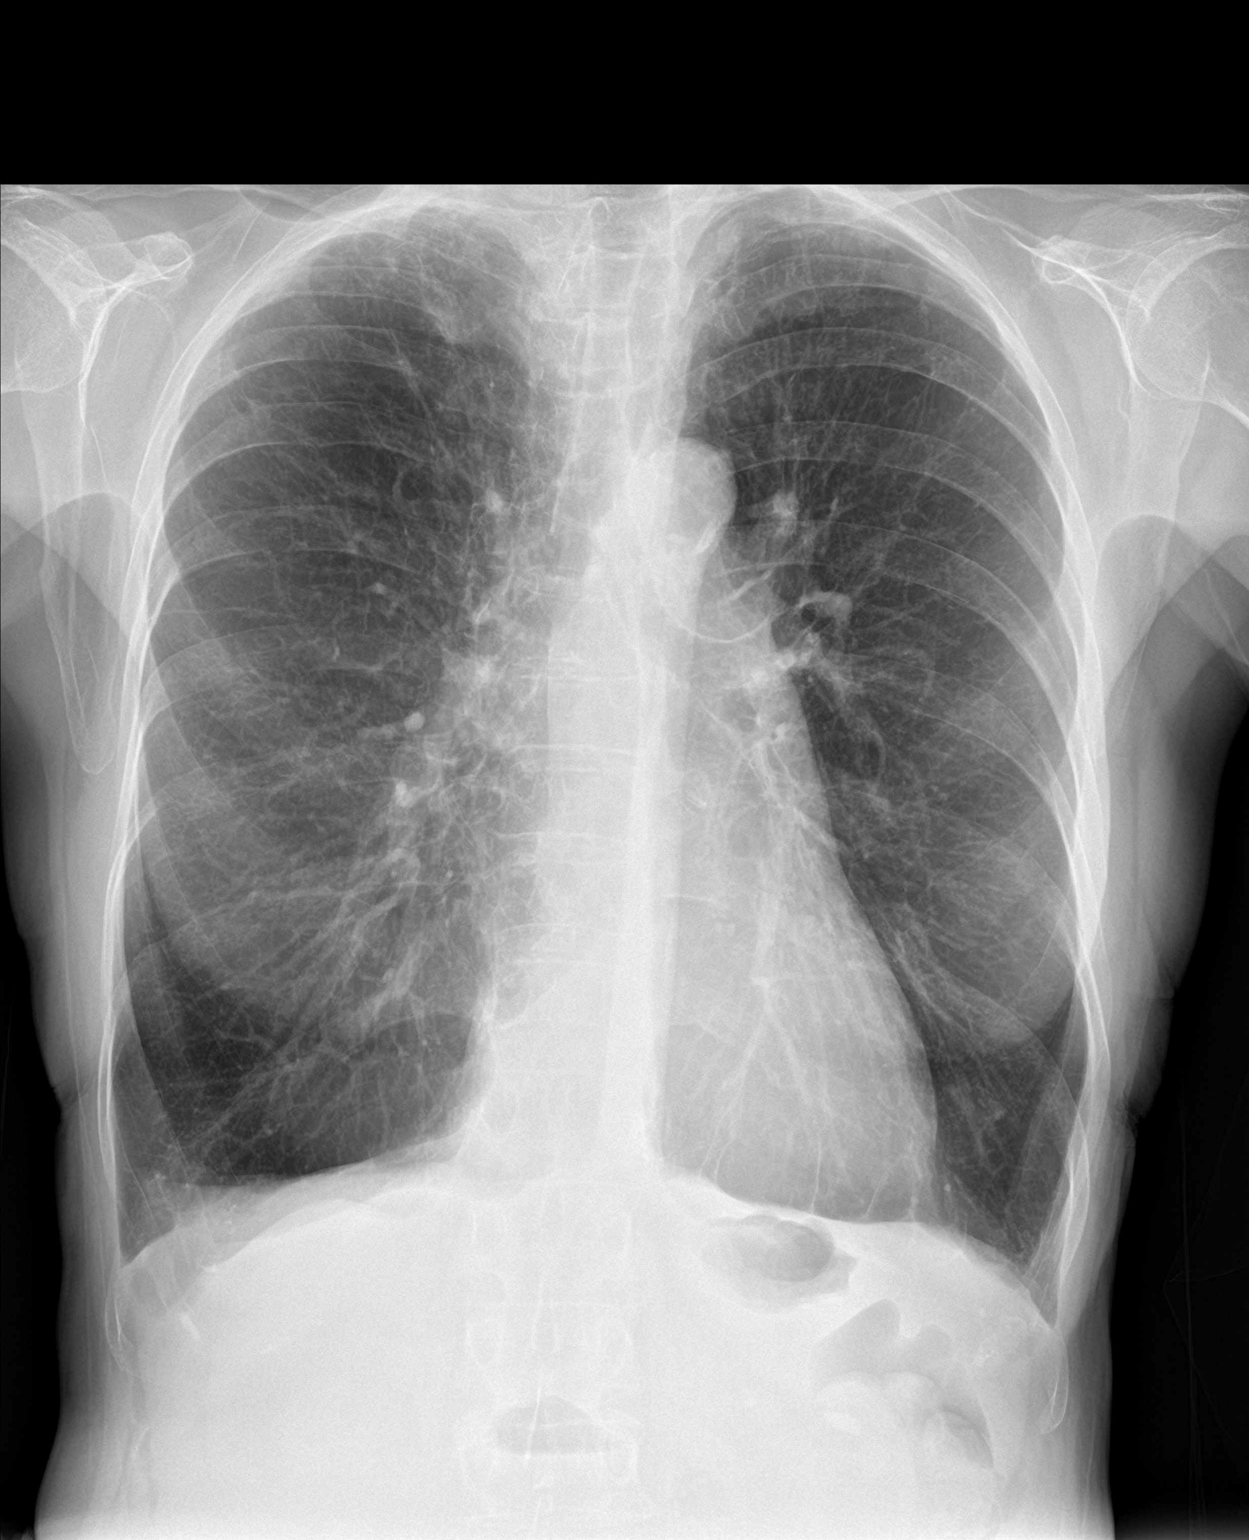

[chest lat]
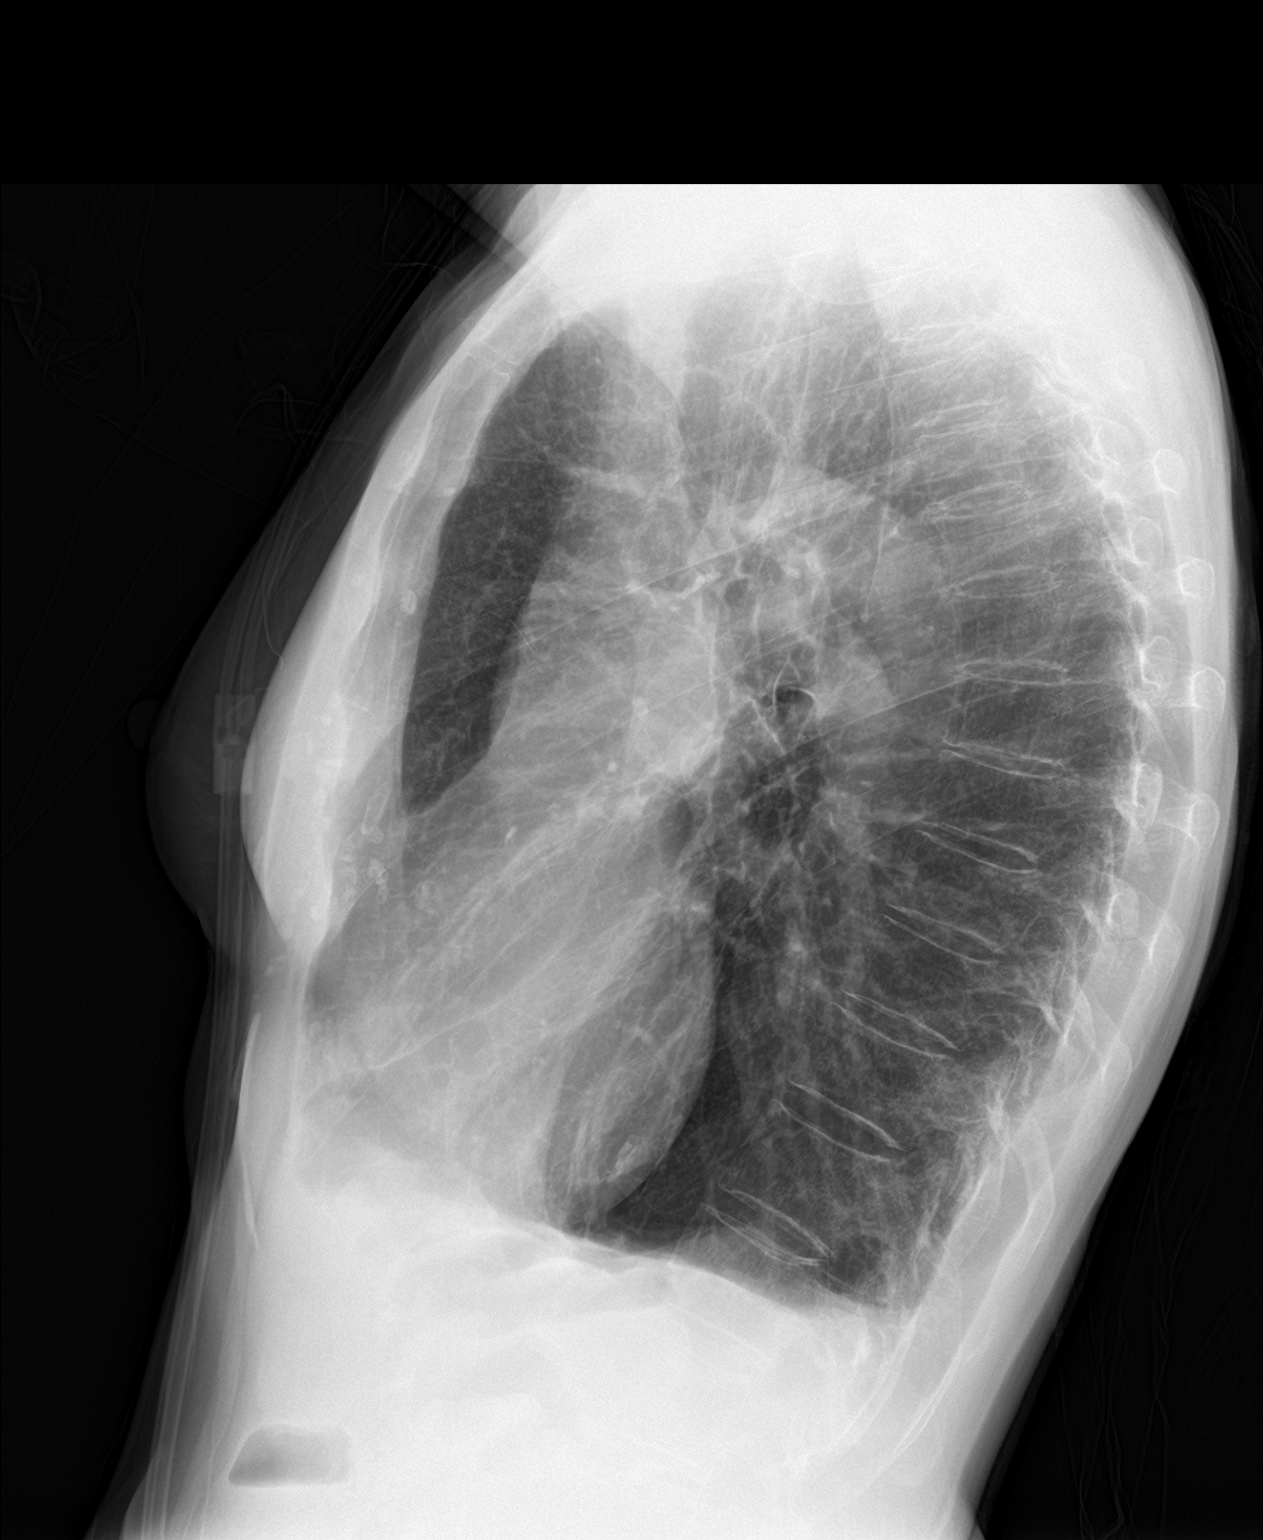

[2 of 2 positions shown; findings below may reference images not displayed]

FINDINGS: The lungs remain hyperinflated with hemidiaphragm flattening. There
is no focal infiltrate. There is no pleural effusion. The
interstitial markings have become less conspicuous overall. There is
mild stable scarring in the right upper lobe. The heart and
pulmonary vascularity are normal. The mediastinum is normal in
width. There is calcification in the wall of the aortic arch. The
bony thorax exhibits no acute abnormality.
IMPRESSION: COPD. No pneumonia, CHF, nor other acute cardiopulmonary
abnormality.

Thoracic aortic atherosclerosis.

## 2017-03-01 ENCOUNTER — Telehealth: Payer: Self-pay | Admitting: Internal Medicine

## 2017-03-01 MED ORDER — ALBUTEROL SULFATE HFA 108 (90 BASE) MCG/ACT IN AERS: 2.0000 | INHALATION_SPRAY | Freq: Four times a day (QID) | RESPIRATORY_TRACT | 3 refills | Status: DC | PRN

## 2017-03-01 NOTE — Telephone Encounter (Signed)
Called pt letting her know I sent a refill of proair inhaler to her pharmacy.  Pt expressed understanding.  Nothing further needed at this current time.

## 2017-03-04 DIAGNOSIS — J449 Chronic obstructive pulmonary disease, unspecified: Secondary | ICD-10-CM | POA: Diagnosis not present

## 2017-03-09 DIAGNOSIS — J449 Chronic obstructive pulmonary disease, unspecified: Secondary | ICD-10-CM | POA: Diagnosis not present

## 2017-03-16 ENCOUNTER — Other Ambulatory Visit: Payer: Self-pay | Admitting: Internal Medicine

## 2017-03-29 DIAGNOSIS — J449 Chronic obstructive pulmonary disease, unspecified: Secondary | ICD-10-CM | POA: Diagnosis not present

## 2017-04-09 DIAGNOSIS — J449 Chronic obstructive pulmonary disease, unspecified: Secondary | ICD-10-CM | POA: Diagnosis not present

## 2017-04-27 DIAGNOSIS — J449 Chronic obstructive pulmonary disease, unspecified: Secondary | ICD-10-CM | POA: Diagnosis not present

## 2017-05-09 DIAGNOSIS — J449 Chronic obstructive pulmonary disease, unspecified: Secondary | ICD-10-CM | POA: Diagnosis not present

## 2017-05-26 DIAGNOSIS — J449 Chronic obstructive pulmonary disease, unspecified: Secondary | ICD-10-CM | POA: Diagnosis not present

## 2017-06-08 ENCOUNTER — Ambulatory Visit (INDEPENDENT_AMBULATORY_CARE_PROVIDER_SITE_OTHER): Payer: Medicare Other | Admitting: Internal Medicine

## 2017-06-08 ENCOUNTER — Encounter: Payer: Self-pay | Admitting: Internal Medicine

## 2017-06-08 VITALS — BP 128/70 | HR 78 | Temp 98.0°F | Wt 112.0 lb

## 2017-06-08 DIAGNOSIS — J9611 Chronic respiratory failure with hypoxia: Secondary | ICD-10-CM

## 2017-06-08 DIAGNOSIS — J9612 Chronic respiratory failure with hypercapnia: Secondary | ICD-10-CM

## 2017-06-08 DIAGNOSIS — I10 Essential (primary) hypertension: Secondary | ICD-10-CM

## 2017-06-08 DIAGNOSIS — J449 Chronic obstructive pulmonary disease, unspecified: Secondary | ICD-10-CM

## 2017-06-08 NOTE — Patient Instructions (Signed)
Return in 5 months to establish with a new provider  Pulmonary follow-up as scheduled

## 2017-06-08 NOTE — Progress Notes (Signed)
Subjective:    Patient ID: Cathy Hensley, female    DOB: 14-Jun-1940, 77 y.o.   MRN: [id number]  HPI  77 year old patient who is followed by pulmonary medicine with a history of COPD Gold stage IV.  She has chronic respiratory failure on chronic O2.  She has dyslipidemia and a history of essential hypertension. Doing fairly well.  Basically is here today to discuss transfer to a new PCP  Past Medical History:  Diagnosis Date  . 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 COPD 07/13/2006  . HYPERTENSION 07/13/2006  . MENOPAUSAL SYNDROME 07/13/2006  . Other and unspecified hyperlipidemia 07/13/2006  . Respiratory failure (HCC)    Chronic hypoxic     Social History   Socioeconomic History  . Marital status: Married    Spouse name: Not on file  . Number of children: 2  . Years of education: Not on file  . Highest education level: Not on file  Occupational History  . Occupation: retired  Engineer, production  . Financial resource strain: Not on file  . Food insecurity:    Worry: Not on file    Inability: Not on file  . Transportation needs:    Medical: Not on file    Non-medical: Not on file  Tobacco Use  . Smoking status: Former Smoker    Packs/day: 0.50    Years: 42.00    Pack years: 21.00    Types: Cigarettes    Last attempt to quit: 09/23/2000    Years since quitting: 16.7  . Smokeless tobacco: Never Used  Substance and Sexual Activity  . Alcohol use: Yes    Alcohol/week: 0.6 oz    Types: 1 Standard drinks or equivalent per week    Comment: 1 glass of wine nearly daily.  . Drug use: No  . Sexual activity: Not on file  Lifestyle  . Physical activity:    Days per week: Not on file    Minutes per session: Not on file  . Stress: Not on file  Relationships  . Social connections:    Talks on phone: Not on file    Gets together: Not on file    Attends religious service: Not on file    Active member of club or organization: Not on file    Attends meetings of clubs or  organizations: Not on file    Relationship status: Not on file  . Intimate partner violence:    Fear of current or ex partner: Not on file    Emotionally abused: Not on file    Physically abused: Not on file    Forced sexual activity: Not on file  Other Topics Concern  . Not on file  Social History Narrative   Lives at home with her husband. Retired.      [facility]:   She is originally from [place]. She has 2 daughters, one lives in [place] & the other in [place]. Previously worked Clinical cytogeneticist a Musician, ran a Lawyer, clerical work, Lawyer. She has no pets currently. No previous bird, mold, or hot tub exposure.     Past Surgical History:  Procedure Laterality Date  . TONSILLECTOMY      Family History  Problem Relation Age of Onset  . AAA (abdominal aortic aneurysm) Mother   . Emphysema Mother   . Aneurysm Sister        brain  . Emphysema Father   . Lymphoma Sister     No Known Allergies  Current Outpatient Medications on File Prior to Visit  Medication Sig Dispense Refill  . acetaminophen (TYLENOL) 500 MG tablet Take 500 mg by mouth every 6 (six) hours as needed.    Marland Kitchen albuterol (PROVENTIL HFA;VENTOLIN HFA) 108 (90 Base) MCG/ACT inhaler Inhale 2 puffs into the lungs every 6 (six) hours as needed for wheezing or shortness of breath. 1 Inhaler 3  . amLODipine (NORVASC) 5 MG tablet TAKE 1 TABLET BY MOUTH DAILY 90 tablet 1  . arformoterol (BROVANA) 15 MCG/2ML NEBU Take 2 mLs (15 mcg total) by nebulization 2 (two) times daily. 120 mL 3  . aspirin 81 MG chewable tablet Chew 1 tablet (81 mg total) by mouth daily.    . budesonide (PULMICORT) 0.25 MG/2ML nebulizer solution Take 2 mLs (0.25 mg total) by nebulization 2 (two) times daily. 120 mL 12  . montelukast (SINGULAIR) 10 MG tablet Take 1 tablet (10 mg total) by mouth at bedtime. 90 tablet 2  . Multiple Vitamins-Minerals (CENTRUM SILVER PO) Take 1 tablet by mouth daily.    . OXYGEN 2.5 lpm 24/7  (she uses 4lpm pulsed when on POC)    . Respiratory Therapy Supplies (FLUTTER) DEVI 1 Device by Does not apply route as needed. 1 each 0  . simvastatin (ZOCOR) 20 MG tablet TAKE 1 TABLET BY MOUTH AT BEDTIME 90 tablet 2  . Tiotropium Bromide Monohydrate (SPIRIVA RESPIMAT) 2.5 MCG/ACT AERS Inhale 2 puffs into the lungs daily. 1 Inhaler 11   No current facility-administered medications on file prior to visit.     BP 128/70 (BP Location: Right Arm, Patient Position: Sitting, Cuff Size: Normal)   Pulse 78   Temp 98 F (36.7 C) (Oral)   Wt 112 lb (50.8 kg)   SpO2 95%   BMI 18.64 kg/m     Review of Systems  Constitutional: Negative.   HENT: Negative for congestion, dental problem, hearing loss, rhinorrhea, sinus pressure, sore throat and tinnitus.   Eyes: Negative for pain, discharge and visual disturbance.  Respiratory: Positive for shortness of breath. Negative for cough.   Cardiovascular: Negative for chest pain, palpitations and leg swelling.  Gastrointestinal: Negative for abdominal distention, abdominal pain, blood in stool, constipation, diarrhea, nausea and vomiting.  Genitourinary: Negative for difficulty urinating, dysuria, flank pain, frequency, hematuria, pelvic pain, urgency, vaginal bleeding, vaginal discharge and vaginal pain.  Musculoskeletal: Negative for arthralgias, gait problem and joint swelling.  Skin: Negative for rash.  Neurological: Negative for dizziness, syncope, speech difficulty, weakness, numbness and headaches.  Hematological: Negative for adenopathy.  Psychiatric/Behavioral: Negative for agitation, behavioral problems and dysphoric mood. The patient is not nervous/anxious.        Objective:   Physical Exam  Constitutional: She is oriented to person, place, and time. She appears well-developed and well-nourished. No distress.  Thin frail no distress.  Chronic O2 in place  HENT:  Head: Normocephalic.  Right Ear: External ear normal.  Left Ear: External  ear normal.  Mouth/Throat: Oropharynx is clear and moist.  Eyes: Pupils are equal, round, and reactive to light. Conjunctivae and EOM are normal.  Neck: Normal range of motion. Neck supple. No thyromegaly present.  Cardiovascular: Normal rate, regular rhythm, normal heart sounds and intact distal pulses.  Pulmonary/Chest: Effort normal.  Kyphosis Diminished breath sounds but clear  Abdominal: Soft. Bowel sounds are normal. She exhibits no mass. There is no tenderness.  Musculoskeletal: Normal range of motion. She exhibits no edema.  Lymphadenopathy:    She has no cervical adenopathy.  Neurological: She  is alert and oriented to person, place, and time.  Skin: Skin is warm and dry. No rash noted.  Psychiatric: She has a normal mood and affect. Her behavior is normal.          Assessment & Plan:   Advanced COPD Essential hypertension stable  Medications updated Patient return in 5 months for follow-up and to establish with a new PCP Pulmonary follow-up as scheduled  [name]

## 2017-06-09 DIAGNOSIS — J449 Chronic obstructive pulmonary disease, unspecified: Secondary | ICD-10-CM | POA: Diagnosis not present

## 2017-06-27 ENCOUNTER — Other Ambulatory Visit: Payer: Self-pay | Admitting: Internal Medicine

## 2017-06-28 ENCOUNTER — Other Ambulatory Visit: Payer: Self-pay | Admitting: Internal Medicine

## 2017-06-28 DIAGNOSIS — J449 Chronic obstructive pulmonary disease, unspecified: Secondary | ICD-10-CM | POA: Diagnosis not present

## 2017-06-29 NOTE — Telephone Encounter (Signed)
Sent to the pharmacy by e-scribe for 6 months (90 day supply).  Last seen 06/08/17 and advised to follow up with new PCP in 5 months.

## 2017-06-29 NOTE — Telephone Encounter (Signed)
Sent to the pharmacy by e-scribe for 6 months.  Seen on 06/08/17 and advised to follow up in 5 months with new PCP.

## 2017-07-09 DIAGNOSIS — J449 Chronic obstructive pulmonary disease, unspecified: Secondary | ICD-10-CM | POA: Diagnosis not present

## 2017-07-18 ENCOUNTER — Telehealth: Payer: Self-pay | Admitting: Internal Medicine

## 2017-07-18 NOTE — Telephone Encounter (Signed)
Called and spoke with patient, advised that we are placing samples up front. Nothing further needed.

## 2017-07-27 DIAGNOSIS — J449 Chronic obstructive pulmonary disease, unspecified: Secondary | ICD-10-CM | POA: Diagnosis not present

## 2017-08-09 DIAGNOSIS — J449 Chronic obstructive pulmonary disease, unspecified: Secondary | ICD-10-CM | POA: Diagnosis not present

## 2017-08-26 DIAGNOSIS — J449 Chronic obstructive pulmonary disease, unspecified: Secondary | ICD-10-CM | POA: Diagnosis not present

## 2017-09-06 ENCOUNTER — Encounter: Payer: Self-pay | Admitting: Internal Medicine

## 2017-09-06 ENCOUNTER — Ambulatory Visit: Payer: Medicare Other | Admitting: Internal Medicine

## 2017-09-06 VITALS — BP 128/66 | HR 77 | Ht 63.5 in | Wt 109.0 lb

## 2017-09-06 DIAGNOSIS — J9611 Chronic respiratory failure with hypoxia: Secondary | ICD-10-CM

## 2017-09-06 DIAGNOSIS — Z23 Encounter for immunization: Secondary | ICD-10-CM | POA: Diagnosis not present

## 2017-09-06 DIAGNOSIS — J9612 Chronic respiratory failure with hypercapnia: Secondary | ICD-10-CM | POA: Diagnosis not present

## 2017-09-06 DIAGNOSIS — J449 Chronic obstructive pulmonary disease, unspecified: Secondary | ICD-10-CM | POA: Diagnosis not present

## 2017-09-06 MED ORDER — PREDNISONE 10 MG PO TABS: ORAL_TABLET | ORAL | 0 refills | Status: DC

## 2017-09-06 MED ORDER — TIOTROPIUM BROMIDE MONOHYDRATE 2.5 MCG/ACT IN AERS: 2.0000 | INHALATION_SPRAY | Freq: Every day | RESPIRATORY_TRACT | 0 refills | Status: DC

## 2017-09-06 NOTE — Progress Notes (Signed)
\\  Subjective:   Patient ID: Cathy Hensley, female    DOB: [birth year], 77 y.o.   MRN: [id number]     Brief patient profile:  77 yowf quit smoking  2002  severe COPD: Prescribed Pulmicort, Brovana, & Spiriva.  Chronic hypoxic respiratory failure: on  2.5 lpm home/ 4 pulsed with ambulation  Chronic hypercarbic respiratory failure: Not currently on noninvasive positive pressure ventilation. Secondary to COPD. Chronic allergic rhinitis: Prescribed Singulair.      12/06/2016 acute extended ov/Cathy Hensley re:  GOLD IV/ 02 dep on brovana/bud/ spiriva  Chief Complaint  Patient presents with  . Acute Visit    Increased SOB and cough for the past 3 days. She states sputum is hard to produce, but when she does it's grey and green.   on 2.5 lpm home and 4lpm pulsed with baseline = MMRC3 = can't walk 100 yards even at a slow pace at a flat grade s stopping due to sob   Slept ok in recliner night before ov but usually able to lie flat in bed s am HA  Acutely worse x 72 with congested cough thick and slt grey/ green worse in am  Comfortable at rest/ no needing extra saba  rec zpak  Prednisone 10 mg take  4 each am x 2 days,   2 each am x 2 days,  1 each am x 2 days and stop  - add:  F/u with ov in one week - if worse in meantime rx levaquin 750 daily x 5 days     12/15/2016  f/u ov/Cathy Hensley re: copd / s/p pna  Chief Complaint  Patient presents with  . Follow-up    Cxr done today to follow up on pna.  Pt states that she is some better since last visit but not back to her usual self. Does have an occ. cough which is sometimes productive, SOB with exertion. Denies any CP. DME: [facility], 4L pulse when out, 2L constant at home.   mucus is grey to green/ no fever, no cp  Doe still = MMRC3  Able to lie flat hs now   mucus is most discolored in am/ very hard to cough up / scant amts  rec Hold the levaquin to see if you don't improve just mucinex up to 1200 mg every 12 hours and flutter valve Return in Aug  2019 - call sooner if needed     09/06/2017  f/u ov/Cathy Hensley re: GOLD IV  copd/ 02 dep resp failure/ hypercarbic/ poor hfa / smi  Chief Complaint  Patient presents with  . Follow-up    Increased SOB over the past few wks "allergies- it happens every year".  She has occ cough with green sputum.  She rarely uses her albuterol inhaler.   Dyspnea:  MMRC2 = can't walk a nl pace on a flat grade s sob but does fine slow and flat eg [employer] walking  Cough: is worse x sev weeks  Sleeping: 2.5 lpm x 2 pillows SABA use: rare 02: 2.5 at home, 4lpm poc    No obvious day to day or daytime variability or assoc  mucus plugs or hemoptysis or cp or chest tightness, subjective wheeze or overt sinus or hb symptoms.   Sleeping as above  without nocturnal  or early am exacerbation  of respiratory  c/o's or need for noct saba. Also denies any obvious fluctuation of symptoms with weather or environmental changes or other aggravating or alleviating factors except as outlined above  No unusual exposure hx or h/o childhood pna/ asthma or knowledge of premature birth.  Current Allergies, Complete Past Medical History, Past Surgical History, Family History, and Social History were reviewed in [employer] record.  ROS  The following are not active complaints unless bolded Hoarseness, sore throat, dysphagia, dental problems, itching, sneezing,  nasal congestion or discharge of excess mucus or purulent secretions, ear ache,   fever, chills, sweats, unintended wt loss or wt gain, classically pleuritic or exertional cp,  orthopnea pnd or arm/hand swelling  or leg swelling, presyncope, palpitations, abdominal pain, anorexia, nausea, vomiting, diarrhea  or change in bowel habits or change in bladder habits, change in stools or change in urine, dysuria, hematuria,  rash, arthralgias, visual complaints, headache, numbness, weakness or ataxia or problems with walking or coordination,  change in mood or   memory.        Current Meds  Medication Sig  . acetaminophen (TYLENOL) 500 MG tablet Take 500 mg by mouth every 6 (six) hours as needed.  Marland Kitchen albuterol (PROVENTIL HFA;VENTOLIN HFA) 108 (90 Base) MCG/ACT inhaler Inhale 2 puffs into the lungs every 6 (six) hours as needed for wheezing or shortness of breath.  Marland Kitchen amLODipine (NORVASC) 5 MG tablet TAKE 1 TABLET BY MOUTH DAILY  . arformoterol (BROVANA) 15 MCG/2ML NEBU Take 2 mLs (15 mcg total) by nebulization 2 (two) times daily.  Marland Kitchen aspirin 81 MG chewable tablet Chew 1 tablet (81 mg total) by mouth daily.  . budesonide (PULMICORT) 0.25 MG/2ML nebulizer solution Take 2 mLs (0.25 mg total) by nebulization 2 (two) times daily.  . montelukast (SINGULAIR) 10 MG tablet TAKE 1 TABLET BY MOUTH AT BEDTIME  . Multiple Vitamins-Minerals (CENTRUM SILVER PO) Take 1 tablet by mouth daily.  . OXYGEN 2.5 lpm 24/7 (she uses 4lpm pulsed when on POC)  . Respiratory Therapy Supplies (FLUTTER) DEVI 1 Device by Does not apply route as needed.  . simvastatin (ZOCOR) 20 MG tablet TAKE 1 TABLET BY MOUTH AT BEDTIME  . Tiotropium Bromide Monohydrate (SPIRIVA RESPIMAT) 2.5 MCG/ACT AERS Inhale 2 puffs into the lungs daily.  .                                      Objective:   Physical Exam  amb wf nad   09/06/2017         109   12/15/2016    108   12/06/16 112 lb (50.8 kg)  09/28/16 113 lb (51.3 kg)  08/27/16 112 lb (50.8 kg)      Vital signs reviewed - Note on arrival 02 sats  93% on 4lpm         HEENT: nl   turbinates bilaterally, and oropharynx. Nl external ear canals without cough reflex - upper dentures     HEENT: nl  oropharynx. Nl external ear canals without cough reflex -  Mild bilateral non-specific turbinate edema  / upper dentures    NECK :  without JVD/Nodes/TM/ nl carotid upstrokes bilaterally   LUNGS: no acc muscle use,  Mild barrel  contour chest wall with bilateral  Distant bs s audible wheeze and  without cough on insp or exp maneuver  and mild  Hyperresonant  to  percussion bilaterally     CV:  RRR  no s3 or murmur or increase in P2, and no edema   ABD:  soft and nontender with pos mid insp  Hoover's  in the supine position. No bruits or organomegaly appreciated, bowel sounds nl  MS:   Nl gait/  ext warm without deformities, calf tenderness, cyanosis - mild clubbing No obvious joint restrictions   SKIN: warm and dry without lesions    NEURO:  alert, approp, nl sensorium with  no motor or cerebellar deficits apparent.        PFT 01/22/15: FVC 1.58 L (53%) FEV1 0.58 L (26%) FEV1/FVC 0.37 FEF 25-75 0.23 L (13%) no bronchodilator response 11/05/14:FVC 1.52 L (51%) FEV1 0.49 L (22%) FEV1/FVC 0.32 FEF 25-75 0.20 L (11%) no bronchodilator response ERV 87% DLCO uncorrected 27%       CARDIAC TTE (09/04/14): LV normal in size. Moderate LVH. EF 60-65%. No regional wall motion abnormalities. LA & RA normal in size. RV normal in size and function. Pulmonary artery systolic pressure 30 mmHg. No aortic stenosis or regurgitation. Normal aortic root. Mild mitral regurgitation. No pulmonic regurgitation. Trivial tricuspid regurgitation. Trivial pericardial effusion.     LABS 11/06/14 Alpha-1 antitrypsin: MZ (88)  09/06/14 ABG on 3 L/m: 7.40 / 65 / 63 / 91% CBC: 11.5/14.2/46.3/240            Assessment & Plan:

## 2017-09-06 NOTE — Patient Instructions (Addendum)
If allergies/ cough / breathing get worse >  Prednisone 10 mg take  4 each am x 2 days,   2 each am x 2 days,  1 each am x 2 days and stop   Flu shot today   Please schedule a follow up visit in 6  months but call sooner if needed

## 2017-09-07 ENCOUNTER — Encounter: Payer: Self-pay | Admitting: Internal Medicine

## 2017-09-07 NOTE — Assessment & Plan Note (Addendum)
PFT 01/22/15: FVC 1.58 L (53%) FEV1 0.58 L (26%) FEV1/FVC 0.37 FEF 25-75 0.23 L (13%) no bronchodilator response 11/05/14:FVC 1.52 L (51%) FEV1 0.49 L (22%) FEV1/FVC 0.32 FEF 25-75 0.20 L (11%) no bronchodilator response ERV 87% DLCO uncorrected 27% - trained on flutter use 12/15/2016  - 09/06/2017  After extensive coaching inhaler device,  effectiveness =    75% from a baseline of 50%   Mild flare in setting of rhinitis s obvious infection > no change maint rx but if not better  rec Prednisone 10 mg take  4 each am x 2 days,   2 each am x 2 days,  1 each am x 2 days and stop and continue laba/ics neb  but work harder on technique for hfa/ smi

## 2017-09-07 NOTE — Assessment & Plan Note (Addendum)
HCO3  09/28/16 = 35   As of 09/06/2017   2 lpm at home,  4lpm pulsed outside of home> Adequate control on present rx, reviewed in detail with pt > no change in rx needed      I had an extended discussion with the patient reviewing all relevant studies completed to date and  lasting 15 to 20 minutes of a 25 minute visit    See device teaching which extended face to face time for this visit.  Each maintenance medication was reviewed in detail including emphasizing most importantly the difference between maintenance and prns and under what circumstances the prns are to be triggered using an action plan format that is not reflected in the computer generated alphabetically organized AVS which I have not found useful in most complex patients, especially with respiratory illnesses  Please see AVS for specific instructions unique to this visit that I personally wrote and verbalized to the the pt in detail and then reviewed with pt  by my nurse highlighting any  changes in therapy recommended at today's visit to their plan of care.

## 2017-09-19 ENCOUNTER — Telehealth: Payer: Self-pay | Admitting: Internal Medicine

## 2017-09-19 MED ORDER — TIOTROPIUM BROMIDE MONOHYDRATE 2.5 MCG/ACT IN AERS: 2.0000 | INHALATION_SPRAY | Freq: Every day | RESPIRATORY_TRACT | 11 refills | Status: DC

## 2017-09-19 NOTE — Telephone Encounter (Signed)
Spoke with pt. She is needing a refill on Spiriva Respimat. Rx has been sent in. Nothing further was needed. 

## 2017-10-06 ENCOUNTER — Ambulatory Visit (INDEPENDENT_AMBULATORY_CARE_PROVIDER_SITE_OTHER): Payer: Medicare Other | Admitting: Family Medicine

## 2017-10-06 ENCOUNTER — Encounter: Payer: Self-pay | Admitting: Family Medicine

## 2017-10-06 VITALS — BP 118/78 | HR 76 | Temp 97.8°F | Wt 110.0 lb

## 2017-10-06 DIAGNOSIS — I10 Essential (primary) hypertension: Secondary | ICD-10-CM | POA: Diagnosis not present

## 2017-10-06 DIAGNOSIS — J449 Chronic obstructive pulmonary disease, unspecified: Secondary | ICD-10-CM

## 2017-10-06 NOTE — Progress Notes (Signed)
Subjective:    Patient ID: Cathy Hensley, female    DOB: 09/17/1940, 77 y.o.   MRN: [id number]  No chief complaint on file.   HPI Patient was seen today for f/u and TOC, previously seen by Dr. Lesia Hausen.  Severe COPD: -on 2.5 L O2 via [place] -taking pulmicort, brovana, and spiriva -followed by Pulm, [name] -former smoker, quit in 2002  HTN: -taking norvasc 5 mg daily  Pt states she moved to [facility] yrs ago to be closer to her daughter who was expecting twins.  Pt has a son in [place], [place].  Pt lived in [place], [place], then moved to [place] in between [address] and [place].  Past Medical History:  Diagnosis Date  . Alpha-1-antitrypsin deficiency carrier   . COPD 07/13/2006  . HYPERTENSION 07/13/2006  . MENOPAUSAL SYNDROME 07/13/2006  . Other and unspecified hyperlipidemia 07/13/2006  . Respiratory failure (HCC)    Chronic hypoxic    No Known Allergies  ROS General: Denies fever, chills, night sweats, changes in weight, changes in appetite HEENT: Denies headaches, ear pain, changes in vision, rhinorrhea, sore throat CV: Denies CP, palpitations, SOB, orthopnea Pulm: Denies SOB, cough, wheezing GI: Denies abdominal pain, nausea, vomiting, diarrhea, constipation GU: Denies dysuria, hematuria, frequency, vaginal discharge Msk: Denies muscle cramps, joint pains Neuro: Denies weakness, numbness, tingling Skin: Denies rashes, bruising Psych: Denies depression, anxiety, hallucinations     Objective:    Blood pressure 118/78, pulse 76, temperature 97.8 F (36.6 C), temperature source Oral, weight 110 lb (49.9 kg), SpO2 94 %.   Gen. Pleasant, well-nourished, in no distress, normal affect   HEENT: [place]/AT, [place] in place-using portable O2 tank.  Face symmetric, no scleral icterus, PERRLA, nares patent without drainage Lungs: no accessory muscle use, CTAB, no wheezes or rales Cardiovascular: RRR, no m/r/g, no peripheral edema Neuro:  A&Ox3, CN II-XII intact, normal gait  Wt Readings  from Last 3 Encounters:  10/06/17 110 lb (49.9 kg)  09/06/17 109 lb (49.4 kg)  06/08/17 112 lb (50.8 kg)    Lab Results  Component Value Date   WBC 5.5 09/28/2016   HGB 12.8 09/28/2016   HCT 38.8 09/28/2016   PLT 199.0 09/28/2016   GLUCOSE 88 09/28/2016   CHOL 183 09/28/2016   TRIG 79.0 09/28/2016   HDL 77.00 09/28/2016   LDLDIRECT 127.9 10/19/2006   LDLCALC 90 09/28/2016   ALT 17 09/28/2016   AST 23 09/28/2016   NA 143 09/28/2016   K 4.2 09/28/2016   CL 102 09/28/2016   CREATININE 0.48 09/28/2016   BUN 14 09/28/2016   CO2 35 (H) 09/28/2016   TSH 0.90 09/28/2016    Assessment/Plan:  COPD GOLD IV  -continue 2.5L O2 -encouraged to keep f/u with Pulmonology -avoid allergens (smoke, dust, pollen, etc) -continue Pulmicort, Brovana, albuterol, singulair   Essential hypertension -controlled -continue current regimen of Norvasc 5 mg   F/u prn  Abbe Amsterdam, MD

## 2017-11-22 ENCOUNTER — Telehealth: Payer: Self-pay | Admitting: Internal Medicine

## 2017-11-22 NOTE — Telephone Encounter (Signed)
Called and spoke with patient, advised that we do not have any samples at this time. Advised patient that we may have some at the end of the week if she would like to call back. Nothing further needed.

## 2017-12-06 ENCOUNTER — Telehealth: Payer: Self-pay | Admitting: Internal Medicine

## 2017-12-06 NOTE — Telephone Encounter (Signed)
Spoke with pt, advise her that I would leave a Spiriva sample up front for her to pick up. Pt understood and nothing further is needed.

## 2017-12-20 ENCOUNTER — Other Ambulatory Visit: Payer: Self-pay | Admitting: Family Medicine

## 2017-12-24 IMAGING — DX DG CHEST 2V
2 series · 2 of 2 positions shown · non-contrast
Comparison: 12/06/2016

CLINICAL DATA: Follow-up pneumonia

EXAM:
CHEST  2 VIEW

[chest pa]
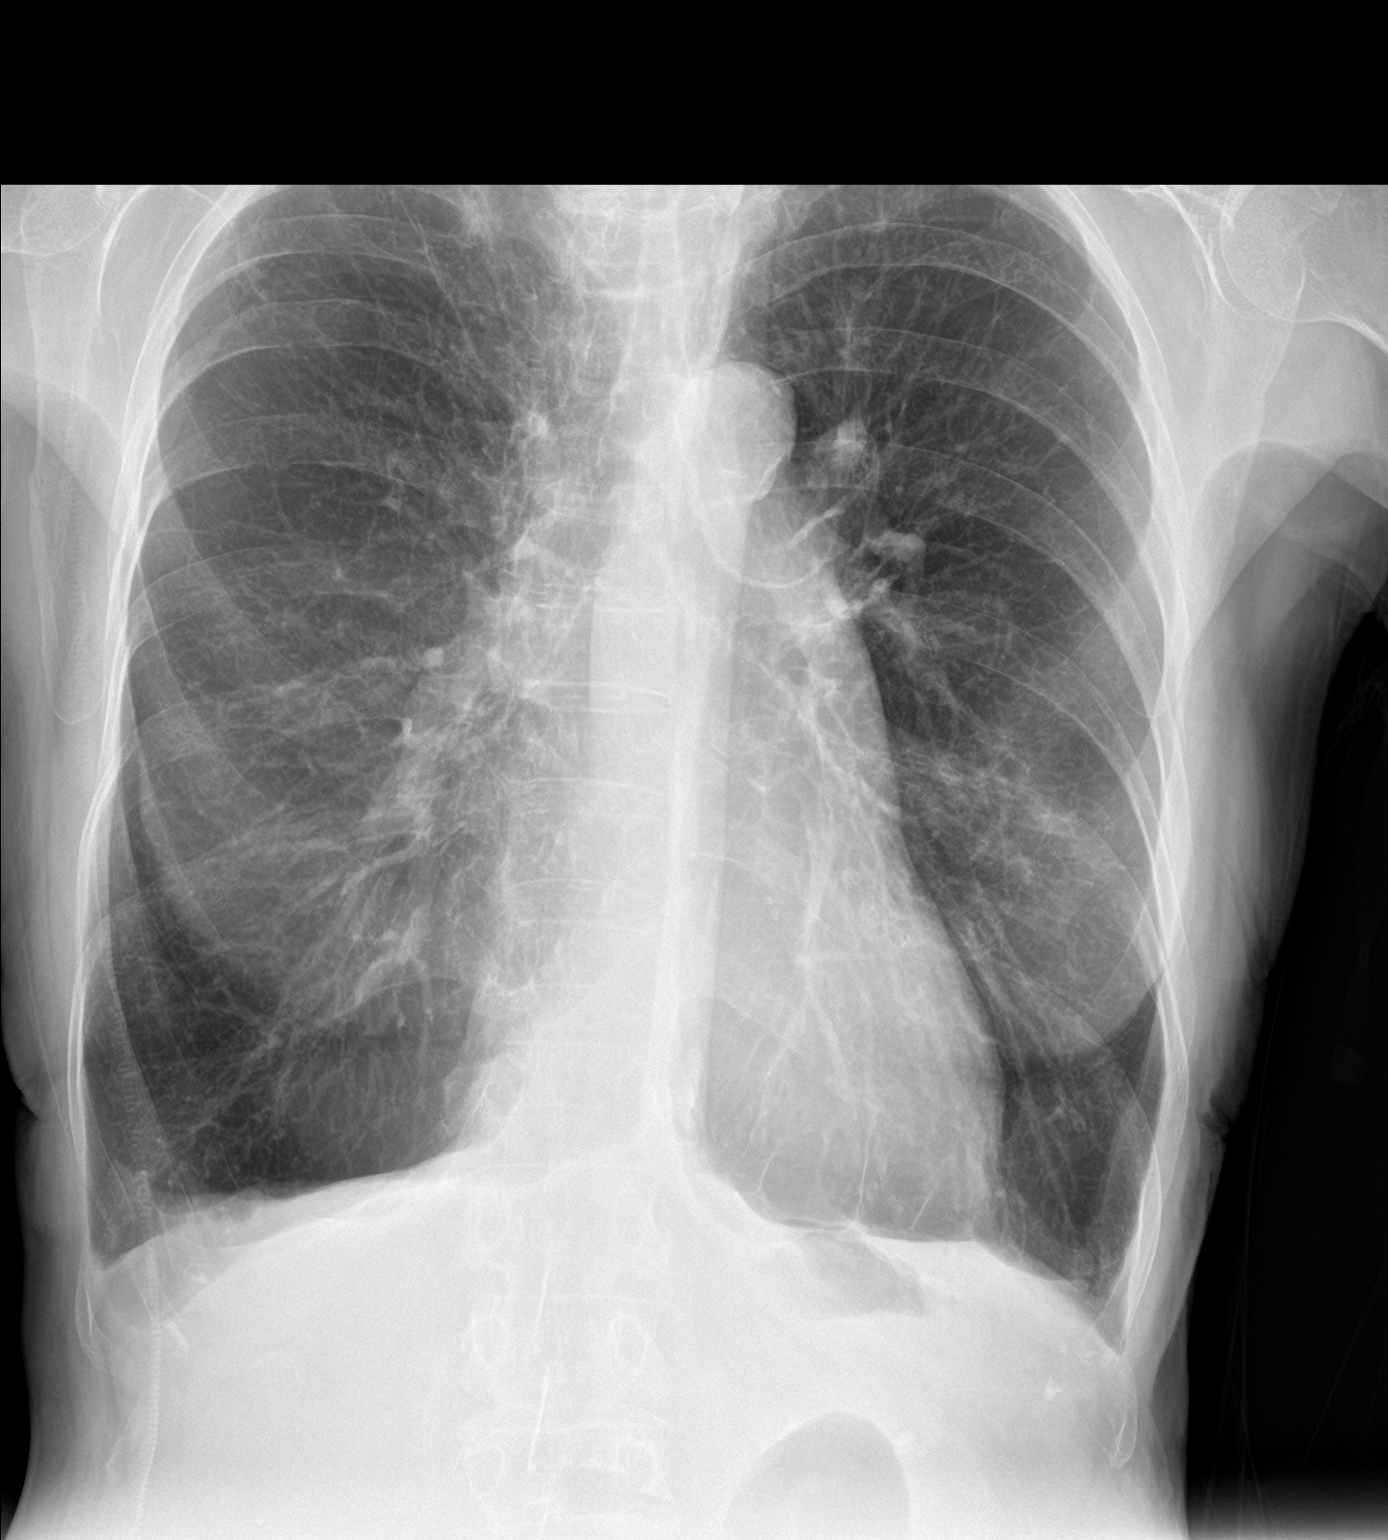

[chest lat]
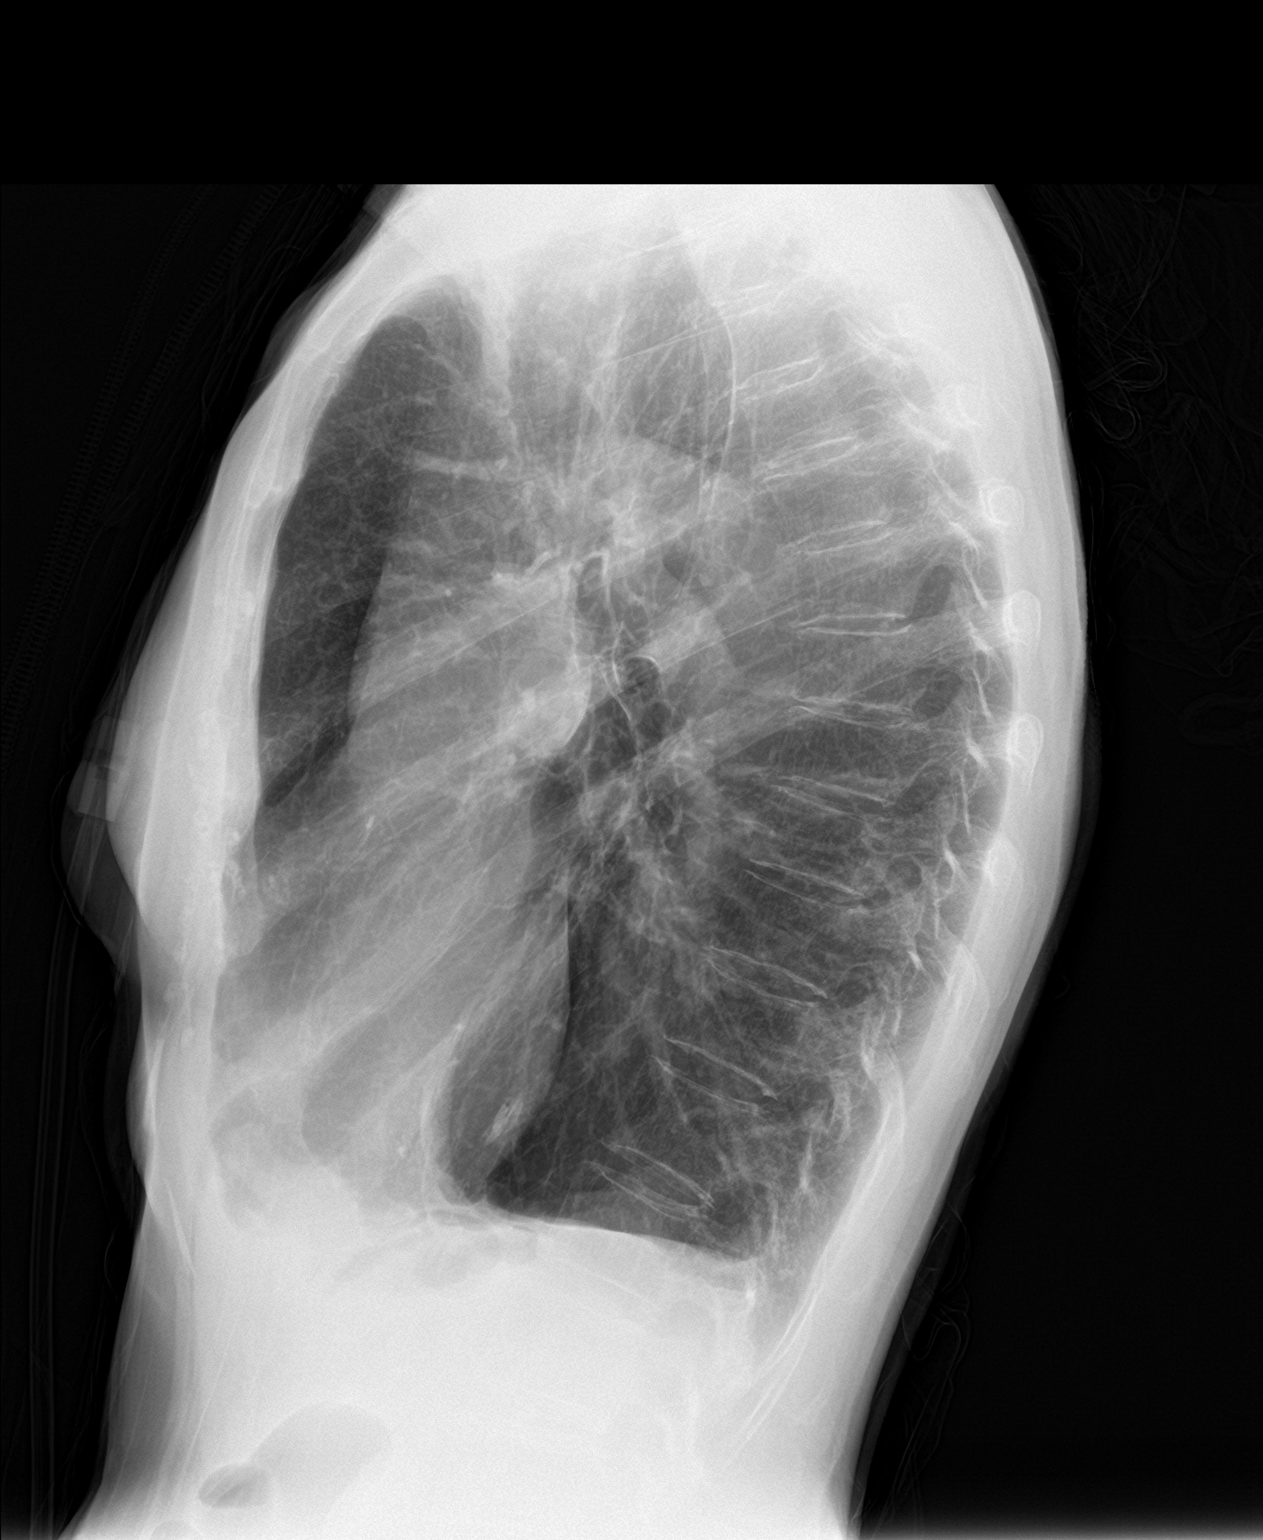

[2 of 2 positions shown; findings below may reference images not displayed]

FINDINGS: Cardiac shadow is stable. Aortic calcifications are again noted. The
lungs are hyperaerated consistent with COPD. Some improvement in the
previously seen left mid lung infiltrate is noted. Mild residual
density is seen.
IMPRESSION: Improved left mid lung infiltrate.

## 2018-01-09 ENCOUNTER — Ambulatory Visit (INDEPENDENT_AMBULATORY_CARE_PROVIDER_SITE_OTHER)
Admission: RE | Admit: 2018-01-09 | Discharge: 2018-01-09 | Disposition: A | Discharge status: Home / Self Care | Payer: Medicare Other | Source: Ambulatory Visit | Attending: Internal Medicine | Admitting: Internal Medicine

## 2018-01-09 ENCOUNTER — Ambulatory Visit (INDEPENDENT_AMBULATORY_CARE_PROVIDER_SITE_OTHER): Payer: Medicare Other | Admitting: Internal Medicine

## 2018-01-09 ENCOUNTER — Encounter: Payer: Self-pay | Admitting: Internal Medicine

## 2018-01-09 VITALS — BP 122/60 | HR 108 | Temp 98.2°F | Ht 63.5 in | Wt 106.0 lb

## 2018-01-09 DIAGNOSIS — J9611 Chronic respiratory failure with hypoxia: Secondary | ICD-10-CM

## 2018-01-09 DIAGNOSIS — J9612 Chronic respiratory failure with hypercapnia: Secondary | ICD-10-CM | POA: Diagnosis not present

## 2018-01-09 DIAGNOSIS — J441 Chronic obstructive pulmonary disease with (acute) exacerbation: Secondary | ICD-10-CM

## 2018-01-09 MED ORDER — AMOXICILLIN-POT CLAVULANATE 875-125 MG PO TABS: 1.0000 | ORAL_TABLET | Freq: Two times a day (BID) | ORAL | 0 refills | Status: AC

## 2018-01-09 NOTE — Patient Instructions (Addendum)
Augmentin 875 mg take one pill twice daily  X 10 days - take at breakfast and supper with large glass of water.  It would help reduce the usual side effects (diarrhea and yeast infections) if you ate cultured yogurt at lunch.   If allergies/ breathing/ wheezing increase need for albuterol,  breathing get worse >  Prednisone 10 mg take  4 each am x 2 days,   2 each am x 2 days,  1 each am x 2 days and sto  For cough > mucinex or mucinex dm  Max of 1200 mg every 12  hours with a glass of water and use flutter as much as possible    Please remember to go to the  x-ray department  in the basement  for your tests - we will call you with the results when they are available

## 2018-01-09 NOTE — Progress Notes (Signed)
\\  Subjective:   Patient ID: Cathy Hensley, female    DOB: August 01, 1940, 78 y.o.   MRN: [id number]     Brief patient profile:  21 yowf  MZ quit smoking  2002  severe COPD: Prescribed Pulmicort, Brovana, & Spiriva.  Chronic hypoxic respiratory failure: on  2.5 lpm home/ 4 pulsed with ambulation  Chronic hypercarbic respiratory failure: Not currently on noninvasive positive pressure ventilation. Secondary to COPD. Chronic allergic rhinitis: Prescribed Singulair.      12/06/2016 acute extended ov/Wynn Alldredge re:  GOLD IV/ 02 dep on brovana/bud/ spiriva  Chief Complaint  Patient presents with  . Acute Visit    Increased SOB and cough for the past 3 days. She states sputum is hard to produce, but when she does it's grey and green.   on 2.5 lpm home and 4lpm pulsed with baseline = MMRC3 = can't walk 100 yards even at a slow pace at a flat grade s stopping due to sob   Slept ok in recliner night before ov but usually able to lie flat in bed s am HA  Acutely worse x 72 with congested cough thick and slt grey/ green worse in am  Comfortable at rest/ no needing extra saba  rec zpak  Prednisone 10 mg take  4 each am x 2 days,   2 each am x 2 days,  1 each am x 2 days and stop  - add:  F/u with ov in one week - if worse in meantime rx levaquin 750 daily x 5 days     12/15/2016  f/u ov/Adalynne Steffensmeier re: copd / s/p pna  Chief Complaint  Patient presents with  . Follow-up    Cxr done today to follow up on pna.  Pt states that she is some better since last visit but not back to her usual self. Does have an occ. cough which is sometimes productive, SOB with exertion. Denies any CP. DME: [facility], 4L pulse when out, 2L constant at home.   mucus is grey to green/ no fever, no cp  Doe still = MMRC3  Able to lie flat hs now   mucus is most discolored in am/ very hard to cough up / scant amts  rec Hold the levaquin to see if you don't improve just mucinex up to 1200 mg every 12 hours and flutter valve Return in Aug  2019 - call sooner if needed     09/06/2017  f/u ov/Brailey Buescher re: GOLD IV  copd/ 02 dep resp failure/ hypercarbic/ poor hfa / smi  Chief Complaint  Patient presents with  . Follow-up    Increased SOB over the past few wks "allergies- it happens every year".  She has occ cough with green sputum.  She rarely uses her albuterol inhaler.   Dyspnea:  MMRC2 = can't walk a nl pace on a flat grade s sob but does fine slow and flat eg [employer] walking  Cough: is worse x sev weeks  Sleeping: 2.5 lpm x 2 pillows SABA use: rare 02: 2.5 at home, 4lpm portable pulsed  rec If allergies/ cough / breathing get worse >  Prednisone 10 mg take  4 each am x 2 days,   2 each am x 2 days,  1 each am x 2 days and stop  Flu shot today    01/09/2018   Acute extended ov/Jodene Polyak re: acute flare Jan 04 2018 started with head cold  Chief Complaint  Patient presents with  . Acute Visit  Pt c/o increased cough x 5 days- prod with green to brown sputum. She has also had body aches and HA.   now sleeping  in recliner position due to cough > sob  No rigors /sweats but sputum turning purulent assoc with nasal congestion Using flutter " occasionally   Did not start pred yet   no change 02 rx 2lpm home/ titrate when on portable     No obvious day to day or daytime variability or assoc  mucus plugs or hemoptysis or cp or chest tightness, subjective wheeze or overt  r hb symptoms.     Also denies any obvious fluctuation of symptoms with weather or environmental changes or other aggravating or alleviating factors except as outlined above   No unusual exposure hx or h/o childhood pna/ asthma or knowledge of premature birth.  Current Allergies, Complete Past Medical History, Past Surgical History, Family History, and Social History were reviewed in [facility] electronic medical record.  ROS  The following are not active complaints unless bolded Hoarseness, sore throat, dysphagia, dental problems, itching, sneezing,  nasal  congestion or discharge of excess mucus or purulent secretions, ear ache,   fever, chills, sweats, unintended wt loss or wt gain, classically pleuritic or exertional cp,  orthopnea pnd or arm/hand swelling  or leg swelling, presyncope, palpitations, abdominal pain, anorexia, nausea, vomiting, diarrhea  or change in bowel habits or change in bladder habits, change in stools or change in urine, dysuria, hematuria,  rash, arthralgias, visual complaints, headache, numbness, weakness or ataxia or problems with walking or coordination,  change in mood or  memory.        Current Meds  Medication Sig  . acetaminophen (TYLENOL) 500 MG tablet Take 500 mg by mouth every 6 (six) hours as needed.  Marland Kitchen. albuterol (PROVENTIL HFA;VENTOLIN HFA) 108 (90 Base) MCG/ACT inhaler Inhale 2 puffs into the lungs every 6 (six) hours as needed for wheezing or shortness of breath.  Marland Kitchen. amLODipine (NORVASC) 5 MG tablet TAKE 1 TABLET BY MOUTH DAILY  . arformoterol (BROVANA) 15 MCG/2ML NEBU Take 2 mLs (15 mcg total) by nebulization 2 (two) times daily.  Marland Kitchen. aspirin 81 MG chewable tablet Chew 1 tablet (81 mg total) by mouth daily.  . budesonide (PULMICORT) 0.25 MG/2ML nebulizer solution Take 2 mLs (0.25 mg total) by nebulization 2 (two) times daily.  . montelukast (SINGULAIR) 10 MG tablet TAKE 1 TABLET BY MOUTH AT BEDTIME  . Multiple Vitamins-Minerals (CENTRUM SILVER PO) Take 1 tablet by mouth daily.  . OXYGEN 2.5 lpm 24/7 (she uses 4lpm pulsed when on POC)  . Respiratory Therapy Supplies (FLUTTER) DEVI 1 Device by Does not apply route as needed.  . simvastatin (ZOCOR) 20 MG tablet TAKE 1 TABLET BY MOUTH AT BEDTIME  . Tiotropium Bromide Monohydrate (SPIRIVA RESPIMAT) 2.5 MCG/ACT AERS Inhale 2 puffs into the lungs daily.             Objective:   Physical Exam   amb wf congested rattling cough    01/09/2018          106  09/06/2017         109   12/15/2016    108   12/06/16 112 lb (50.8 kg)  09/28/16 113 lb (51.3 kg)  08/27/16  112 lb (50.8 kg)      Vital signs reviewed - Note on arrival 02 sats  91% on 2lpm continuous (desat on pulsed)       HEENT: Upper dentures/ nl oropharynx. Nl  external ear canals without cough reflex -  Mild bilateral non-specific turbinate edema     NECK :  without JVD/Nodes/TM/ nl carotid upstrokes bilaterally   LUNGS: no acc muscle use,  Mild barrel  contour chest wall with bilateral  Distant bs s audible wheeze and  without cough on insp or exp maneuver and mild  Hyperresonant  to  percussion bilaterally     CV:  RRR  no s3 or murmur or increase in P2, and no edema   ABD:  soft and nontender with pos late  insp Hoover's  in the supine position. No bruits or organomegaly appreciated, bowel sounds nl  MS:   Nl gait/  ext warm without deformities, calf tenderness, cyanosis  - mild clubbing No obvious joint restrictions   SKIN: warm and dry without lesions    NEURO:  alert, approp, nl sensorium with  no motor or cerebellar deficits apparent.            PFT 01/22/15: FVC 1.58 L (53%) FEV1 0.58 L (26%) FEV1/FVC 0.37 FEF 25-75 0.23 L (13%) no bronchodilator response 11/05/14:FVC 1.52 L (51%) FEV1 0.49 L (22%) FEV1/FVC 0.32 FEF 25-75 0.20 L (11%) no bronchodilator response ERV 87% DLCO uncorrected 27%       CARDIAC TTE (09/04/14): LV normal in size. Moderate LVH. EF 60-65%. No regional wall motion abnormalities. LA & RA normal in size. RV normal in size and function. Pulmonary artery systolic pressure 30 mmHg. No aortic stenosis or regurgitation. Normal aortic root. Mild mitral regurgitation. No pulmonic regurgitation. Trivial tricuspid regurgitation. Trivial pericardial effusion.                  Assessment & Plan:

## 2018-01-10 NOTE — Progress Notes (Signed)
Spoke with pt and notified of results per Dr. Wert. Pt verbalized understanding and denied any questions. 

## 2018-01-11 ENCOUNTER — Encounter: Payer: Self-pay | Admitting: Internal Medicine

## 2018-01-11 NOTE — Assessment & Plan Note (Signed)
HCO3  09/28/16 = 35   As of 01/09/2018   2 lpm at home,  Up to  4lpm pulsed outside of home   Adequate control on present rx, reviewed in detail with pt > no change in rx needed     I had an extended discussion with the patient reviewing all relevant studies completed to date and  lasting 15 to 20 minutes of a 25 minute acute office visit    See device teaching which extended face to face time for this visit.  Each maintenance medication was reviewed in detail including emphasizing most importantly the difference between maintenance and prns and under what circumstances the prns are to be triggered using an action plan format that is not reflected in the computer generated alphabetically organized AVS which I have not found useful in most complex patients, especially with respiratory illnesses  Please see AVS for specific instructions unique to this visit that I personally wrote and verbalized to the the pt in detail and then reviewed with pt  by my nurse highlighting any  changes in therapy recommended at today's visit to their plan of care.

## 2018-01-11 NOTE — Assessment & Plan Note (Signed)
11/06/14 Alpha-1 antitrypsin: MZ (88) PFT 01/22/15: FVC 1.58 L (53%) FEV1 0.58 L (26%) FEV1/FVC 0.37 FEF 25-75 0.23 L (13%) no bronchodilator response 11/05/14:FVC 1.52 L (51%) FEV1 0.49 L (22%) FEV1/FVC 0.32 FEF 25-75 0.20 L (11%) no bronchodilator response ERV 87% DLCO uncorrected 27% - trained on flutter use 12/15/2016   Flared in setting of URI ? Sinusitis > rec augmentin x 10 days/ flutter valve and if not improving rx with pred x 6 days   - The proper method of use, as well as anticipated side effects, of a metered-dose inhaler are discussed and demonstrated to the patient.

## 2018-03-07 ENCOUNTER — Ambulatory Visit: Payer: Medicare Other | Admitting: Internal Medicine

## 2018-03-14 ENCOUNTER — Telehealth: Payer: Self-pay | Admitting: Internal Medicine

## 2018-03-14 NOTE — Telephone Encounter (Signed)
They are all the same but may be updated on cdc website as we know more and she should check there

## 2018-03-14 NOTE — Telephone Encounter (Signed)
Called and spoke with patients daughter Cathy Hensley, she stated that the patient is very high risk category for catching the corona virus. Patients daughter is worried and wants to know what some extra precautions are that the patient can take. I advised her of the hand washing, no touching of face, eyes, noes, ears. Wearing a mask, sanitizer and decreasing social contact for 2-3 weeks. Patients daughter wants to know if [name] has posted any precautions for their COPD patients.   Please advise, thank you.

## 2018-03-15 NOTE — Telephone Encounter (Signed)
Called and spoke with patients daughter, she is aware and verbalized understanding. Nothing further needed. 

## 2018-03-17 ENCOUNTER — Other Ambulatory Visit: Payer: Self-pay | Admitting: Internal Medicine

## 2018-05-05 ENCOUNTER — Ambulatory Visit: Payer: Self-pay

## 2018-05-05 NOTE — Telephone Encounter (Signed)
FYI

## 2018-05-05 NOTE — Telephone Encounter (Signed)
Pt called stating that she has been taking an ASA 81mg  since 2016. She states that it was prescribed by a hospital doctor in 2016.  She has no bleeding symptoms. She had heard that it could cause bleeding. She was wondering if she should be taking it. Per office notes and medication list pt was advised that the doctors are aware she is taking and she should continue until advised no to take by her physician.Pt advised to report any unusual bleeding to PCP Pt verbalized understanding.   Reason for Disposition . Caller has medication question only, adult not sick, and triager answers question  Answer Assessment - Initial Assessment Questions 1. SYMPTOMS: "Do you have any symptoms?"     no 2. SEVERITY: If symptoms are present, ask "Are they mild, moderate or severe?"     No symptoms just wanted to know if she should continue ASA 81mg .  Protocols used: MEDICATION QUESTION CALL-A-AH

## 2018-05-10 ENCOUNTER — Telehealth: Payer: Self-pay | Admitting: Internal Medicine

## 2018-05-10 NOTE — Telephone Encounter (Signed)
Called pt back.  Pt states in early January 2020 she had a COPD flare-up. States it started 01/08/2018 after church. The next day 01/09/2018, she saw MW. States an Herby Abraham was performed and she was put on pred & doxycycline. 3 days later, she states her husband got sick w/ headache & raspy cough, no fever. States he "got over illness on his own."  Pt is wondering if her flare could have been COVID-19, since her husband got sick 3 days later. She also wanted to know if she could be tested and if, if she did have COVID-19, if that made her immune.   I informed pt COVID-19 testing and antibody testing is only available to pts in the hospital, reserved for highly suspected cases. Pt verbalized understanding, but would still like to hear from MW in regards to this.   MW, please advise on your recommendations for this pt. Pt states her breathing is at baseline.

## 2018-05-10 NOTE — Telephone Encounter (Signed)
LMTCB

## 2018-05-10 NOTE — Telephone Encounter (Signed)
Left message for patient to call back  

## 2018-05-10 NOTE — Telephone Encounter (Signed)
Patient is returning phone call.  Patient phone number is [phone].

## 2018-05-10 NOTE — Telephone Encounter (Signed)
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 She wants to know if its possible to be tested now? Is it possible that if she had it she could have built immunity to the virus?

## 2018-05-10 NOTE — Telephone Encounter (Signed)
We can do the antibody test but it's not very accurate and best role is for health care workers or anyone likely to be repeatedly exposed - because she is copd pt she should minimize exposure regardless but happy to do as part of her next office visit if want to pursue the results given the above caveats

## 2018-05-11 NOTE — Telephone Encounter (Signed)
Called pt and advised message from the provider. Pt understood and verbalized understanding. Nothing further is needed.    

## 2018-05-11 NOTE — Telephone Encounter (Signed)
ATC LMTCB  

## 2018-06-14 ENCOUNTER — Other Ambulatory Visit: Payer: Self-pay | Admitting: Family Medicine

## 2018-06-27 ENCOUNTER — Telehealth: Payer: Self-pay | Admitting: Internal Medicine

## 2018-06-27 MED ORDER — SPIRIVA RESPIMAT 2.5 MCG/ACT IN AERS: 2.0000 | INHALATION_SPRAY | Freq: Every day | RESPIRATORY_TRACT | 0 refills | Status: DC

## 2018-06-27 NOTE — Telephone Encounter (Signed)
Spoke with pt, advised her that I left Spiriva samples up front for her to pick up. Pt understood, nothing further is needed.

## 2018-09-05 ENCOUNTER — Ambulatory Visit (INDEPENDENT_AMBULATORY_CARE_PROVIDER_SITE_OTHER): Payer: Medicare Other | Admitting: Adult Health

## 2018-09-05 ENCOUNTER — Telehealth: Payer: Self-pay | Admitting: Internal Medicine

## 2018-09-05 ENCOUNTER — Other Ambulatory Visit: Payer: Self-pay

## 2018-09-05 DIAGNOSIS — J9611 Chronic respiratory failure with hypoxia: Secondary | ICD-10-CM | POA: Diagnosis not present

## 2018-09-05 DIAGNOSIS — J9612 Chronic respiratory failure with hypercapnia: Secondary | ICD-10-CM | POA: Diagnosis not present

## 2018-09-05 DIAGNOSIS — J441 Chronic obstructive pulmonary disease with (acute) exacerbation: Secondary | ICD-10-CM | POA: Diagnosis not present

## 2018-09-05 MED ORDER — PREDNISONE 10 MG PO TABS: ORAL_TABLET | ORAL | 0 refills | Status: DC

## 2018-09-05 MED ORDER — AZITHROMYCIN 250 MG PO TABS: ORAL_TABLET | ORAL | 0 refills | Status: AC

## 2018-09-05 NOTE — Telephone Encounter (Signed)
Patient returned phone call, tele visit appt made per MW recommendations. Voiced understanding. Nothing further needed at this time.

## 2018-09-05 NOTE — Progress Notes (Signed)
Virtual Visit via Telephone Note  I connected with Cathy Hensley on 09/05/18 at  2:30 PM EDT by telephone and verified that I am speaking with the correct person using two identifiers.  Location: Patient: Home  Provider: Office    I discussed the limitations, risks, security and privacy concerns of performing an evaluation and management service by telephone and the availability of in person appointments. I also discussed with the patient that there may be a patient responsible charge related to this service. The patient expressed understanding and agreed to proceed.   History of Present Illness: 78 yo female former smoker followed for severe COPD and chronic oxygen dependent respiratory failure   Today's televisit is for an acute office visit for COPD  Patient complains of 3 weeks of increased cough, congestion with thick mucus , dyspnea and fatigue . Intermittent wheezing and decreased energy . No fever. No known sick contacts. Has been social distancing at home .  More shortness of breath and fatigue.  Uses Brovana Pulmicort twice daily and Spiriva every day.  Has flutter valve, uses sometimes. Appetite is fair , no n/v.d.     Observations/Objective: TEST /Events Completed pulmonary rehab 2017   PFT 01/22/15: FVC 1.58 L (53%) FEV1 0.58 L (26%) FEV1/FVC 0.37 FEF 25-75 0.23 L (13%) no bronchodilator response 11/05/14:FVC 1.52 L (51%) FEV1 0.49 L (22%) FEV1/FVC 0.32 FEF 25-75 0.20 L (11%) no bronchodilator response ERV 87% DLCO uncorrected 27%  6MWT 01/22/15: Walked 192 meters / Baseline Sat 93% on RA / Nadir Sat 70% on RA (maintained saturation with 3 L/m pulse at 90%)  IMAGING Portable CXR 09/03/14 (previouslyreviewed by me):Hyperinflation with flattening of the diaphragms. No focal opacity or mass appreciated. No pleural effusion or thickening appreciated. Heart normal in size. Mediastinum normal in contour.  CARDIAC TTE (09/04/14): LV normal in size. Moderate LVH. EF  60-65%. No regional wall motion abnormalities. LA & RA normal in size. RV normal in size and function. Pulmonary artery systolic pressure 30 mmHg. No aortic stenosis or regurgitation. Normal aortic root. Mild mitral regurgitation. No pulmonic regurgitation. Trivial tricuspid regurgitation. Trivial pericardial effusion.  MICROBIOLOGY Sputum (09/06/14): Oral flora Urine Strep Ag (09/06/14): Negative Urine Legionella Ag (09/06/14): Negative Influenza A/B PCR (09/06/14): Negative   LABS 11/06/14 Alpha-1 antitrypsin: MZ (88)  09/06/14 ABG on 3 L/m: 7.40 / 65 / 63 / 91% CBC: 11.5/14.2/46.3/240 BMP: 140/4.4/99/41/19/0.38/118/9.2 LFT: 3.7/6.3/0.5/61/20/21   Assessment and Plan: COPD exacerbation   Plan  Patient Instructions  Z-Pak take as directed Prednisone taper over the next week Mucinex DM twice daily as needed for cough congestion Use flutter valve 2-3 times a day Follow up in 2 weeks with Dr. Melvyn Novas  Or  NP and As needed   Please contact office for sooner follow up if symptoms do not improve or worsen or seek emergency care       Follow Up Instructions: Follow up in 2 weeks and As needed   Please contact office for sooner follow up if symptoms do not improve or worsen or seek emergency care     I discussed the assessment and treatment plan with the patient. The patient was provided an opportunity to ask questions and all were answered. The patient agreed with the plan and demonstrated an understanding of the instructions.   The patient was advised to call back or seek an in-person evaluation if the symptoms worsen or if the condition fails to improve as anticipated.  I provided 22  minutes of non-face-to-face  time during this encounter.   [name], NP

## 2018-09-05 NOTE — Telephone Encounter (Signed)
Call returned to patient, she is requesting to ask MW when he thinks she should get a flu shot and if she should get the high dose. Also requesting prednisone. She states she is having some increased wheezing and chest heaviness. She reports he gives her a emergency pack to keep at home. Offered a appt, she declined stating I am staying away from physicians offices. I made her aware we could do the visit via phone and she states she is open to that but talk with MW first. She reports she feels fine but she wants to address the wheezing now before it gets bad.   MW please advise, should patient get high dose flu shot, if so when, she thinks it is too early, wanting to wait until October. Also can she have prednisone.

## 2018-09-05 NOTE — Telephone Encounter (Signed)
LMTCB. Please make tele-visit appt per MW.

## 2018-09-05 NOTE — Patient Instructions (Signed)
Z-Pak take as directed Prednisone taper over the next week Mucinex DM twice daily as needed for cough congestion Use flutter valve 2-3 times a day Follow up in 2 weeks with Dr. Melvyn Novas  Or Alexandrina Fiorini NP and As needed   Please contact office for sooner follow up if symptoms do not improve or worsen or seek emergency care

## 2018-09-05 NOTE — Telephone Encounter (Signed)
Prefer phone visit with np but if nothing available today rec Prednisone 10 mg take  4 each am x 2 days,   2 each am x 2 days,  1 each am x 2 days and stop and then ov with me either way in 2 weeks for flu shot high strength but no need to give flu shot until over flare

## 2018-09-12 ENCOUNTER — Other Ambulatory Visit: Payer: Self-pay | Admitting: Family Medicine

## 2018-09-15 ENCOUNTER — Other Ambulatory Visit: Payer: Self-pay

## 2018-09-15 ENCOUNTER — Telehealth (INDEPENDENT_AMBULATORY_CARE_PROVIDER_SITE_OTHER): Payer: Medicare Other | Admitting: Family Medicine

## 2018-09-15 DIAGNOSIS — J449 Chronic obstructive pulmonary disease, unspecified: Secondary | ICD-10-CM | POA: Diagnosis not present

## 2018-09-15 DIAGNOSIS — E782 Mixed hyperlipidemia: Secondary | ICD-10-CM | POA: Diagnosis not present

## 2018-09-15 DIAGNOSIS — I10 Essential (primary) hypertension: Secondary | ICD-10-CM | POA: Diagnosis not present

## 2018-09-15 MED ORDER — BROVANA 15 MCG/2ML IN NEBU: 15.0000 ug | INHALATION_SOLUTION | Freq: Two times a day (BID) | RESPIRATORY_TRACT | 12 refills | Status: DC

## 2018-09-15 MED ORDER — AMLODIPINE BESYLATE 5 MG PO TABS: 5.0000 mg | ORAL_TABLET | Freq: Every day | ORAL | 3 refills | Status: DC

## 2018-09-15 MED ORDER — MONTELUKAST SODIUM 10 MG PO TABS: 10.0000 mg | ORAL_TABLET | Freq: Every day | ORAL | 3 refills | Status: DC

## 2018-09-15 MED ORDER — SIMVASTATIN 20 MG PO TABS: 20.0000 mg | ORAL_TABLET | Freq: Every day | ORAL | 3 refills | Status: DC

## 2018-09-15 MED ORDER — BUDESONIDE 0.25 MG/2ML IN SUSP: 0.2500 mg | Freq: Two times a day (BID) | RESPIRATORY_TRACT | 12 refills | Status: DC

## 2018-09-15 NOTE — Progress Notes (Signed)
Virtual Visit via Telephone Note  I connected with Cathy Hensley on 09/15/18 at 10:30 AM EDT by telephone and verified that I am speaking with the correct person using two identifiers.   I discussed the limitations, risks, security and privacy concerns of performing an evaluation and management service by telephone and the availability of in person appointments. I also discussed with the patient that there may be a patient responsible charge related to this service. The patient expressed understanding and agreed to proceed.  Location patient: home Location provider: work or home office Participants present for the call: patient, provider Patient did not have a visit in the prior 7 days to address this/these issue(s).   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  Followed by Dr. Melvyn Novas, Pulmonology for her COPD.  Has an appt next Friday.  Completed a course of prednisone and azithromycin.  Pt states this time of year she gets COPD exacerbations.  Pt states she was exercising prior to COVID 19 pandemic.  Pt on 2.5L O2 when in the house, increases it to 3L when out of the house.  Pt is hesitant to come into the doctor's office given her h/o COPD.  Pt needs refills on brovana, budesonide, simvastatin, and montelukast [facility] is her supplier.    Pt states her husband fell hitting his head recently.  Pt had to take him to the ED.  His laceration required repair, but he is doing well now.   Observations/Objective: Patient sounds cheerful and well on the phone. I do not appreciate any SOB. Speech and thought processing are grossly intact. Patient reported vitals:  Assessment and Plan: Essential hypertension -continue lifestyle modifications - Plan: amLODipine (NORVASC) 5 MG tablet  COPD GOLD IV   -stable -continue continuous O2 2.5-3L -continue f/u with Pulmonology - Plan: arformoterol  (BROVANA) 15 MCG/2ML NEBU, budesonide (PULMICORT) 0.25 MG/2ML nebulizer solution, montelukast (SINGULAIR) 10 MG tablet  Mixed hyperlipidemia  -will check lipid panel in near future -continue lifestyle modifications - Plan: simvastatin (ZOCOR) 20 MG tablet   Follow Up Instructions: F/u prn in the next 3-4 months, sooner if needed.   I did not refer this patient for an OV in the next 24 hours for this/these issue(s).  I discussed the assessment and treatment plan with the patient. The patient was provided an opportunity to ask questions and all were answered. The patient agreed with the plan and demonstrated an understanding of the instructions.   The patient was advised to call back or seek an in-person evaluation if the symptoms worsen or if the condition fails to improve as anticipated.  I provided 13 minutes of non-face-to-face time during this encounter.   Billie Ruddy, MD

## 2018-09-17 NOTE — Progress Notes (Signed)
Chart and office note reviewed in detail  > agree with a/p as outlined    

## 2018-09-21 ENCOUNTER — Other Ambulatory Visit: Payer: Self-pay

## 2018-09-21 ENCOUNTER — Ambulatory Visit: Payer: Medicare Other | Admitting: Adult Health

## 2018-09-21 ENCOUNTER — Encounter: Payer: Self-pay | Admitting: Adult Health

## 2018-09-21 DIAGNOSIS — J9611 Chronic respiratory failure with hypoxia: Secondary | ICD-10-CM

## 2018-09-21 DIAGNOSIS — J449 Chronic obstructive pulmonary disease, unspecified: Secondary | ICD-10-CM

## 2018-09-21 DIAGNOSIS — Z23 Encounter for immunization: Secondary | ICD-10-CM

## 2018-09-21 DIAGNOSIS — J9612 Chronic respiratory failure with hypercapnia: Secondary | ICD-10-CM | POA: Diagnosis not present

## 2018-09-21 NOTE — Assessment & Plan Note (Signed)
Recent exacerbation now resolved  Plan  Patient Instructions  Continue on Spiriva daily  Continue on Brovana and Pulmicort Neb Twice daily   Continue on Singulair daily .  ProAir 2 puffs every 4hrs as needed wheezing .  Flu shot today  Continue on 2.5L/m oxygen . (Pulsing device 4l/m )  Follow up with Dr. Melvyn Novas  Or Calypso Hagarty NP in 4-6 months and As needed

## 2018-09-21 NOTE — Patient Instructions (Addendum)
Continue on Spiriva daily  Continue on Brovana and Pulmicort Neb Twice daily   Continue on Singulair daily .  ProAir 2 puffs every 4hrs as needed wheezing .  Flu shot today  Continue on 2.5L/m oxygen . (Pulsing device 4l/m )  Follow up with Dr. Melvyn Novas  Or Parrett NP in 4-6 months and As needed

## 2018-09-21 NOTE — Progress Notes (Signed)
 @Patient  ID: Cathy SpatesMaryJoan E Hensley, female    DOB: 03/08/1940, 78 y.o.   MRN: [id number]  Chief Complaint  Patient presents with   Follow-up    COPD     Referring provider: Deeann SaintBanks, Shannon R, MD  HPI: 78 year old female former smoker followed for severe COPD and oxygen dependent respiratory failure  TEST/EVENTS :  TEST /Events Completed pulmonary rehab 2017   PFT 01/22/15: FVC 1.58 L (53%) FEV1 0.58 L (26%) FEV1/FVC 0.37 FEF 25-75 0.23 L (13%) no bronchodilator response 11/05/14:FVC 1.52 L (51%) FEV1 0.49 L (22%) FEV1/FVC 0.32 FEF 25-75 0.20 L (11%) no bronchodilator response ERV 87% DLCO uncorrected 27%  6MWT 01/22/15: Walked 192 meters / Baseline Sat 93% on RA / Nadir Sat 70% on RA (maintained saturation with 3 L/m pulse at 90%)  IMAGING Portable CXR 09/03/14 (previouslyreviewed by me):Hyperinflation with flattening of the diaphragms. No focal opacity or mass appreciated. No pleural effusion or thickening appreciated. Heart normal in size. Mediastinum normal in contour.  CARDIAC TTE (09/04/14): LV normal in size. Moderate LVH. EF 60-65%. No regional wall motion abnormalities. LA & RA normal in size. RV normal in size and function. Pulmonary artery systolic pressure 30 mmHg. No aortic stenosis or regurgitation. Normal aortic root. Mild mitral regurgitation. No pulmonic regurgitation. Trivial tricuspid regurgitation. Trivial pericardial effusion.  MICROBIOLOGY Sputum (09/06/14): Oral flora Urine Strep Ag (09/06/14): Negative Urine Legionella Ag (09/06/14): Negative Influenza A/B PCR (09/06/14): Negative   LABS 11/06/14 Alpha-1 antitrypsin: MZ (88)  09/06/14 ABG on 3 L/m: 7.40 / 65 / 63 / 91% CBC: 11.5/14.2/46.3/240 BMP: 140/4.4/99/41/19/0.38/118/9.2 LFT: 3.7/6.3/0.5/61/20/21  09/21/2018 Follow up : COPD , O2 RF  Patient returns for a 2-week follow-up.  Patient was seen last visit for a COPD exacerbation.  She was treated with a Z-Pak and prednisone taper.  Patient says  she is feeling improved feeling that she is getting close to her baseline.  Still gets short of breath with minimum activity.  Her cough and congestion have decreased.  She remains on Spiriva daily, Brovana and Pulmicort nebulizer twice daily.  She is on Singulair daily.  She is on oxygen 2.5 L.  When she uses her pulsing oxygen device she does not use it on 4 L. Appetite is fair.  No chest pain orthopnea PND leg swelling or hemoptysis   No Known Allergies  Immunization History  Administered Date(s) Administered   Fluad Quad(high Dose 65+) 09/21/2018   Influenza Split 10/05/2010, 11/15/2012   Influenza, High Dose Seasonal PF 09/21/2013, 10/01/2014, 09/06/2017   Influenza,inj,Quad PF,6+ Mos 08/27/2016   Influenza-Unspecified 08/24/2015   Pneumococcal Conjugate-13 01/19/2013   Pneumococcal Polysaccharide-23 01/04/2005, 03/19/2010   Td 09/05/2003   Tdap 06/29/2010    Past Medical History:  Diagnosis Date   Alpha-1-antitrypsin deficiency carrier    COPD 07/13/2006   HYPERTENSION 07/13/2006   MENOPAUSAL SYNDROME 07/13/2006   Other and unspecified hyperlipidemia 07/13/2006   Respiratory failure (HCC)    Chronic hypoxic    Tobacco History: Social History   Tobacco Use  Smoking Status Former Smoker   Packs/day: 0.50   Years: 42.00   Pack years: 21.00   Types: Cigarettes   Quit date: 09/23/2000   Years since quitting: 18.0  Smokeless Tobacco Never Used   Counseling given: Not Answered   Outpatient Medications Prior to Visit  Medication Sig Dispense Refill   acetaminophen (TYLENOL) 500 MG tablet Take 500 mg by mouth every 6 (six) hours as needed.     albuterol (PROVENTIL HFA;VENTOLIN HFA) 108 (  90 Base) MCG/ACT inhaler INHALE 2 PUFFS INTO THE LUNGS EVERY 6 HOURS AS NEEDED FOR WHEEZING OR SHORTNESS OF BREATH 8.5 g 3   amLODipine (NORVASC) 5 MG tablet Take 1 tablet (5 mg total) by mouth daily. 90 tablet 3   arformoterol (BROVANA) 15 MCG/2ML NEBU Take 2 mLs (15  mcg total) by nebulization 2 (two) times daily. 120 mL 12   aspirin 81 MG chewable tablet Chew 1 tablet (81 mg total) by mouth daily.     budesonide (PULMICORT) 0.25 MG/2ML nebulizer solution Take 2 mLs (0.25 mg total) by nebulization 2 (two) times daily. 120 mL 12   montelukast (SINGULAIR) 10 MG tablet Take 1 tablet (10 mg total) by mouth at bedtime. 90 tablet 3   Multiple Vitamins-Minerals (CENTRUM SILVER PO) Take 1 tablet by mouth daily.     OXYGEN 2.5 lpm 24/7 (she uses 4lpm pulsed when on POC)     Respiratory Therapy Supplies (FLUTTER) DEVI 1 Device by Does not apply route as needed. 1 each 0   simvastatin (ZOCOR) 20 MG tablet Take 1 tablet (20 mg total) by mouth at bedtime. 90 tablet 3   Tiotropium Bromide Monohydrate (SPIRIVA RESPIMAT) 2.5 MCG/ACT AERS Inhale 2 puffs into the lungs daily. 8 g 0   predniSONE (DELTASONE) 10 MG tablet 4 tabs for 2 days, then 3 tabs for 2 days, 2 tabs for 2 days, then 1 tab for 2 days, then stop (Patient not taking: Reported on 09/21/2018) 20 tablet 0   No facility-administered medications prior to visit.      Review of Systems:   Constitutional:   No  weight loss, night sweats,  Fevers, chills,  +fatigue, or  lassitude.  HEENT:   No headaches,  Difficulty swallowing,  Tooth/dental problems, or  Sore throat,                No sneezing, itching, ear ache, nasal congestion, post nasal drip,   CV:  No chest pain,  Orthopnea, PND, swelling in lower extremities, anasarca, dizziness, palpitations, syncope.   GI  No heartburn, indigestion, abdominal pain, nausea, vomiting, diarrhea, change in bowel habits, loss of appetite, bloody stools.   Resp:  .  No chest wall deformity  Skin: no rash or lesions.  GU: no dysuria, change in color of urine, no urgency or frequency.  No flank pain, no hematuria   MS:  No joint pain or swelling.  No decreased range of motion.  No back pain.    Physical Exam  BP 120/64 (BP Location: Left Arm, Cuff Size:  Normal)    Pulse 80    Temp (!) 97 F (36.1 C) (Temporal)    Ht 5\' 5"  (1.651 m)    Wt 104 lb (47.2 kg)    SpO2 96%    BMI 17.31 kg/m   GEN: A/Ox3; pleasant , NAD, thin and elderly   HEENT:  [place]/AT, NOSE-clear, THROAT-clear, no lesions, no postnasal drip or exudate noted.   NECK:  Supple w/ fair ROM; no JVD; normal carotid impulses w/o bruits; no thyromegaly or nodules palpated; no lymphadenopathy.    RESP diminished breath sounds in the bases no accessory muscle use, no dullness to percussion  CARD:  RRR, no m/r/g, no peripheral edema, pulses intact, no cyanosis or clubbing.  GI:   Soft & nt; nml bowel sounds; no organomegaly or masses detected.   Musco: Warm bil, no deformities or joint swelling noted.   Neuro: alert, no focal deficits noted.  Skin: Warm, no lesions or rashes    Lab Results:  CBC  ProBNP No results found for: PROBNP  Imaging: No results found.    PFT Results Latest Ref Rng & Units 01/22/2015 11/05/2014  FVC-Pre L 1.58 1.52  FVC-Predicted Pre % 53 51  FVC-Post L 1.63 1.60  FVC-Predicted Post % 55 54  Pre FEV1/FVC % % 37 32  Post FEV1/FCV % % 36 35  FEV1-Pre L 0.58 0.49  FEV1-Predicted Pre % 26 22  FEV1-Post L 0.59 0.56  DLCO UNC% % 27 27  DLCO COR %Predicted % 47 42    No results found for: NITRICOXIDE      Assessment & Plan:   COPD GOLD IV  Recent exacerbation now resolved  Plan  Patient Instructions  Continue on Spiriva daily  Continue on Brovana and Pulmicort Neb Twice daily   Continue on Singulair daily .  ProAir 2 puffs every 4hrs as needed wheezing .  Flu shot today  Continue on 2.5L/m oxygen . (Pulsing device 4l/m )  Follow up with Dr. Melvyn Novas  Or Shamal Stracener NP in 4-6 months and As needed       Chronic respiratory failure with hypoxia and hypercapnia (HCC) Continue on oxygen 2.5 L.  O2 saturation goal greater than 88 to 90%     Rexene Edison, NP 09/21/2018

## 2018-09-21 NOTE — Assessment & Plan Note (Signed)
Continue on oxygen 2.5 L.  O2 saturation goal greater than 88 to 90%

## 2018-09-22 MED ORDER — SPIRIVA RESPIMAT 2.5 MCG/ACT IN AERS: 2.0000 | INHALATION_SPRAY | Freq: Every day | RESPIRATORY_TRACT | 0 refills | Status: DC

## 2018-09-22 NOTE — Addendum Note (Signed)
Addended by: Parke Poisson E on: 09/22/2018 09:14 AM   Modules accepted: Orders

## 2018-09-27 NOTE — Progress Notes (Signed)
Chart and office note reviewed in detail  > agree with a/p as outlined    

## 2018-10-10 DIAGNOSIS — J449 Chronic obstructive pulmonary disease, unspecified: Secondary | ICD-10-CM | POA: Diagnosis not present

## 2018-10-18 DIAGNOSIS — J449 Chronic obstructive pulmonary disease, unspecified: Secondary | ICD-10-CM | POA: Diagnosis not present

## 2018-11-10 DIAGNOSIS — J449 Chronic obstructive pulmonary disease, unspecified: Secondary | ICD-10-CM | POA: Diagnosis not present

## 2018-11-15 DIAGNOSIS — J449 Chronic obstructive pulmonary disease, unspecified: Secondary | ICD-10-CM | POA: Diagnosis not present

## 2018-12-10 DIAGNOSIS — J449 Chronic obstructive pulmonary disease, unspecified: Secondary | ICD-10-CM | POA: Diagnosis not present

## 2018-12-12 ENCOUNTER — Telehealth: Payer: Self-pay | Admitting: Adult Health

## 2018-12-12 NOTE — Telephone Encounter (Signed)
Spoke with pt who asked if there was a plan for [place] to give Covid vaccines to our patients.  I advised that since it has not yet been approved by the CDC we do not have any blueprints for when exactly we will be dispensing these to patients, but assured her that as we knew more we would keep our patients updated.  Pt expressed understanding.  Nothing further needed at this time- will close encounter.

## 2018-12-19 DIAGNOSIS — J449 Chronic obstructive pulmonary disease, unspecified: Secondary | ICD-10-CM | POA: Diagnosis not present

## 2019-01-10 DIAGNOSIS — J449 Chronic obstructive pulmonary disease, unspecified: Secondary | ICD-10-CM | POA: Diagnosis not present

## 2019-01-15 ENCOUNTER — Other Ambulatory Visit: Payer: Self-pay | Admitting: Internal Medicine

## 2019-01-15 MED ORDER — SPIRIVA RESPIMAT 2.5 MCG/ACT IN AERS: 2.0000 | INHALATION_SPRAY | Freq: Every day | RESPIRATORY_TRACT | 11 refills | Status: DC

## 2019-01-17 DIAGNOSIS — J449 Chronic obstructive pulmonary disease, unspecified: Secondary | ICD-10-CM | POA: Diagnosis not present

## 2019-02-07 ENCOUNTER — Emergency Department (HOSPITAL_COMMUNITY)
Admission: EM | Admit: 2019-02-07 | Discharge: 2019-02-07 | Disposition: A | Discharge status: Home / Self Care | Payer: Medicare Other | Attending: Emergency Medicine | Admitting: Emergency Medicine

## 2019-02-07 ENCOUNTER — Emergency Department (HOSPITAL_COMMUNITY): Payer: Medicare Other

## 2019-02-07 ENCOUNTER — Telehealth: Payer: Medicare Other | Admitting: Family Medicine

## 2019-02-07 ENCOUNTER — Telehealth: Payer: Self-pay

## 2019-02-07 ENCOUNTER — Other Ambulatory Visit: Payer: Self-pay

## 2019-02-07 ENCOUNTER — Encounter (HOSPITAL_COMMUNITY): Payer: Self-pay | Admitting: Emergency Medicine

## 2019-02-07 DIAGNOSIS — Z87891 Personal history of nicotine dependence: Secondary | ICD-10-CM | POA: Insufficient documentation

## 2019-02-07 DIAGNOSIS — Z7982 Long term (current) use of aspirin: Secondary | ICD-10-CM | POA: Diagnosis not present

## 2019-02-07 DIAGNOSIS — Z79899 Other long term (current) drug therapy: Secondary | ICD-10-CM | POA: Diagnosis not present

## 2019-02-07 DIAGNOSIS — Y929 Unspecified place or not applicable: Secondary | ICD-10-CM | POA: Diagnosis not present

## 2019-02-07 DIAGNOSIS — J439 Emphysema, unspecified: Secondary | ICD-10-CM | POA: Diagnosis not present

## 2019-02-07 DIAGNOSIS — N3001 Acute cystitis with hematuria: Secondary | ICD-10-CM

## 2019-02-07 DIAGNOSIS — R55 Syncope and collapse: Secondary | ICD-10-CM | POA: Insufficient documentation

## 2019-02-07 DIAGNOSIS — Y999 Unspecified external cause status: Secondary | ICD-10-CM | POA: Diagnosis not present

## 2019-02-07 DIAGNOSIS — I10 Essential (primary) hypertension: Secondary | ICD-10-CM | POA: Diagnosis not present

## 2019-02-07 DIAGNOSIS — W2209XA Striking against other stationary object, initial encounter: Secondary | ICD-10-CM | POA: Insufficient documentation

## 2019-02-07 DIAGNOSIS — S0181XA Laceration without foreign body of other part of head, initial encounter: Secondary | ICD-10-CM | POA: Insufficient documentation

## 2019-02-07 DIAGNOSIS — Z23 Encounter for immunization: Secondary | ICD-10-CM | POA: Insufficient documentation

## 2019-02-07 DIAGNOSIS — J449 Chronic obstructive pulmonary disease, unspecified: Secondary | ICD-10-CM | POA: Diagnosis not present

## 2019-02-07 DIAGNOSIS — Y939 Activity, unspecified: Secondary | ICD-10-CM | POA: Diagnosis not present

## 2019-02-07 LAB — BASIC METABOLIC PANEL
Anion gap: 11 (ref 5–15)
BUN: 11 mg/dL (ref 8–23)
CO2: 32 mmol/L (ref 22–32)
Calcium: 9.9 mg/dL (ref 8.9–10.3)
Chloride: 101 mmol/L (ref 98–111)
Creatinine, Ser: 0.66 mg/dL (ref 0.44–1.00)
GFR calc Af Amer: >60 mL/min (ref >60)
GFR calc non Af Amer: >60 mL/min (ref >60)
Glucose, Bld: 137 mg/dL — ABNORMAL HIGH (ref 70–99)
Potassium: 3.6 mmol/L (ref 3.5–5.1)
Sodium: 144 mmol/L (ref 135–145)

## 2019-02-07 LAB — URINALYSIS, ROUTINE W REFLEX MICROSCOPIC
Bilirubin Urine: NEGATIVE
Glucose, UA: NEGATIVE mg/dL
Ketones, ur: NEGATIVE mg/dL
Nitrite: NEGATIVE
Protein, ur: 30 mg/dL — AB
RBC / HPF: >50 RBC/hpf — ABNORMAL HIGH (ref 0–5)
Specific Gravity, Urine: 1.018 (ref 1.005–1.030)
pH: 5 (ref 5.0–8.0)

## 2019-02-07 LAB — CBC
HCT: 40.9 % (ref 36.0–46.0)
Hemoglobin: 13 g/dL (ref 12.0–15.0)
MCH: 31.8 pg (ref 26.0–34.0)
MCHC: 31.8 g/dL (ref 30.0–36.0)
MCV: 100 fL (ref 80.0–100.0)
Platelets: 202 10*3/uL (ref 150–400)
RBC: 4.09 MIL/uL (ref 3.87–5.11)
RDW: 11.9 % (ref 11.5–15.5)
WBC: 5.2 10*3/uL (ref 4.0–10.5)
nRBC: 0 % (ref 0.0–0.2)

## 2019-02-07 LAB — CBG MONITORING, ED
Glucose-Capillary: 105 mg/dL — ABNORMAL HIGH (ref 70–99)
Glucose-Capillary: 137 mg/dL — ABNORMAL HIGH (ref 70–99)

## 2019-02-07 LAB — TROPONIN I (HIGH SENSITIVITY): Troponin I (High Sensitivity): 11 ng/L (ref <18)

## 2019-02-07 MED ORDER — SODIUM CHLORIDE 0.9% FLUSH: 3.0000 mL | Freq: Once | INTRAVENOUS | Status: DC

## 2019-02-07 MED ORDER — TETANUS-DIPHTH-ACELL PERTUSSIS 5-2.5-18.5 LF-MCG/0.5 IM SUSP
0.5000 mL | Freq: Once | INTRAMUSCULAR | Status: AC
  Administered 2019-02-07: 0.5 mL via INTRAMUSCULAR
  Filled 2019-02-07: qty 0.5

## 2019-02-07 MED ORDER — CEPHALEXIN 250 MG PO CAPS: 250.0000 mg | ORAL_CAPSULE | Freq: Four times a day (QID) | ORAL | 0 refills | Status: DC

## 2019-02-07 NOTE — Discharge Instructions (Signed)
Take the antibiotics as prescribed.  Follow-up with your doctor to be rechecked as we discussed to consider further evaluation.  Return to the ED for recurrent symptoms.
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
## 2019-02-07 NOTE — ED Provider Notes (Signed)
[facility] EMERGENCY DEPARTMENT Provider Note   CSN: [id number] Arrival date & time: 02/07/19  1419     History Chief Complaint  Patient presents with  . Loss of Consciousness    Cathy Hensley is a 79 y.o. female.  HPI   Pt was in her car yesterday to go to the store.  She made it to the store but had a syncopal episode.  She remembers waking up in between the seats.  She felt lightheaded and dizzy.  She had some nausea.  She did hit her head and sustained a small laceration.  She noticed blood.  She waited a while and then was able to drive home.  She was feeling dizzy and lightheaded.  Pt decided to go to her doctor the next day so she did not come for treatment. She called her doctor today who told her to come to the ED.   She feels fine today. No fevers.  No CP.  No numbness or weakness.   She has never had a syncopal episode before.  Past Medical History:  Diagnosis Date  . Alpha-1-antitrypsin deficiency carrier   . COPD 07/13/2006  . HYPERTENSION 07/13/2006  . MENOPAUSAL SYNDROME 07/13/2006  . Other and unspecified hyperlipidemia 07/13/2006  . Respiratory failure (HCC)    Chronic hypoxic    Patient Active Problem List   Diagnosis Date Noted  . CAP (community acquired pneumonia) 12/07/2016  . 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 01/22/2015  . Chronic respiratory failure with hypoxia and hypercapnia (HCC) 09/24/2014  . COPD exacerbation (HCC)   . Dyslipidemia 05/03/2011  . Other and unspecified hyperlipidemia 07/13/2006  . Essential hypertension 07/13/2006  . COPD GOLD IV  07/13/2006    Past Surgical History:  Procedure Laterality Date  . TONSILLECTOMY       OB History   No obstetric history on file.     Family History  Problem Relation Age of Onset  . AAA (abdominal aortic aneurysm) Mother   . Emphysema Mother   . Aneurysm Sister        brain  . Emphysema Father   . Lymphoma Sister     Social History   Tobacco Use  . Smoking  status: Former Smoker    Packs/day: 0.50    Years: 42.00    Pack years: 21.00    Types: Cigarettes    Quit date: 09/23/2000    Years since quitting: 18.3  . Smokeless tobacco: Never Used  Substance Use Topics  . Alcohol use: Yes    Alcohol/week: 1.0 standard drinks    Types: 1 Standard drinks or equivalent per week    Comment: 1 glass of wine nearly daily.  . Drug use: No    Home Medications Prior to Admission medications   Medication Sig Start Date End Date Taking? Authorizing Provider  acetaminophen (TYLENOL) 500 MG tablet Take 500 mg by mouth every 6 (six) hours as needed.    [provider]  albuterol (PROVENTIL HFA;VENTOLIN HFA) 108 (90 Base) MCG/ACT inhaler INHALE 2 PUFFS INTO THE LUNGS EVERY 6 HOURS AS NEEDED FOR WHEEZING OR SHORTNESS OF BREATH 03/20/18   Nyoka Cowden, MD  amLODipine (NORVASC) 5 MG tablet Take 1 tablet (5 mg total) by mouth daily. 09/15/18   Deeann Saint, MD  arformoterol (BROVANA) 15 MCG/2ML NEBU Take 2 mLs (15 mcg total) by nebulization 2 (two) times daily. 09/15/18   Deeann Saint, MD  aspirin 81 MG chewable tablet Chew 1 tablet (81  mg total) by mouth daily. 09/07/14   Lonia Blood, MD  budesonide (PULMICORT) 0.25 MG/2ML nebulizer solution Take 2 mLs (0.25 mg total) by nebulization 2 (two) times daily. 09/15/18   Deeann Saint, MD  cephALEXin (KEFLEX) 250 MG capsule Take 1 capsule (250 mg total) by mouth 4 (four) times daily. 02/07/19   Linwood Dibbles, MD  montelukast (SINGULAIR) 10 MG tablet Take 1 tablet (10 mg total) by mouth at bedtime. 09/15/18   Deeann Saint, MD  Multiple Vitamins-Minerals (CENTRUM SILVER PO) Take 1 tablet by mouth daily.    [provider]  OXYGEN 2.5 lpm 24/7 (she uses 4lpm pulsed when on POC)    [provider]  Respiratory Therapy Supplies (FLUTTER) DEVI 1 Device by Does not apply route as needed. 12/15/16   Nyoka Cowden, MD  simvastatin (ZOCOR) 20 MG tablet Take 1 tablet (20 mg total) by mouth  at bedtime. 09/15/18   Deeann Saint, MD  Tiotropium Bromide Monohydrate (SPIRIVA RESPIMAT) 2.5 MCG/ACT AERS Inhale 2 puffs into the lungs daily. 09/22/18   Parrett, Virgel Bouquet, NP  Tiotropium Bromide Monohydrate (SPIRIVA RESPIMAT) 2.5 MCG/ACT AERS Inhale 2 puffs into the lungs daily. 01/15/19   Nyoka Cowden, MD    Allergies    Patient has no known allergies.  Review of Systems   Review of Systems  All other systems reviewed and are negative.   Physical Exam Updated Vital Signs BP (!) 166/84 (BP Location: Left Arm)   Pulse 91   Temp 98.6 F (37 C) (Oral)   Resp 14   SpO2 91%   Physical Exam Vitals and nursing note reviewed.  Constitutional:      General: She is not in acute distress.    Appearance: She is well-developed.  HENT:     Head: Normocephalic and atraumatic.     Right Ear: External ear normal.     Left Ear: External ear normal.  Eyes:     General: No scleral icterus.       Right eye: No discharge.        Left eye: No discharge.     Conjunctiva/sclera: Conjunctivae normal.  Neck:     Trachea: No tracheal deviation.  Cardiovascular:     Rate and Rhythm: Normal rate and regular rhythm.  Pulmonary:     Effort: Pulmonary effort is normal. No respiratory distress.     Breath sounds: Normal breath sounds. No stridor. No wheezing or rales.  Abdominal:     General: Bowel sounds are normal. There is no distension.     Palpations: Abdomen is soft.     Tenderness: There is no abdominal tenderness. There is no guarding or rebound.  Musculoskeletal:        General: No tenderness.     Cervical back: Neck supple.  Skin:    General: Skin is warm and dry.     Findings: No rash.  Neurological:     Mental Status: She is alert and oriented to person, place, and time.     Cranial Nerves: No cranial nerve deficit (No facial droop, extraocular movements intact, tongue midline ).     Sensory: No sensory deficit.     Motor: No abnormal muscle tone or seizure activity.      Coordination: Coordination normal.     Comments: No pronator drift bilateral upper extrem, able to hold both legs off bed for 5 seconds, sensation intact in all extremities, no visual field cuts, no left or  right sided neglect, normal finger-nose exam bilaterally, no nystagmus noted      ED Results / Procedures / Treatments   Labs (all labs ordered are listed, but only abnormal results are displayed) Labs Reviewed  BASIC METABOLIC PANEL - Abnormal; Notable for the following components:      Result Value   Glucose, Bld 137 (*)    All other components within normal limits  URINALYSIS, ROUTINE W REFLEX MICROSCOPIC - Abnormal; Notable for the following components:   APPearance HAZY (*)    Hgb urine dipstick LARGE (*)    Protein, ur 30 (*)    Leukocytes,Ua MODERATE (*)    RBC / HPF >50 (*)    Bacteria, UA FEW (*)    Non Squamous Epithelial 0-5 (*)    All other components within normal limits  CBG MONITORING, ED - Abnormal; Notable for the following components:   Glucose-Capillary 137 (*)    All other components within normal limits  CBG MONITORING, ED - Abnormal; Notable for the following components:   Glucose-Capillary 105 (*)    All other components within normal limits  URINE CULTURE  CBC  TROPONIN I (HIGH SENSITIVITY)    EKG EKG Interpretation  Date/Time:  Wednesday February 07 2019 17:04:12 EST Ventricular Rate:  73 PR Interval:    QRS Duration: 146 QT Interval:  419 QTC Calculation: 462 R Axis:   -88 Text Interpretation: Sinus rhythm Nonspecific IVCD with LAD Left ventricular hypertrophy Anterior Q waves, possibly due to LVH No significant change since last tracing Confirmed by Dorie Rank [phone]) on 02/07/2019 5:07:21 PM   Radiology DG Chest Portable 1 View  Result Date: 02/07/2019 CLINICAL DATA:  Syncope EXAM: PORTABLE CHEST 1 VIEW COMPARISON:  01/09/2018 FINDINGS: Atherosclerotic calcification of the aortic arch. Emphysema noted. Mild biapical pleuroparenchymal  scarring, stable. Bony demineralization. No blunting of the costophrenic angles. No acute findings. IMPRESSION: 1. No acute findings. 2. Aortic Atherosclerosis (ICD10-I70.0) and Emphysema (ICD10-J43.9). 3. Bony demineralization Electronically Signed   By: Van Clines M.D.   On: 02/07/2019 18:11    Procedures Procedures (including critical care time)  Medications Ordered in ED Medications  sodium chloride flush (NS) 0.9 % injection 3 mL (has no administration in time range)  Tdap (BOOSTRIX) injection 0.5 mL (has no administration in time range)    ED Course  I have reviewed the triage vital signs and the nursing notes.  Pertinent labs & imaging results that were available during my care of the patient were reviewed by me and considered in my medical decision making (see chart for details).  Clinical Course as of Feb 07 1836  Wed Feb 07, 2019  [place] reviewed.  Electrolyte panel and CBC unremarkable.  Troponin normal.   [JK]  1825 Urinalysis suggest possible UTI.   [PF]  7902 Chest x-ray without acute findings.   [JK]    Clinical Course User Index [JK] Dorie Rank, MD   MDM Rules/Calculators/A&P               NIH Stroke Scale: 0       Patient presented to the ED for evaluation after a syncopal episode yesterday.  Patient has felt well since yesterday.  Her physical exam is reassuring.  No focal neurologic deficits.  NIH stroke scale is 0.  Patient's not having any chest pain or shortness of breath.  Labs do not show evidence of acute kidney injury or anemia.  Patient is mildly hypertensive but I do not think that is  clinically significant.  Patient does have evidence of urinary tract infection although that is not necessarily related to her syncopal episode yesterday.  Patient appears stable for outpatient follow-up.  Consider cardiac Holter monitoring.  Warning signs and precautions discussed. Final Clinical Impression(s) / ED Diagnoses  Final diagnoses:  Syncope and  collapse  Acute cystitis with hematuria    Rx / DC Orders ED Discharge Orders         Ordered    cephALEXin (KEFLEX) 250 MG capsule  4 times daily     02/07/19 1836           Linwood Dibbles, MD 02/07/19 [place]

## 2019-02-07 NOTE — Telephone Encounter (Signed)
Spoke with pt state that she has an incident where she passed out in her car at the parking lot of [employer] which is a  few miles from  her house. Pt state that she does not remember how long it was before she regained consciousness but she recalls waking up with her head on the shift gear of her car, pt state that she waited for a while and drove back home. Pt state that she discovered blood on her right side of her face which was from a cut on top of her head, pt reported being nouseous and was wabbly. Pt state that she did not feel like she needed to go to the ED, state that she is still lightheaded but checked her BP reading this morning and it was 139/61.Pt is on continuous  Oxygen at home. Pt denies any headaches, slur speech,weaknes on upper and lower extremities. Pt advised to call 911/ or go to the ED for further Evaluation per Dr Salomon Fick, Pt verbalized understanding state that the husband will drive her to the ED. Notes sent to Dr Salomon Fick

## 2019-02-07 NOTE — ED Notes (Addendum)
Micro called. No urine culture received from triage.  Pt made aware of need for sample. Water given.

## 2019-02-07 NOTE — ED Notes (Signed)
Gave pt a urine cup to collect urine, pt stated "will get urine when pt needed to urinate".

## 2019-02-07 NOTE — ED Notes (Addendum)
Pt discharge and follow up education provided. Pt verbalizes understanding. Pt is alert and oriented x 4 at discharge.  

## 2019-02-08 LAB — URINE CULTURE: Culture: <10000 — AB

## 2019-02-09 ENCOUNTER — Telehealth: Payer: Self-pay | Admitting: Family Medicine

## 2019-02-09 NOTE — Telephone Encounter (Signed)
Please advise 

## 2019-02-09 NOTE — Telephone Encounter (Signed)
Pt is on an antibotic (sephalexim) for 7 days (she is on her second day) due to the blood in her Urine. She is scheduled for the COVID vaccine March the 2nd. She wants to know if she is ok to get the vaccine while being on the antibiotic because they may have a cancellation for an earlier date to get the vaccine.   Pt can be reached at [phone]

## 2019-02-10 DIAGNOSIS — J449 Chronic obstructive pulmonary disease, unspecified: Secondary | ICD-10-CM | POA: Diagnosis not present

## 2019-02-12 NOTE — Telephone Encounter (Signed)
Pt called back because her Covid vaccine has been moved up to Thursday and she wants to make sure it will be ok for her to get it.

## 2019-02-13 ENCOUNTER — Telehealth: Payer: Self-pay | Admitting: Family Medicine

## 2019-02-13 NOTE — Telephone Encounter (Signed)
Pt call and want a return call she stated that she had falling and hit her head and was put on some antibiotics and is sch to take her covid shot this Thursday. She want to know what to do.

## 2019-02-13 NOTE — Telephone Encounter (Signed)
Spoke with pt states that she wants advise on if she should get her 1st COVID-19 which is scheduled for Thursday, pt has been on Antibiotics for her UTI and will be taking her last dose on Wednsday. Please Advise

## 2019-02-13 NOTE — Telephone Encounter (Signed)
Spoke with pt advised that I will call pt back tomorrow when Dr Salomon Fick get back to the office in the morning

## 2019-02-14 DIAGNOSIS — J449 Chronic obstructive pulmonary disease, unspecified: Secondary | ICD-10-CM | POA: Diagnosis not present

## 2019-02-14 NOTE — Telephone Encounter (Signed)
Responded to previous message

## 2019-02-14 NOTE — Telephone Encounter (Signed)
If pt is not feeling dizzy or having any other signs of acute illness and UTI symptoms have resolved then she can get the COVID vaccine.  If note she will need to postpone it until symptoms resolve.

## 2019-02-14 NOTE — Telephone Encounter (Signed)
Spoke with pt state that she feels great and that will be going to get the COVID-19 vaccine tomorrow

## 2019-02-21 ENCOUNTER — Ambulatory Visit: Payer: Medicare Other | Admitting: Adult Health

## 2019-03-07 ENCOUNTER — Other Ambulatory Visit: Payer: Self-pay

## 2019-03-08 ENCOUNTER — Encounter: Payer: Self-pay | Admitting: Family Medicine

## 2019-03-08 ENCOUNTER — Ambulatory Visit (INDEPENDENT_AMBULATORY_CARE_PROVIDER_SITE_OTHER): Payer: Medicare Other | Admitting: Family Medicine

## 2019-03-08 VITALS — BP 136/68 | HR 86 | Temp 97.7°F | Wt 99.0 lb

## 2019-03-08 DIAGNOSIS — R55 Syncope and collapse: Secondary | ICD-10-CM | POA: Diagnosis not present

## 2019-03-08 DIAGNOSIS — L720 Epidermal cyst: Secondary | ICD-10-CM

## 2019-03-08 DIAGNOSIS — J449 Chronic obstructive pulmonary disease, unspecified: Secondary | ICD-10-CM | POA: Diagnosis not present

## 2019-03-08 NOTE — Patient Instructions (Addendum)
Epidermal Cyst  An epidermal cyst is a sac made of skin tissue. The sac contains a substance called keratin. Keratin is a protein that is normally secreted through the hair follicles. When keratin becomes trapped in the top layer of skin (epidermis), it can form an epidermal cyst. Epidermal cysts can be found anywhere on your body. These cysts are usually harmless (benign), and they may not cause symptoms unless they become infected. What are the causes? This condition may be caused by:  A blocked hair follicle.  A hair that curls and re-enters the skin instead of growing straight out of the skin (ingrown hair).  A blocked pore.  Irritated skin.  An injury to the skin.  Certain conditions that are passed along from parent to child (inherited).  Human papillomavirus (HPV).  Long-term (chronic) sun damage to the skin. What increases the risk? The following factors may make you more likely to develop an epidermal cyst:  Having acne.  Being overweight.  Being 30-40 years old. What are the signs or symptoms? The only symptom of this condition may be a small, painless lump underneath the skin. When an epidermal cyst ruptures, it may become infected. Symptoms may include:  Redness.  Inflammation.  Tenderness.  Warmth.  Fever.  Keratin draining from the cyst. Keratin is grayish-white, bad-smelling substance.  Pus draining from the cyst. How is this diagnosed? This condition is diagnosed with a physical exam.  In some cases, you may have a sample of tissue (biopsy) taken from your cyst to be examined under a microscope or tested for bacteria.  You may be referred to a health care provider who specializes in skin care (dermatologist). How is this treated? In many cases, epidermal cysts go away on their own without treatment. If a cyst becomes infected, treatment may include:  Opening and draining the cyst, done by a health care provider. After draining, minor surgery to  remove the rest of the cyst may be done.  Antibiotic medicine.  Injections of medicines (steroids) that help to reduce inflammation.  Surgery to remove the cyst. Surgery may be done if the cyst: ? Becomes large. ? Bothers you. ? Has a chance of turning into cancer.  Do not try to open a cyst yourself. Follow these instructions at home:  Take over-the-counter and prescription medicines only as told by your health care provider.  If you were prescribed an antibiotic medicine, take it it as told by your health care provider. Do not stop using the antibiotic even if you start to feel better.  Keep the area around your cyst clean and dry.  Wear loose, dry clothing.  Avoid touching your cyst.  Check your cyst every day for signs of infection. Check for: ? Redness, swelling, or pain. ? Fluid or blood. ? Warmth. ? Pus or a bad smell.  Keep all follow-up visits as told by your health care provider. This is important. How is this prevented?  Wear clean, dry, clothing.  Avoid wearing tight clothing.  Keep your skin clean and dry. Take showers or baths every day. Contact a health care provider if:  Your cyst develops symptoms of infection.  Your condition is not improving or is getting worse.  You develop a cyst that looks different from other cysts you have had.  You have a fever. Get help right away if:  Redness spreads from the cyst into the surrounding area. Summary  An epidermal cyst is a sac made of skin tissue. These cysts are   usually harmless (benign), and they may not cause symptoms unless they become infected.  If a cyst becomes infected, treatment may include surgery to open and drain the cyst, or to remove it. Treatment may also include medicines by mouth or through an injection.  Take over-the-counter and prescription medicines only as told by your health care provider. If you were prescribed an antibiotic medicine, take it as told by your health care  provider. Do not stop using the antibiotic even if you start to feel better.  Contact a health care provider if your condition is not improving or is getting worse.  Keep all follow-up visits as told by your health care provider. This is important. This information is not intended to replace advice given to you by your health care provider. Make sure you discuss any questions you have with your health care provider. Document Revised: 04/13/2018 Document Reviewed: 07/04/2017 Elsevier Patient Education  2020 [employer]  Urinary Tract Infection, Adult  A urinary tract infection (UTI) is an infection of any part of the urinary tract. The urinary tract includes the kidneys, ureters, bladder, and urethra. These organs make, store, and get rid of urine in the body. Your health care provider may use other names to describe the infection. An upper UTI affects the ureters and kidneys (pyelonephritis). A lower UTI affects the bladder (cystitis) and urethra (urethritis). What are the causes? Most urinary tract infections are caused by bacteria in your genital area, around the entrance to your urinary tract (urethra). These bacteria grow and cause inflammation of your urinary tract. What increases the risk? You are more likely to develop this condition if:  You have a urinary catheter that stays in place (indwelling).  You are not able to control when you urinate or have a bowel movement (you have incontinence).  You are female and you: ? Use a spermicide or diaphragm for birth control. ? Have low estrogen levels. ? Are pregnant.  You have certain genes that increase your risk (genetics).  You are sexually active.  You take antibiotic medicines.  You have a condition that causes your flow of urine to slow down, such as: ? An enlarged prostate, if you are female. ? Blockage in your urethra (stricture). ? A kidney stone. ? A nerve condition that affects your bladder control (neurogenic  bladder). ? Not getting enough to drink, or not urinating often.  You have certain medical conditions, such as: ? Diabetes. ? A weak disease-fighting system (immunesystem). ? Sickle cell disease. ? Gout. ? Spinal cord injury. What are the signs or symptoms? Symptoms of this condition include:  Needing to urinate right away (urgently).  Frequent urination or passing small amounts of urine frequently.  Pain or burning with urination.  Blood in the urine.  Urine that smells bad or unusual.  Trouble urinating.  Cloudy urine.  Vaginal discharge, if you are female.  Pain in the abdomen or the lower back. You may also have:  Vomiting or a decreased appetite.  Confusion.  Irritability or tiredness.  A fever.  Diarrhea. The first symptom in older adults may be confusion. In some cases, they may not have any symptoms until the infection has worsened. How is this diagnosed? This condition is diagnosed based on your medical history and a physical exam. You may also have other tests, including:  Urine tests.  Blood tests.  Tests for sexually transmitted infections (STIs). If you have had more than one UTI, a cystoscopy or imaging studies may be  done to determine the cause of the infections. How is this treated? Treatment for this condition includes:  Antibiotic medicine.  Over-the-counter medicines to treat discomfort.  Drinking enough water to stay hydrated. If you have frequent infections or have other conditions such as a kidney stone, you may need to see a health care provider who specializes in the urinary tract (urologist). In rare cases, urinary tract infections can cause sepsis. Sepsis is a life-threatening condition that occurs when the body responds to an infection. Sepsis is treated in the hospital with IV antibiotics, fluids, and other medicines. Follow these instructions at home:  Medicines  Take over-the-counter and prescription medicines only as told  by your health care provider.  If you were prescribed an antibiotic medicine, take it as told by your health care provider. Do not stop using the antibiotic even if you start to feel better. General instructions  Make sure you: ? Empty your bladder often and completely. Do not hold urine for long periods of time. ? Empty your bladder after sex. ? Wipe from front to back after a bowel movement if you are female. Use each tissue one time when you wipe.  Drink enough fluid to keep your urine pale yellow.  Keep all follow-up visits as told by your health care provider. This is important. Contact a health care provider if:  Your symptoms do not get better after 1-2 days.  Your symptoms go away and then return. Get help right away if you have:  Severe pain in your back or your lower abdomen.  A fever.  Nausea or vomiting. Summary  A urinary tract infection (UTI) is an infection of any part of the urinary tract, which includes the kidneys, ureters, bladder, and urethra.  Most urinary tract infections are caused by bacteria in your genital area, around the entrance to your urinary tract (urethra).  Treatment for this condition often includes antibiotic medicines.  If you were prescribed an antibiotic medicine, take it as told by your health care provider. Do not stop using the antibiotic even if you start to feel better.  Keep all follow-up visits as told by your health care provider. This is important. This information is not intended to replace advice given to you by your health care provider. Make sure you discuss any questions you have with your health care provider. Document Revised: 12/08/2017 Document Reviewed: 06/30/2017 Elsevier Patient Education  2020 [employer]  Dehydration Dehydration is a condition in which there is not enough water or other fluids in the body. This happens when a person loses more fluids than he or she takes in. Important organs, such as the  kidneys, brain, and heart, cannot function without a proper amount of fluids. Any loss of fluids from the body can lead to dehydration. People 43 years of age or older have a higher risk of dehydration than younger adults. This is because in older age, the body:  Is less able to maintain the right amount of water.  Does not respond to temperature changes as well.  Does not get a sense of thirst as easily or quickly. Dehydration can be mild, moderate, or severe. It should be treated right away to prevent it from becoming severe. What are the causes? Dehydration may be caused by:  Conditions that cause loss of water or other fluids, such as diarrhea, vomiting, or sweating or urinating a lot.  Not drinking enough fluids, especially when you are ill or doing activities that require a lot of  energy.  Other illnesses and conditions, such as fever or infection.  Certain medicines, such as medicines that remove excess fluid from the body (diuretics).  Lack of safe drinking water.  Not being able to get enough water and food. What increases the risk? This condition is more likely to develop in people who:  Have a long-term (chronic) illness, such as: ? An illness that may cause you to urinate more, such as diabetes. ? Kidney, heart, or lung disease that have not been treated properly. ? A disease of the brain and nervous system or a disorder that affects your thinking and emotions, such as dementia.  Are 65 years or older.  Have a disability.  Live in a place that is high in altitude, where thinner, drier air causes you to have more fluid loss. What are the signs or symptoms? Symptoms of dehydration depend on how severe it is. Mild or moderate dehydration  Thirst.  Dry lips or dry mouth.  Dizziness or light-headedness, especially when standing up from a seated position.  Muscle cramps.  Dark urine. Urine may be the color of tea.  Less urine or tears produced than  usual.  Headache. Severe dehydration  Changes in skin. Your skin may be cold and clammy, blotchy, or pale. Your skin also may not return to normal after being lightly pinched and released.  Little or no tears, urine, or sweat.  Changes in vital signs, such as rapid breathing and low blood pressure. Your pulse may be weak or may be faster than 100 beats a minute when you are sitting still.  Other changes, such as: ? Feeling very thirsty. ? Sunken eyes. ? Cold hands and feet. ? Confusion. ? Being very tired (lethargic) or having trouble waking from sleep. ? Short-term weight loss. ? Loss of consciousness. How is this diagnosed? This condition is diagnosed based on your symptoms and a physical exam. Blood and urine tests may help confirm the diagnosis. How is this treated? Treatment for this condition depends on how severe it is. Treatment should be started right away. Do not wait until dehydration becomes severe. Severe dehydration is an emergency and needs to be treated in a hospital.  Mild or moderate dehydration can often be treated at home. You may be asked to: ? Drink more fluids. ? Drink an oral rehydration solution (ORS). This drink helps restore proper amounts of fluids and salts and minerals in the blood (electrolytes).  Severe dehydration can be treated: ? With IV fluids. ? By correcting abnormal levels of electrolytes. This is often done by giving electrolytes through a tube that is passed through your nose and into your stomach (nasogastric tube, or NG tube). ? By treating the underlying cause of dehydration. Follow these instructions at home: Oral rehydration solution If told by your health care provider, drink an ORS:  Make an ORS by following instructions on the package.  Start by drinking small amounts, about  cup (120 mL) every 5-10 minutes.  Slowly increase how much you drink until you have taken the amount recommended by your health care provider. Eating and  drinking         Drink enough clear fluid to keep your urine pale yellow. If you were told to drink an ORS, finish the ORS first and then start slowly drinking other clear fluids. Drink fluids such as: ? Water. Do not drink only water. Doing that can lead to hyponatremia, which is having too little salt (sodium) in the body. ?  Water from ice chips you suck on. ? Fruit juice that you have added water to (diluted fruit juice). ? Low-calorie sports drinks.  Eat foods that contain a healthy balance of electrolytes, such as bananas, oranges, potatoes, tomatoes, and spinach.  Do not drink alcohol.  Avoid the following: ? Drinks that contain a lot of sugar. These include high-calorie sports drinks, fruit juice that is not diluted, and soda. ? Caffeine. ? Foods that are greasy or contain a lot of fat or sugar. General instructions  Take over-the-counter and prescription medicines only as told by your health care provider.  Do not take sodium tablets. This can lead to having too much sodium in the body (hypernatremia).  Return to your normal activities as told by your health care provider. Ask your health care provider what activities are safe for you.  Keep all follow-up visits as told by your health care provider. This is important. Contact a health care provider if you:  Have pain in your abdomen and the pain gets worse or stays in one area (localizes).  Have a rash.  Have a stiff neck.  Are more irritable than usual.  Are sleepier or have a harder time waking than usual.  Feel weak or dizzy.  Are very thirsty. Get help right away if you have:  Any symptoms of severe dehydration.  A fever.  A severe headache.  Symptoms of vomiting, such as: ? Your vomiting gets worse or does not go away. ? Your vomit includes blood or green matter (bile). ? You cannot eat or drink without vomiting.  Problems with urination or bowel movements, such as: ? Diarrhea that gets worse or  does not go away. ? Blood in your stool (feces). This may cause stool to look black and tarry. ? Not urinating, or urinating only a small amount of very dark urine, within 6-8 hours.  Trouble breathing.  Symptoms that get worse with treatment. These symptoms may represent a serious problem that is an emergency. Do not wait to see if the symptoms will go away. Get medical help right away. Call your local emergency services (911 in the [place] Do not drive yourself to the hospital. Summary  Dehydration is a condition in which there is not enough water or other fluids in the body. This happens when a person loses more fluids than he or she takes in.  Treatment for this condition depends on the severity. Treatment should be started right away. Do not wait until dehydration becomes severe.  Drink enough clear fluid to keep your urine pale yellow. If you were told to drink an oral rehydration solution (ORS), finish the ORS first and then start slowly drinking other clear fluids.  Take over-the-counter and prescription medicines only as told by your health care provider.  Get help right away if you have any symptoms of severe dehydration. This information is not intended to replace advice given to you by your health care provider. Make sure you discuss any questions you have with your health care provider. Document Revised: 08/03/2018 Document Reviewed: 08/03/2018 Elsevier Patient Education  [place].

## 2019-03-08 NOTE — Progress Notes (Signed)
Subjective:    Patient ID: Cathy Hensley, female    DOB: 01/15/1940, 79 y.o.   MRN: [id number]  No chief complaint on file.   HPI Patient was seen today for follow-up.  Pt doing well since syncopal episode in car on 02/06/2019.  Seen in ED 2/3, treated for UTI with Keflex .  As pt was asymptomatic she has not driven as she is afraid it may happen again.  Patient is working on increasing her intake of water.  Denies SOB.  Using oxygen.  Patient mentions having a bump on posterior neck present times years.  Area is not painful, nonpruritic, without drainage or erythema.  Past Medical History:  Diagnosis Date  . Alpha-1-antitrypsin deficiency carrier   . COPD 07/13/2006  . HYPERTENSION 07/13/2006  . MENOPAUSAL SYNDROME 07/13/2006  . Other and unspecified hyperlipidemia 07/13/2006  . Respiratory failure (HCC)    Chronic hypoxic    No Known Allergies  ROS General: Denies fever, chills, night sweats, changes in weight, changes in appetite HEENT: Denies headaches, ear pain, changes in vision, rhinorrhea, sore throat CV: Denies CP, palpitations, SOB, orthopnea  + COPD Pulm: Denies SOB, cough, wheezing GI: Denies abdominal pain, nausea, vomiting, diarrhea, constipation GU: Denies dysuria, hematuria, frequency, vaginal discharge Msk: Denies muscle cramps, joint pains Neuro: Denies weakness, numbness, tingling Skin: Denies rashes, bruising  + bump posterior neck Psych: Denies depression, anxiety, hallucinations     Objective:    Blood pressure 136/68, pulse 86, temperature 97.7 F (36.5 C), temperature source Temporal, weight 99 lb (44.9 kg), SpO2 96 %.   Gen. Pleasant, well-nourished, in no distress, normal affect, using portable oxygen with [place] in place HEENT: [place]/AT, face symmetric, no scleral icterus, PERRLA, EOMI, nares patent without drainag Lungs: no accessory muscle use, CTAB, no wheezes or rales Cardiovascular: RRR, no m/r/g, no peripheral edema Musculoskeletal: Clubbing noted of  fingers.  no deformities, no cyanosis. Normal tone Neuro:  A&Ox3, CN II-XII intact, normal gait Skin:  Warm, no rash.  1 cm smooth, slightly firm, mobile mass on posterior neck near hairline without erythema, induration, or drainage.  No fluctuance noted   Wt Readings from Last 3 Encounters:  03/08/19 99 lb (44.9 kg)  09/21/18 104 lb (47.2 kg)  01/09/18 106 lb (48.1 kg)    Lab Results  Component Value Date   WBC 5.2 02/07/2019   HGB 13.0 02/07/2019   HCT 40.9 02/07/2019   PLT 202 02/07/2019   GLUCOSE 137 (H) 02/07/2019   CHOL 183 09/28/2016   TRIG 79.0 09/28/2016   HDL 77.00 09/28/2016   LDLDIRECT 127.9 10/19/2006   LDLCALC 90 09/28/2016   ALT 17 09/28/2016   AST 23 09/28/2016   NA 144 02/07/2019   K 3.6 02/07/2019   CL 101 02/07/2019   CREATININE 0.66 02/07/2019   BUN 11 02/07/2019   CO2 32 02/07/2019   TSH 0.90 09/28/2016    Assessment/Plan:  COPD GOLD IV -Stable -Continue regular oxygen use -Continue albuterol inhaler as needed, Brovana nebulizer as needed, Pulmicort nebulizer solution as needed, and Spiriva Respimat -Continue to avoid smoking.   -Continue follow-up with pulmonology  Epidermal cyst -Discussed removal options.  Pt considering -Given handout  Syncope, unspecified syncope type -Currently stable -Question if related to UTI as labs and studies in ED were normal. -Discussed the importance of increasing p.o. intake of water and fluids -Consider Holter monitor for current symptoms as well as labs including TSH -We will continue to monitor  F/u as needed in  1 month  Abbe Amsterdam, MD

## 2019-03-09 ENCOUNTER — Encounter: Payer: Self-pay | Admitting: Family Medicine

## 2019-03-09 DIAGNOSIS — R55 Syncope and collapse: Secondary | ICD-10-CM | POA: Insufficient documentation

## 2019-03-10 DIAGNOSIS — J449 Chronic obstructive pulmonary disease, unspecified: Secondary | ICD-10-CM | POA: Diagnosis not present

## 2019-03-19 DIAGNOSIS — J449 Chronic obstructive pulmonary disease, unspecified: Secondary | ICD-10-CM | POA: Diagnosis not present

## 2019-03-21 ENCOUNTER — Ambulatory Visit: Payer: Medicare Other | Admitting: Internal Medicine

## 2019-03-21 ENCOUNTER — Other Ambulatory Visit: Payer: Self-pay

## 2019-03-21 ENCOUNTER — Ambulatory Visit: Payer: Medicare Other | Admitting: Adult Health

## 2019-03-21 ENCOUNTER — Encounter: Payer: Self-pay | Admitting: Adult Health

## 2019-03-21 ENCOUNTER — Telehealth: Payer: Self-pay | Admitting: Adult Health

## 2019-03-21 DIAGNOSIS — E440 Moderate protein-calorie malnutrition: Secondary | ICD-10-CM | POA: Diagnosis not present

## 2019-03-21 DIAGNOSIS — J9611 Chronic respiratory failure with hypoxia: Secondary | ICD-10-CM | POA: Diagnosis not present

## 2019-03-21 DIAGNOSIS — J449 Chronic obstructive pulmonary disease, unspecified: Secondary | ICD-10-CM

## 2019-03-21 DIAGNOSIS — J9612 Chronic respiratory failure with hypercapnia: Secondary | ICD-10-CM

## 2019-03-21 MED ORDER — SPIRIVA RESPIMAT 2.5 MCG/ACT IN AERS: 2.0000 | INHALATION_SPRAY | Freq: Every day | RESPIRATORY_TRACT | 0 refills | Status: DC

## 2019-03-21 NOTE — Assessment & Plan Note (Signed)
Cont on oxygen to keep o2 sats >88-90%.

## 2019-03-21 NOTE — Patient Instructions (Addendum)
Continue on Spiriva daily  Continue on Brovana and Pulmicort Neb Twice daily   Continue on Singulair daily .  ProAir 2 puffs every 4hrs as needed wheezing .  Continue on 2.5L/m oxygen . (Pulsing device 4l/m )  Follow up with [name]  Or Marissa Weaver NP in 6 months and As needed

## 2019-03-21 NOTE — Progress Notes (Signed)
@Patient  ID: , female    DOB: Jul 30, 1940, 79 y.o.   MRN: [id number]  Chief Complaint  Patient presents with  . 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 No focal opacity or mass appreciated. No pleural effusion or thickening appreciated. Heart normal in size. Mediastinum normal in contour.  CARDIAC TTE (09/04/14): LV normal in size. Moderate LVH. EF 60-65%. No regional wall motion abnormalities. LA & RA normal in size. RV normal in size and function. Pulmonary artery systolic pressure 30 mmHg. No aortic stenosis or regurgitation. Normal aortic root. Mild mitral regurgitation. No pulmonic regurgitation. Trivial tricuspid regurgitation. Trivial pericardial effusion.  MICROBIOLOGY Sputum (09/06/14): Oral flora Urine Strep Ag (09/06/14): Negative Urine Legionella Ag (09/06/14): Negative Influenza A/B PCR (09/06/14): Negative   LABS 11/06/14 Alpha-1 antitrypsin: MZ (88)  09/06/14 ABG on 3 L/m: 7.40 / 65 / 63 / 91% CBC: 11.5/14.2/46.3/240 BMP: 140/4.4/99/41/19/0.38/118/9.2 LFT: 3.7/6.3/0.5/61/20/21  03/21/2019 Followup ; COPD , O2 RF  Patient returns for a 22-month follow-up.  Patient has underlying COPD.  She remains on Pulmicort and Brovana nebulizers twice daily.  She is on Spiriva daily.  She is oxygen dependent at home on 2.5 L. Denies increased  oxygen demands.   She does have a portable oxygen concentrator that she uses on for pulsed oxygen.  She says overall her breathing is doing okay, no increased cough or wheezing .  She received her second Covid vaccine yesterday.  Activity level remains about the same but is aggravated that she can not do as much as she would like to do.  We discussed light exercises as tolerated to build her strength and endurance.     No Known Allergies  Immunization History  Administered Date(s) Administered  . Fluad Quad(high Dose 65+) 09/21/2018  . Influenza Split 10/05/2010, 11/15/2012  . Influenza, High Dose Seasonal PF 09/21/2013, 10/01/2014, 09/06/2017  . Influenza,inj,Quad PF,6+ Mos 08/27/2016  . Influenza-Unspecified 08/24/2015  . PFIZER SARS-COV-2 Vaccination 03/20/2019  . Pneumococcal Conjugate-13 01/19/2013  . Pneumococcal Polysaccharide-23 01/04/2005, 03/19/2010  . Td 09/05/2003  . Tdap 06/29/2010, 02/07/2019    Past Medical History:  Diagnosis Date  . Alpha-1-antitrypsin deficiency carrier   . COPD 07/13/2006  . HYPERTENSION 07/13/2006  . MENOPAUSAL SYNDROME 07/13/2006  . Other and unspecified hyperlipidemia 07/13/2006  . Respiratory failure (HCC)    Chronic hypoxic    Tobacco History: Social History   Tobacco Use  Smoking Status Former Smoker  . Packs/day: 0.50  . Years: 42.00  . Pack years: 21.00  . Types: Cigarettes  . Quit date: 09/23/2000  . Years since quitting: 18.5  Smokeless Tobacco Never Used   Counseling given: Not Answered   Outpatient Medications Prior to Visit  Medication Sig Dispense Refill  . acetaminophen (TYLENOL) 500 MG tablet Take 500 mg by mouth every 6 (six) hours as needed.    09/25/2000 albuterol (PROVENTIL  HFA;VENTOLIN HFA) 108 (90 Base) MCG/ACT inhaler INHALE 2 PUFFS INTO THE LUNGS EVERY 6 HOURS AS NEEDED FOR WHEEZING OR SHORTNESS OF BREATH 8.5 g 3  . amLODipine (NORVASC) 5 MG tablet Take 1 tablet (5 mg total) by mouth daily. 90 tablet 3  . arformoterol  (BROVANA) 15 MCG/2ML NEBU Take 2 mLs (15 mcg total) by nebulization 2 (two) times daily. 120 mL 12  . aspirin 81 MG chewable tablet Chew 1 tablet (81 mg total) by mouth daily.    . budesonide (PULMICORT) 0.25 MG/2ML nebulizer solution Take 2 mLs (0.25 mg total) by nebulization 2 (two) times daily. 120 mL 12  . montelukast (SINGULAIR) 10 MG tablet Take 1 tablet (10 mg total) by mouth at bedtime. 90 tablet 3  . Multiple Vitamins-Minerals (CENTRUM SILVER PO) Take 1 tablet by mouth daily.    . OXYGEN 2.5 lpm 24/7 (she uses 4lpm pulsed when on POC)    . Respiratory Therapy Supplies (FLUTTER) DEVI 1 Device by Does not apply route as needed. 1 each 0  . simvastatin (ZOCOR) 20 MG tablet Take 1 tablet (20 mg total) by mouth at bedtime. 90 tablet 3  . Tiotropium Bromide Monohydrate (SPIRIVA RESPIMAT) 2.5 MCG/ACT AERS Inhale 2 puffs into the lungs daily. 24 g 0  . cephALEXin (KEFLEX) 250 MG capsule Take 1 capsule (250 mg total) by mouth 4 (four) times daily. (Patient not taking: Reported on 03/21/2019) 28 capsule 0  . Tiotropium Bromide Monohydrate (SPIRIVA RESPIMAT) 2.5 MCG/ACT AERS Inhale 2 puffs into the lungs daily. (Patient not taking: Reported on 03/21/2019) 4 g 11   No facility-administered medications prior to visit.     Review of Systems:   Constitutional:   No  weight loss, night sweats,  Fevers, chills, fatigue, or  lassitude.  HEENT:   No headaches,  Difficulty swallowing,  Tooth/dental problems, or  Sore throat,                No sneezing, itching, ear ache, nasal congestion, post nasal drip,   CV:  No chest pain,  Orthopnea, PND, swelling in lower extremities, anasarca, dizziness, palpitations, syncope.   GI  No heartburn, indigestion, abdominal pain, nausea, vomiting, diarrhea, change in bowel habits, loss of appetite, bloody stools.   Resp:   No chest wall deformity  Skin: no rash or lesions.  GU: no dysuria, change in color of urine, no urgency or frequency.  No flank pain, no  hematuria   MS:  No joint pain or swelling.  No decreased range of motion.  No back pain.    Physical Exam  BP (!) 136/58 (BP Location: Left Arm, Cuff Size: Normal)   Pulse 74   Temp (!) 97 F (36.1 C) (Temporal)   Ht 5\' 5"  (1.651 m)   Wt 104 lb 12.8 oz (47.5 kg)   SpO2 95%   BMI 17.44 kg/m   GEN: A/Ox3; pleasant , NAD, thin    HEENT:  [place]/AT,   NOSE-clear, THROAT-clear, no lesions, no postnasal drip or exudate noted.   NECK:  Supple w/ fair ROM; no JVD; normal carotid impulses w/o bruits; no thyromegaly or nodules palpated; no lymphadenopathy.    RESP  Clear  P & A; w/o, wheezes/ rales/ or rhonchi. no accessory muscle use, no dullness to percussion  CARD:  RRR, no m/r/g, no peripheral edema, pulses intact, no cyanosis or clubbing.  GI:   Soft & nt; nml bowel sounds; no organomegaly or masses detected.   Musco: Warm bil,  no deformities or joint swelling noted.   Neuro: alert, no focal deficits noted.    Skin: Warm, no lesions or rashes    Lab Results:  CBC    Component Value Date/Time   WBC 5.2 02/07/2019 1434   RBC 4.09 02/07/2019 1434   HGB 13.0 02/07/2019 1434   HCT 40.9 02/07/2019 1434   PLT 202 02/07/2019 1434   MCV 100.0 02/07/2019 1434   MCH 31.8 02/07/2019 1434   MCHC 31.8 02/07/2019 1434   RDW 11.9 02/07/2019 1434   LYMPHSABS 1.6 09/28/2016 1101   MONOABS 0.4 09/28/2016 1101   EOSABS 0.1 09/28/2016 1101   BASOSABS 0.0 09/28/2016 1101    BMET    Component Value Date/Time   NA 144 02/07/2019 1434   K 3.6 02/07/2019 1434   CL 101 02/07/2019 1434   CO2 32 02/07/2019 1434   GLUCOSE 137 (H) 02/07/2019 1434   BUN 11 02/07/2019 1434   CREATININE 0.66 02/07/2019 1434   CALCIUM 9.9 02/07/2019 1434   GFRNONAA >60 02/07/2019 1434   GFRAA >60 02/07/2019 1434    BNP    Component Value Date/Time   BNP 115.6 (H) 09/03/2014 1707    ProBNP No results found for: PROBNP  Imaging: No results found.    PFT Results Latest Ref Rng & Units  01/22/2015 11/05/2014  FVC-Pre L 1.58 1.52  FVC-Predicted Pre % 53 51  FVC-Post L 1.63 1.60  FVC-Predicted Post % 55 54  Pre FEV1/FVC % % 37 32  Post FEV1/FCV % % 36 35  FEV1-Pre L 0.58 0.49  FEV1-Predicted Pre % 26 22  FEV1-Post L 0.59 0.56  DLCO UNC% % 27 27  DLCO COR %Predicted % 47 42    No results found for: NITRICOXIDE      Assessment & Plan:   COPD GOLD IV  Stable on present regimen  Activity as tolerated  Plan  Patient Instructions  Continue on Spiriva daily  Continue on Brovana and Pulmicort Neb Twice daily   Continue on Singulair daily .  ProAir 2 puffs every 4hrs as needed wheezing .  Continue on 2.5L/m oxygen . (Pulsing device 4l/m )  Follow up with Dr. Melvyn Novas  Or Navin Dogan NP in 6 months and As needed       Chronic respiratory failure with hypoxia and hypercapnia ([place]) Cont on oxygen to keep o2 sats >88-90%.    Protein calorie malnutrition ([place]) Recommend high protein diet      Rexene Edison, NP 03/21/2019

## 2019-03-21 NOTE — Addendum Note (Signed)
Addended by: Charlott Holler on: 03/21/2019 11:19 AM   Modules accepted: Orders

## 2019-03-21 NOTE — Telephone Encounter (Signed)
Called and spoke with Patient.  Covid vaccines updated in Patient's chart.  Nothing further at this time.

## 2019-03-21 NOTE — Assessment & Plan Note (Signed)
Recommend high-protein diet 

## 2019-03-21 NOTE — Assessment & Plan Note (Signed)
Stable on present regimen  Activity as tolerated  Plan  Patient Instructions  Continue on Spiriva daily  Continue on Brovana and Pulmicort Neb Twice daily   Continue on Singulair daily .  ProAir 2 puffs every 4hrs as needed wheezing .  Continue on 2.5L/m oxygen . (Pulsing device 4l/m )  Follow up with [name]  Or Haddy Mullinax NP in 6 months and As needed

## 2019-04-17 DIAGNOSIS — J449 Chronic obstructive pulmonary disease, unspecified: Secondary | ICD-10-CM | POA: Diagnosis not present

## 2019-05-16 DIAGNOSIS — J449 Chronic obstructive pulmonary disease, unspecified: Secondary | ICD-10-CM | POA: Diagnosis not present

## 2019-05-21 ENCOUNTER — Telehealth: Payer: Self-pay | Admitting: Family Medicine

## 2019-05-21 NOTE — Telephone Encounter (Signed)
Spoke with pt scheduled pt for a telephone visit with Dr Salomon Fick tomorrow, pt will come by the office and give a urine sample for her visit

## 2019-05-21 NOTE — Telephone Encounter (Signed)
Patient had a UTI several months ago and has never fully recovered from the hospital visit.  Now today she has blood in her urine again. The patient stated that she is not going back to the hospital and wants to know if Dr. Salomon Fick can just call her in something.  Please Advise

## 2019-05-22 ENCOUNTER — Telehealth: Payer: Medicare Other | Admitting: Family Medicine

## 2019-05-22 ENCOUNTER — Telehealth (INDEPENDENT_AMBULATORY_CARE_PROVIDER_SITE_OTHER): Payer: Medicare Other | Admitting: Family Medicine

## 2019-05-22 ENCOUNTER — Encounter: Payer: Self-pay | Admitting: Family Medicine

## 2019-05-22 DIAGNOSIS — Z87891 Personal history of nicotine dependence: Secondary | ICD-10-CM

## 2019-05-22 DIAGNOSIS — R319 Hematuria, unspecified: Secondary | ICD-10-CM

## 2019-05-22 DIAGNOSIS — R2689 Other abnormalities of gait and mobility: Secondary | ICD-10-CM | POA: Diagnosis not present

## 2019-05-22 LAB — POC URINALSYSI DIPSTICK (AUTOMATED)
Bilirubin, UA: NEGATIVE
Glucose, UA: NEGATIVE
Ketones, UA: NEGATIVE
Leukocytes, UA: NEGATIVE
Nitrite, UA: NEGATIVE
Protein, UA: POSITIVE — AB
Spec Grav, UA: 1.015 (ref 1.010–1.025)
Urobilinogen, UA: 0.2 E.U./dL
pH, UA: 7.5 (ref 5.0–8.0)

## 2019-05-22 NOTE — Progress Notes (Signed)
Virtual Visit via Telephone Note  I connected with Cathy Hensley on 05/22/19 at  4:00 PM EDT by telephone and verified that I am speaking with the correct person using two identifiers.   I discussed the limitations, risks, security and privacy concerns of performing an evaluation and management service by telephone and the availability of in person appointments. I also discussed with the patient that there may be a patient responsible charge related to this service. The patient expressed understanding and agreed to proceed.  Location patient: home Location provider: work or home office Participants present for the call: patient, provider Patient did not have a visit in the prior 7 days to address this/these issue(s).   History of Present Illness: Pt is a 79 yo female with pmh sig for HTN, COPD, alpha-1 antitrypsin deficiency carrier, HLD, h/o tobacco use who was seen for acute concern.  Pt endorses feeling off balance since Feb after syncopal episode 2/2 UTI.  Pt not having to hold on to anything when walking, but has to "go slow".  If she she lays down then stands up fast she notices more of a dizzy sensation.  Pt noticed blood on toilet tissue with wiping yesterday am.  Denies dysuria, suprapubic pain, back pain, n/v, fever.  Trying to drink more water as she was concerned she was having another UTI.  Pt has been afraid to drive ever since syncopal episode in Feb.   Observations/Objective: Patient sounds cheerful and well on the phone. I do not appreciate any SOB. Speech and thought processing are grossly intact. Patient reported vitals:  Assessment and Plan: Hematuria, unspecified type  -UA with 3+ RBCs.  Negative for acute UTI. -discussed need for further work up given h/o tobacco use -Referral to Urology placed.   -consider additional labs including CBC.  - Plan: POCT Urinalysis Dipstick (Automated), Urine Microscopic Only, Ambulatory referral to Urology, Urine Culture  Balance  problem -discussed orthostatic hypotension. -Encouraged to increase p.o. hydration -Discussed taking her time when moving from laying, sitting, or standing position -consider PT.  Will place order.  History of tobacco use -h/o 21 pk yrs.   -Quit 2002? -continue to monitor   Follow Up Instructions: F/u prn   I did not refer this patient for an OV in the next 24 hours for this/these issue(s).  I discussed the assessment and treatment plan with the patient. The patient was provided an opportunity to ask questions and all were answered. The patient agreed with the plan and demonstrated an understanding of the instructions.   The patient was advised to call back or seek an in-person evaluation if the symptoms worsen or if the condition fails to improve as anticipated.  I provided 13 minutes of non-face-to-face time during this encounter.   Deeann Saint, MD

## 2019-05-23 LAB — URINALYSIS, MICROSCOPIC ONLY

## 2019-05-23 LAB — URINE CULTURE
MICRO NUMBER:: 10490775
SPECIMEN QUALITY:: ADEQUATE

## 2019-06-06 DIAGNOSIS — R310 Gross hematuria: Secondary | ICD-10-CM | POA: Diagnosis not present

## 2019-06-06 DIAGNOSIS — R350 Frequency of micturition: Secondary | ICD-10-CM | POA: Diagnosis not present

## 2019-06-11 DIAGNOSIS — N200 Calculus of kidney: Secondary | ICD-10-CM | POA: Diagnosis not present

## 2019-06-11 DIAGNOSIS — R310 Gross hematuria: Secondary | ICD-10-CM | POA: Diagnosis not present

## 2019-06-18 DIAGNOSIS — J449 Chronic obstructive pulmonary disease, unspecified: Secondary | ICD-10-CM | POA: Diagnosis not present

## 2019-06-27 ENCOUNTER — Encounter: Payer: Self-pay | Admitting: Physical Therapy

## 2019-06-27 ENCOUNTER — Other Ambulatory Visit: Payer: Self-pay

## 2019-06-27 ENCOUNTER — Ambulatory Visit: Payer: Medicare Other | Attending: Family Medicine | Admitting: Physical Therapy

## 2019-06-27 DIAGNOSIS — R2681 Unsteadiness on feet: Secondary | ICD-10-CM | POA: Insufficient documentation

## 2019-06-27 NOTE — Patient Instructions (Signed)
Access Code: [email or web address] URL: [email or web address] Date: 06/27/2019 Prepared by: Raynelle Fanning  Exercises Single Leg Stance with Support - 1 x daily - 7 x weekly - 5 reps - 1 sets - max hold Tandem Stance with Support - 2 x daily - 7 x weekly - 5 reps - 1 sets - max hold

## 2019-06-27 NOTE — Therapy (Signed)
[facility] [address], [address] [place], [place], [address] Phone: [phone]   Fax:  [phone]  Physical Therapy Evaluation and Discharge Summary  Patient Details  Name: Cathy Hensley MRN: [id number] Date of Birth: 08/29/1940 Referring Provider (PT): Abbe Amsterdam MD   Encounter Date: 06/27/2019   PT End of Session - 06/27/19 1150    Visit Number 1    Date for PT Re-Evaluation One time viist   Authorization Type [employer] Medicare    PT Start Time 1151    PT Stop Time 1230    PT Time Calculation (min) 39 min    Activity Tolerance Patient tolerated treatment well    Behavior During Therapy [facility] for tasks assessed/performed           Past Medical History:  Diagnosis Date  . Alpha-1-antitrypsin deficiency carrier   . COPD 07/13/2006  . HYPERTENSION 07/13/2006  . MENOPAUSAL SYNDROME 07/13/2006  . Other and unspecified hyperlipidemia 07/13/2006  . Respiratory failure (HCC)    Chronic hypoxic    Past Surgical History:  Procedure Laterality Date  . TONSILLECTOMY      There were no vitals filed for this visit.    Subjective Assessment - 06/27/19 1154    Subjective In Feb 2021, patient passed out in her parked car. She was diagnosed with UTI and she reports she has been "wobbly" ever since. Uses oxygen level 79ml continuous.    Pertinent History COPD, chronic respitory failure, HTN    Patient Stated Goals to get answers              [name] PT Assessment - 06/27/19 0001      Assessment   Medical Diagnosis balance problem    Referring Provider (PT) Abbe Amsterdam MD    Onset Date/Surgical Date 02/05/19    Prior Therapy no      Precautions   Precautions None      Restrictions   Weight Bearing Restrictions No      Balance Screen   Has the patient fallen in the past 6 months No    Has the patient had a decrease in activity level because of a fear of falling?  No    Is the patient reluctant to leave their home  because of a fear of falling?  No      Home [employer] information [employer] residence    Living Arrangements Spouse/significant other    Additional Comments townhome; 2 story      Prior Function   Level of Independence Independent    Vocation Retired      ROM / Strength   AROM / PROM / Strength Strength      Strength   Overall Strength Comments bil hip flex 4+/5, bil hip ADD 4+/5       Flexibility   Soft Tissue Assessment /Muscle Length --   [facility]     Standardized Balance Assessment   Standardized Balance Assessment Berg Balance Test;Five Times Sit to Stand;Timed Up and Go Test    Five times sit to stand comments  8 sec      Berg Balance Test   Sit to Stand Able to stand without using hands and stabilize independently    Standing Unsupported Able to stand safely 2 minutes    Sitting with Back Unsupported but Feet Supported on Floor or Stool Able to sit safely and securely 2 minutes    Stand to Sit Sits safely with minimal use of  hands    Transfers Able to transfer safely, minor use of hands    Standing Unsupported with Eyes Closed Able to stand 10 seconds safely    Standing Unsupported with Feet Together Able to place feet together independently and stand 1 minute safely    From Standing, Reach Forward with Outstretched Arm Can reach forward >12 cm safely (5")    From Standing Position, Pick up Object from Floor Able to pick up shoe safely and easily    From Standing Position, Turn to Look Behind Over each Shoulder Looks behind from both sides and weight shifts well    Turn 360 Degrees Able to turn 360 degrees safely in 4 seconds or less    Standing Unsupported, Alternately Place Feet on Step/Stool Able to stand independently and safely and complete 8 steps in 20 seconds    Standing Unsupported, One Foot in Front Able to plae foot ahead of the other independently and hold 30 seconds    Standing on One Leg Able to lift leg independently and hold 5-10 seconds   right leg <  3 sec   Total Score 53      Timed Up and Go Test   Normal TUG (seconds) 10                      Objective measurements completed on examination: See above findings.               PT Education - 06/27/19 1324    Education Details HEP    Person(s) Educated Patient    Methods Explanation;Demonstration;Handout    Comprehension Verbalized understanding;Returned demonstration            PT Short Term Goals - 06/27/19 1324      PT SHORT TERM GOAL #1   Title Ind with HEP    Time 1    Period Days    Status Achieved                     Plan - 06/27/19 1225    Clinical Impression Statement Patient presents for PT due to having unsteady gait since having a UTI in Feb 2021. She denies any falls. She has good LE strength and demostrates no LOB with gait in the clinic. Her BERG was 53/56, TUG = 10 sec and 5x sit to stand = 8 sec. Patient had mild deficits with SLS and tandem stance and these activities were issued for HEP as patient did not feel she needed to return for more visits.    Stability/Clinical Decision Making Stable/Uncomplicated    Clinical Decision Making Low    Rehab Potential Excellent    PT Frequency One time visit    PT Treatment/Interventions Balance training;Neuromuscular re-education    PT Next Visit Plan one time visit    PT Home Exercise Plan [phone]    Consulted and Agree with Plan of Care Patient           Patient will benefit from skilled therapeutic intervention in order to improve the following deficits and impairments:     Visit Diagnosis: Unsteadiness on feet - Plan: PT plan of care cert/re-cert     Problem List Patient Active Problem List   Diagnosis Date Noted  . Syncope 03/09/2019  . CAP (community acquired pneumonia) 12/07/2016  . 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 01/22/2015  . Chronic respiratory failure with hypoxia and hypercapnia (HCC) 09/24/2014  . Protein calorie malnutrition (HCC)   .  COPD exacerbation ([place])   . Dyslipidemia 05/03/2011  . Other and unspecified hyperlipidemia 07/13/2006  . Essential hypertension 07/13/2006  . COPD GOLD IV  07/13/2006    Madelyn Flavors PT 06/27/2019, 1:28 PM  [facility] [address], [place], [place], [address] Phone: [phone]   Fax:  [phone]  Name: AVALIE OCONNOR MRN: [id number] Date of Birth: [birth year]

## 2019-07-13 DIAGNOSIS — R350 Frequency of micturition: Secondary | ICD-10-CM | POA: Diagnosis not present

## 2019-07-13 DIAGNOSIS — R310 Gross hematuria: Secondary | ICD-10-CM | POA: Diagnosis not present

## 2019-07-16 DIAGNOSIS — J449 Chronic obstructive pulmonary disease, unspecified: Secondary | ICD-10-CM | POA: Diagnosis not present

## 2019-08-15 DIAGNOSIS — J449 Chronic obstructive pulmonary disease, unspecified: Secondary | ICD-10-CM | POA: Diagnosis not present

## 2019-08-22 ENCOUNTER — Telehealth: Payer: Self-pay | Admitting: Internal Medicine

## 2019-08-22 NOTE — Telephone Encounter (Signed)
ATC, left VM requested a return call.

## 2019-08-23 MED ORDER — SPIRIVA RESPIMAT 2.5 MCG/ACT IN AERS: 2.0000 | INHALATION_SPRAY | Freq: Every day | RESPIRATORY_TRACT | 0 refills | Status: DC

## 2019-08-23 NOTE — Telephone Encounter (Signed)
Called and spoke with Patient.  Patient requested Spiriva 2.5 samples.  Spiriva samples placed at front desk for pick up.  Nothing further at this time.

## 2019-08-23 NOTE — Telephone Encounter (Signed)
Pt returning a phone call. Pt can be reached at [phone].

## 2019-09-11 ENCOUNTER — Other Ambulatory Visit: Payer: Self-pay

## 2019-09-11 ENCOUNTER — Telehealth: Payer: Self-pay | Admitting: Family Medicine

## 2019-09-11 ENCOUNTER — Other Ambulatory Visit: Payer: Self-pay | Admitting: Family Medicine

## 2019-09-11 DIAGNOSIS — J449 Chronic obstructive pulmonary disease, unspecified: Secondary | ICD-10-CM

## 2019-09-11 MED ORDER — BUDESONIDE 0.25 MG/2ML IN SUSP: RESPIRATORY_TRACT | 11 refills | Status: DC

## 2019-09-11 NOTE — Telephone Encounter (Signed)
Pt Rx faxed to [facility] per pt request

## 2019-09-11 NOTE — Telephone Encounter (Signed)
Pt was told by the pharmacy to call for the refill   Medication Refill:  Oxygen   Pharmacy: [facility] - [place], [place] - [address] Phone:  [phone]  Fax:  [phone]

## 2019-09-14 DIAGNOSIS — J449 Chronic obstructive pulmonary disease, unspecified: Secondary | ICD-10-CM | POA: Diagnosis not present

## 2019-09-18 ENCOUNTER — Telehealth: Payer: Self-pay | Admitting: Family Medicine

## 2019-09-18 NOTE — Telephone Encounter (Signed)
Pt is calling in stating that she did not receive her Rx BROVANA 15 MCG she stated that the pharmacy is still waiting to receive a new RX for it.  Pt would like to have a call back when this has been sent to the pharmacy.

## 2019-09-19 ENCOUNTER — Other Ambulatory Visit: Payer: Self-pay

## 2019-09-19 DIAGNOSIS — J449 Chronic obstructive pulmonary disease, unspecified: Secondary | ICD-10-CM

## 2019-09-19 MED ORDER — ARFORMOTEROL TARTRATE 15 MCG/2ML IN NEBU: INHALATION_SOLUTION | RESPIRATORY_TRACT | 11 refills | Status: DC

## 2019-09-20 ENCOUNTER — Telehealth: Payer: Self-pay | Admitting: Family Medicine

## 2019-09-20 NOTE — Telephone Encounter (Signed)
Rod Holler w/[facility] is calling in stating that they have received the order for the pts nebulizer but for her BROVANA the quanity is incorrect it s/b 50 instead of what was sent in.

## 2019-09-21 ENCOUNTER — Ambulatory Visit: Payer: Medicare Other | Admitting: Adult Health

## 2019-09-21 ENCOUNTER — Encounter: Payer: Self-pay | Admitting: Adult Health

## 2019-09-21 ENCOUNTER — Other Ambulatory Visit: Payer: Self-pay

## 2019-09-21 VITALS — BP 110/60 | HR 72 | Temp 97.2°F | Ht 65.0 in | Wt 93.8 lb

## 2019-09-21 DIAGNOSIS — Z23 Encounter for immunization: Secondary | ICD-10-CM

## 2019-09-21 DIAGNOSIS — J9611 Chronic respiratory failure with hypoxia: Secondary | ICD-10-CM | POA: Diagnosis not present

## 2019-09-21 DIAGNOSIS — J449 Chronic obstructive pulmonary disease, unspecified: Secondary | ICD-10-CM | POA: Diagnosis not present

## 2019-09-21 DIAGNOSIS — J9612 Chronic respiratory failure with hypercapnia: Secondary | ICD-10-CM

## 2019-09-21 MED ORDER — ARFORMOTEROL TARTRATE 15 MCG/2ML IN NEBU: INHALATION_SOLUTION | RESPIRATORY_TRACT | 11 refills | Status: DC

## 2019-09-21 MED ORDER — ALBUTEROL SULFATE HFA 108 (90 BASE) MCG/ACT IN AERS: INHALATION_SPRAY | RESPIRATORY_TRACT | 3 refills | Status: DC

## 2019-09-21 NOTE — Progress Notes (Signed)
@Patient  ID: , female    DOB: January 10, 1940, 79 y.o.   MRN: [id number]  Chief Complaint  Patient presents with  . 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 No focal opacity or mass appreciated. No pleural effusion or thickening appreciated. Heart normal in size. Mediastinum normal in contour.  CARDIAC TTE (09/04/14): LV normal in size. Moderate LVH. EF 60-65%. No regional wall motion abnormalities. LA & RA normal in size. RV normal in size and function. Pulmonary artery systolic pressure 30 mmHg. No aortic stenosis or regurgitation. Normal aortic root. Mild mitral regurgitation. No pulmonic regurgitation. Trivial tricuspid regurgitation. Trivial pericardial effusion.  MICROBIOLOGY Sputum (09/06/14): Oral flora Urine Strep Ag (09/06/14): Negative Urine Legionella Ag (09/06/14): Negative Influenza A/B PCR (09/06/14): Negative   LABS 11/06/14 Alpha-1 antitrypsin: MZ (88)  09/06/14 ABG on 3 L/m: 7.40 / 65 / 63 / 91% CBC: 11.5/14.2/46.3/240 BMP: 140/4.4/99/41/19/0.38/118/9.2 LFT: 3.7/6.3/0.5/61/20/21  09/21/2019 Follow up : COPD and O2 RF  Patient presents for a 75-month follow-up.  She has underlying severe COPD that is oxygen dependent.  She remains on Pulmicort and Brovana nebulizers twice daily.  She is on Spiriva daily.  She uses oxygen continuously  at 2.5 L.  She says overall she has been doing well without any flare of her cough or wheezing.  She is had no increased oxygen mans.  She has received both of her Covid vaccines.  We discussed flu shot today. Weight has dropped some, we discussed high protein diet.     No Known Allergies  Immunization History  Administered Date(s) Administered  . Fluad Quad(high Dose 65+) 09/21/2018  . Influenza Split 10/05/2010, 11/15/2012  . Influenza, High Dose Seasonal PF 09/21/2013, 10/01/2014, 09/06/2017  . Influenza,inj,Quad PF,6+ Mos 08/27/2016  . Influenza-Unspecified 08/24/2015  . PFIZER SARS-COV-2 Vaccination 03/03/2019, 03/20/2019  . Pneumococcal Conjugate-13 01/19/2013  . Pneumococcal Polysaccharide-23 01/04/2005, 03/19/2010  . Td 09/05/2003  . Tdap 06/29/2010, 02/07/2019    Past Medical History:  Diagnosis Date  . Alpha-1-antitrypsin deficiency carrier   . COPD 07/13/2006  . HYPERTENSION 07/13/2006  . MENOPAUSAL SYNDROME 07/13/2006  . Other and unspecified hyperlipidemia 07/13/2006  . Respiratory failure (HCC)    Chronic hypoxic    Tobacco History: Social History   Tobacco Use  Smoking Status Former Smoker  . Packs/day: 0.50  . Years: 42.00  . Pack years: 21.00  . Types: Cigarettes  . Quit date: 09/23/2000  . Years since quitting: 19.0  Smokeless Tobacco Never Used   Counseling given: Not Answered   Outpatient Medications Prior to Visit  Medication Sig Dispense Refill  . acetaminophen (TYLENOL) 500 MG tablet Take 500 mg by mouth every 6 (six) hours as needed.    09/25/2000 albuterol (PROVENTIL HFA;VENTOLIN HFA) 108 (90 Base) MCG/ACT inhaler INHALE 2 PUFFS INTO THE LUNGS EVERY 6 HOURS AS NEEDED FOR WHEEZING OR SHORTNESS OF BREATH 8.5  g 3  . amLODipine (NORVASC) 5 MG tablet Take 1 tablet (5 mg total) by mouth daily. 90 tablet 3  . arformoterol (BROVANA) 15 MCG/2ML NEBU USE 1 VIAL  IN  NEBULIZER TWICE  DAILY - morning and evening 60 mL 11  . aspirin 81 MG chewable tablet Chew 1 tablet  (81 mg total) by mouth daily.    . budesonide (PULMICORT) 0.25 MG/2ML nebulizer solution USE 1 VIAL  IN  NEBULIZER TWICE  DAILY - rinse mouth after treatment 60 mL 11  . montelukast (SINGULAIR) 10 MG tablet Take 1 tablet (10 mg total) by mouth at bedtime. 90 tablet 3  . Multiple Vitamins-Minerals (CENTRUM SILVER PO) Take 1 tablet by mouth daily.    . OXYGEN 2.5 lpm 24/7 (she uses 4lpm pulsed when on POC)    . Respiratory Therapy Supplies (FLUTTER) DEVI 1 Device by Does not apply route as needed. 1 each 0  . simvastatin (ZOCOR) 20 MG tablet Take 1 tablet (20 mg total) by mouth at bedtime. 90 tablet 3  . Tiotropium Bromide Monohydrate (SPIRIVA RESPIMAT) 2.5 MCG/ACT AERS Inhale 2 puffs into the lungs daily. 4 g 11  . cephALEXin (KEFLEX) 250 MG capsule Take 1 capsule (250 mg total) by mouth 4 (four) times daily. 28 capsule 0  . Tiotropium Bromide Monohydrate (SPIRIVA RESPIMAT) 2.5 MCG/ACT AERS Inhale 2 puffs into the lungs daily. 24 g 0  . Tiotropium Bromide Monohydrate (SPIRIVA RESPIMAT) 2.5 MCG/ACT AERS Inhale 2 puffs into the lungs daily. 4 g 0   No facility-administered medications prior to visit.     Review of Systems:   Constitutional:   No  weight loss, night sweats,  Fevers, chills,  +fatigue, or  lassitude.  HEENT:   No headaches,  Difficulty swallowing,  Tooth/dental problems, or  Sore throat,                No sneezing, itching, ear ache, nasal congestion, post nasal drip,   CV:  No chest pain,  Orthopnea, PND, swelling in lower extremities, anasarca, dizziness, palpitations, syncope.   GI  No heartburn, indigestion, abdominal pain, nausea, vomiting, diarrhea, change in bowel habits, loss of appetite, bloody stools.   Resp:   No chest wall deformity  Skin: no rash or lesions.  GU: no dysuria, change in color of urine, no urgency or frequency.  No flank pain, no hematuria   MS:  No joint pain or swelling.  No decreased range of motion.  No back pain.    Physical Exam  BP  110/60 (BP Location: Left Arm, Cuff Size: Normal)   Pulse 72   Temp (!) 97.2 F (36.2 C) (Other (Comment)) Comment (Src): wrist  Ht 5\' 5"  (1.651 m)   Wt 93 lb 12.8 oz (42.5 kg)   SpO2 100%   BMI 15.61 kg/m   GEN: A/Ox3; pleasant , NAD, thin female on O2    HEENT:  [place]/AT,  , NOSE-clear, THROAT-clear, no lesions, no postnasal drip or exudate noted.   NECK:  Supple w/ fair ROM; no JVD; normal carotid impulses w/o bruits; no thyromegaly or nodules palpated; no lymphadenopathy.    RESP  Clear  P & A; w/o, wheezes/ rales/ or rhonchi. no accessory muscle use, no dullness to percussion  CARD:  RRR, no m/r/g, no peripheral edema, pulses intact, no cyanosis or clubbing.  GI:   Soft & nt; nml bowel sounds; no organomegaly or masses detected.   Musco: Warm bil, no deformities or joint swelling noted.  Neuro: alert, no focal deficits noted.    Skin: Warm, no lesions or rashes    Lab Results:  ProBNP No results found for: PROBNP  Imaging: No results found.    PFT Results Latest Ref Rng & Units 01/22/2015 11/05/2014  FVC-Pre L 1.58 1.52  FVC-Predicted Pre % 53 51  FVC-Post L 1.63 1.60  FVC-Predicted Post % 55 54  Pre FEV1/FVC % % 37 32  Post FEV1/FCV % % 36 35  FEV1-Pre L 0.58 0.49  FEV1-Predicted Pre % 26 22  FEV1-Post L 0.59 0.56  DLCO uncorrected ml/min/mmHg 6.92 6.85  DLCO UNC% % 27 27  DLVA Predicted % 47 42    No results found for: NITRICOXIDE      Assessment & Plan:   COPD GOLD IV  Controlled on current regimen  Flu shot today   Plan  Patient Instructions  Continue on Spiriva daily  Continue on Brovana and Pulmicort Neb Twice daily   Continue on Singulair daily .  ProAir 2 puffs every 4hrs as needed wheezing .  Activity as tolerated High-protein diet Continue on 2.5L/m oxygen . (Pulsing device 4l/m )  Follow up with [name]  Or Matylda Fehring NP in 6 months and As needed       Chronic respiratory failure with hypoxia and hypercapnia (HCC) Continue on  oxygen , no changes   Plan  Patient Instructions  Continue on Spiriva daily  Continue on Brovana and Pulmicort Neb Twice daily   Continue on Singulair daily .  ProAir 2 puffs every 4hrs as needed wheezing .  Activity as tolerated High-protein diet Continue on 2.5L/m oxygen . (Pulsing device 4l/m )  Follow up with [name]  Or Marielis Samara NP in 6 months and As needed          [name], NP 09/21/2019

## 2019-09-21 NOTE — Telephone Encounter (Signed)
Spoke with Gilda with [facility], advised the right quantity needed for pt  Brovana Rx.New Rx was faxed as requested 

## 2019-09-21 NOTE — Patient Instructions (Signed)
Continue on Spiriva daily  Continue on Brovana and Pulmicort Neb Twice daily   Continue on Singulair daily .  ProAir 2 puffs every 4hrs as needed wheezing .  Activity as tolerated High-protein diet Continue on 2.5L/m oxygen . (Pulsing device 4l/m )  Follow up with [name]  Or Solimar Maiden NP in 6 months and As needed

## 2019-09-21 NOTE — Assessment & Plan Note (Signed)
Controlled on current regimen  Flu shot today   Plan  Patient Instructions  Continue on Spiriva daily  Continue on Brovana and Pulmicort Neb Twice daily   Continue on Singulair daily .  ProAir 2 puffs every 4hrs as needed wheezing .  Activity as tolerated High-protein diet Continue on 2.5L/m oxygen . (Pulsing device 4l/m )  Follow up with [name]  Or Reagyn Facemire NP in 6 months and As needed

## 2019-09-21 NOTE — Addendum Note (Signed)
Addended by: Vianne Bulls R on: 09/21/2019 02:26 PM   Modules accepted: Orders

## 2019-09-21 NOTE — Telephone Encounter (Signed)
Spoke with Rod Holler with [facility], advised the right quantity needed for pt  Brovana Rx.New Rx was faxed as requested

## 2019-09-21 NOTE — Assessment & Plan Note (Signed)
Continue on oxygen , no changes   Plan  Patient Instructions  Continue on Spiriva daily  Continue on Brovana and Pulmicort Neb Twice daily   Continue on Singulair daily .  ProAir 2 puffs every 4hrs as needed wheezing .  Activity as tolerated High-protein diet Continue on 2.5L/m oxygen . (Pulsing device 4l/m )  Follow up with [name]  Or Elai Vanwyk NP in 6 months and As needed

## 2019-10-10 DIAGNOSIS — J449 Chronic obstructive pulmonary disease, unspecified: Secondary | ICD-10-CM | POA: Diagnosis not present

## 2019-10-15 DIAGNOSIS — J449 Chronic obstructive pulmonary disease, unspecified: Secondary | ICD-10-CM | POA: Diagnosis not present

## 2019-11-10 DIAGNOSIS — J449 Chronic obstructive pulmonary disease, unspecified: Secondary | ICD-10-CM | POA: Diagnosis not present

## 2019-11-13 DIAGNOSIS — J449 Chronic obstructive pulmonary disease, unspecified: Secondary | ICD-10-CM | POA: Diagnosis not present

## 2019-11-14 DIAGNOSIS — J449 Chronic obstructive pulmonary disease, unspecified: Secondary | ICD-10-CM | POA: Diagnosis not present

## 2019-12-10 DIAGNOSIS — J449 Chronic obstructive pulmonary disease, unspecified: Secondary | ICD-10-CM | POA: Diagnosis not present

## 2019-12-13 ENCOUNTER — Other Ambulatory Visit: Payer: Self-pay | Admitting: Family Medicine

## 2019-12-13 DIAGNOSIS — E782 Mixed hyperlipidemia: Secondary | ICD-10-CM

## 2019-12-13 DIAGNOSIS — I10 Essential (primary) hypertension: Secondary | ICD-10-CM

## 2019-12-13 DIAGNOSIS — J449 Chronic obstructive pulmonary disease, unspecified: Secondary | ICD-10-CM

## 2019-12-17 DIAGNOSIS — J449 Chronic obstructive pulmonary disease, unspecified: Secondary | ICD-10-CM | POA: Diagnosis not present

## 2020-01-10 DIAGNOSIS — J449 Chronic obstructive pulmonary disease, unspecified: Secondary | ICD-10-CM | POA: Diagnosis not present

## 2020-01-14 ENCOUNTER — Other Ambulatory Visit: Payer: Self-pay

## 2020-01-14 ENCOUNTER — Ambulatory Visit: Payer: Medicare Other

## 2020-01-14 ENCOUNTER — Other Ambulatory Visit: Payer: Medicare Other

## 2020-01-14 DIAGNOSIS — J449 Chronic obstructive pulmonary disease, unspecified: Secondary | ICD-10-CM | POA: Diagnosis not present

## 2020-01-14 DIAGNOSIS — Z20822 Contact with and (suspected) exposure to covid-19: Secondary | ICD-10-CM | POA: Diagnosis not present

## 2020-01-16 LAB — SARS-COV-2, NAA 2 DAY TAT

## 2020-01-16 LAB — NOVEL CORONAVIRUS, NAA: SARS-CoV-2, NAA: NOT DETECTED

## 2020-01-31 ENCOUNTER — Telehealth: Payer: Self-pay | Admitting: Family Medicine

## 2020-01-31 NOTE — Telephone Encounter (Signed)
No longer needed

## 2020-02-04 ENCOUNTER — Ambulatory Visit (INDEPENDENT_AMBULATORY_CARE_PROVIDER_SITE_OTHER): Payer: Medicare Other

## 2020-02-04 DIAGNOSIS — Z0000 Encounter for general adult medical examination without abnormal findings: Secondary | ICD-10-CM

## 2020-02-04 NOTE — Progress Notes (Signed)
Subjective:   Cathy Hensley is a 80 y.o. female who presents for an Initial Medicare Annual Wellness Visit.  I connected with Rite AidMaryJoan Hensley  today by telephone and verified that I am speaking with the correct person using two identifiers. Location patient: home Location provider: work Persons participating in the virtual visit: patient, provider.   I discussed the limitations, risks, security and privacy concerns of performing an evaluation and management service by telephone and the availability of in person appointments. I also discussed with the patient that there may be a patient responsible charge related to this service. The patient expressed understanding and verbally consented to this telephonic visit.    Interactive audio and video telecommunications were attempted between this provider and patient, however failed, due to patient having technical difficulties OR patient did not have access to video capability.  We continued and completed visit with audio only.      Review of Systems    N/A  Cardiac Risk Factors include: advanced age (>2755men, 1>65 women);hypertension;dyslipidemia     Objective:    Today's Vitals   There is no height or weight on file to calculate BMI.  Advanced Directives 02/04/2020 06/27/2019 02/07/2019 01/10/2015 09/24/2014 09/03/2014 09/03/2014  Does Patient Have a Medical Advance Directive? Yes Yes No Yes Yes No No  Type of Estate agentAdvance Directive Healthcare Power of [place];Living will Healthcare Power of [place];Living will - Healthcare Power of [place]orney;Living will Healthcare Power of [place];Living will - -  Does patient want to make changes to medical advance directive? No - Patient declined No - Patient declined - No - Patient declined - - -  Copy of Healthcare Power of Attorney in Chart? No - copy requested No - copy requested - No - copy requested - - -  Would patient like information on creating a medical advance directive? - - No - Patient declined - -  No - patient declined information No - patient declined information    Current Medications (verified) Outpatient Encounter Medications as of 02/04/2020  Medication Sig  . acetaminophen (TYLENOL) 500 MG tablet Take 500 mg by mouth every 6 (six) hours as needed.  Marland Kitchen. albuterol (VENTOLIN HFA) 108 (90 Base) MCG/ACT inhaler INHALE 2 PUFFS INTO THE LUNGS EVERY 6 HOURS AS NEEDED FOR WHEEZING OR SHORTNESS OF BREATH  . amLODipine (NORVASC) 5 MG tablet TAKE 1 TABLET BY MOUTH DAILY  . arformoterol (BROVANA) 15 MCG/2ML NEBU USE 1 VIAL  IN  NEBULIZER TWICE  DAILY - morning and evening  . aspirin 81 MG chewable tablet Chew 1 tablet (81 mg total) by mouth daily.  . budesonide (PULMICORT) 0.25 MG/2ML nebulizer solution USE 1 VIAL  IN  NEBULIZER TWICE  DAILY - rinse mouth after treatment  . dextromethorphan-guaiFENesin (MUCINEX DM) 30-600 MG 12hr tablet Take 1 tablet by mouth 2 (two) times daily.  . montelukast (SINGULAIR) 10 MG tablet TAKE 1 TABLET BY MOUTH AT BEDTIME  . Multiple Vitamins-Minerals (CENTRUM SILVER PO) Take 1 tablet by mouth daily.  . OXYGEN 2.5 lpm 24/7 (she uses 4lpm pulsed when on POC)  . Respiratory Therapy Supplies (FLUTTER) DEVI 1 Device by Does not apply route as needed.  . simvastatin (ZOCOR) 20 MG tablet TAKE 1 TABLET( 20 MG TOTAL) BY MOUTH AT BEDTIME  . Tiotropium Bromide Monohydrate (SPIRIVA RESPIMAT) 2.5 MCG/ACT AERS Inhale 2 puffs into the lungs daily.   No facility-administered encounter medications on file as of 02/04/2020.    Allergies (verified) Patient has no known allergies.   History:  Past Medical History:  Diagnosis Date  . Alpha-1-antitrypsin deficiency carrier   . COPD 07/13/2006  . HYPERTENSION 07/13/2006  . MENOPAUSAL SYNDROME 07/13/2006  . Other and unspecified hyperlipidemia 07/13/2006  . Respiratory failure (HCC)    Chronic hypoxic   Past Surgical History:  Procedure Laterality Date  . TONSILLECTOMY     Family History  Problem Relation Age of Onset  . AAA  (abdominal aortic aneurysm) Mother   . Emphysema Mother   . Aneurysm Sister        brain  . Emphysema Father   . Lymphoma Sister    Social History   Socioeconomic History  . Marital status: Married    Spouse name: Not on file  . Number of children: 2  . Years of education: Not on file  . Highest education level: Not on file  Occupational History  . Occupation: retired  Tobacco Use  . Smoking status: Former Smoker    Packs/day: 0.50    Years: 42.00    Pack years: 21.00    Types: Cigarettes    Quit date: 09/23/2000    Years since quitting: 19.3  . Smokeless tobacco: Never Used  Substance and Sexual Activity  . Alcohol use: Yes    Alcohol/week: 1.0 standard drink    Types: 1 Standard drinks or equivalent per week    Comment: 1 glass of wine nearly daily.  . Drug use: No  . Sexual activity: Not on file  Other Topics Concern  . Not on file  Social History Narrative   Lives at home with her husband. Retired.      [facility]:   She is originally from [place]. She has 2 daughters, one lives in [place] & the other in [place]. Previously worked Clinical cytogeneticist a Musician, ran a Lawyer, clerical work, Lawyer. She has no pets currently. No previous bird, mold, or hot tub exposure.    Social Determinants of Health   Financial Resource Strain: Low Risk   . Difficulty of Paying Living Expenses: Not hard at all  Food Insecurity: No Food Insecurity  . Worried About Programme researcher, broadcasting/film/video in the Last Year: Never true  . Ran Out of Food in the Last Year: Never true  Transportation Needs: No Transportation Needs  . Lack of Transportation (Medical): No  . Lack of Transportation (Non-Medical): No  Physical Activity: Inactive  . Days of Exercise per Week: 0 days  . Minutes of Exercise per Session: 0 min  Stress: No Stress Concern Present  . Feeling of Stress : Not at all  Social Connections: Moderately Integrated  . Frequency of Communication with Friends  and Family: More than three times a week  . Frequency of Social Gatherings with Friends and Family: More than three times a week  . Attends Religious Services: More than 4 times per year  . Active Member of Clubs or Organizations: No  . Attends Banker Meetings: Never  . Marital Status: Married    Tobacco Counseling Counseling given: Not Answered   Clinical Intake:  Pre-visit preparation completed: Yes  Pain : No/denies pain     Nutritional Risks: None Diabetes: No  How often do you need to have someone help you when you read instructions, pamphlets, or other written materials from your doctor or pharmacy?: 1 - Never What is the last grade level you completed in school?: High School  Diabetic? No   Interpreter Needed?: No  Information entered by :: SCrews,LPN  Activities of Daily Living In your present state of health, do you have any difficulty performing the following activities: 02/04/2020  Hearing? N  Vision? N  Difficulty concentrating or making decisions? N  Walking or climbing stairs? N  Dressing or bathing? N  Doing errands, shopping? N  Preparing Food and eating ? N  Using the Toilet? N  In the past six months, have you accidently leaked urine? N  Do you have problems with loss of bowel control? N  Managing your Medications? N  Managing your Finances? N  Housekeeping or managing your Housekeeping? N  Some recent data might be hidden    Patient Care Team: Deeann Saint, MD as PCP - General (Family Medicine)  Indicate any recent Medical Services you may have received from other than [facility] providers in the past year (date may be approximate).     Assessment:   This is a routine wellness examination for Cathy Hensley.  Hearing/Vision screen  Hearing Screening   125Hz  250Hz  500Hz  1000Hz  2000Hz  3000Hz  4000Hz  6000Hz  8000Hz   Right ear:           Left ear:           Vision Screening Comments: Patient stated gets eyes examined once per year.  Wears reading glasses has a hx of cataract surgery    Dietary issues and exercise activities discussed: Current Exercise Habits: The patient does not participate in regular exercise at present, Exercise limited by: respiratory conditions(s)  Goals    . Exercise 3x per week (30 min per time)      Depression Screen PHQ 2/9 Scores 02/04/2020 04/10/2015 01/10/2015 12/16/2014 01/19/2013  PHQ - 2 Score 0 0 2 0 0  PHQ- 9 Score - - 4 - -    Fall Risk Fall Risk  02/04/2020 01/10/2015 12/16/2014 01/19/2013  Falls in the past year? 0 No No No  Number falls in past yr: 0 - - -  Injury with Fall? 0 - - -  Risk for fall due to : No Fall Risks - - -  Follow up Falls evaluation completed;Falls prevention discussed - - -    FALL RISK PREVENTION PERTAINING TO THE HOME:  Any stairs in or around the home? Yes  If so, are there any without handrails? No  Home free of loose throw rugs in walkways, pet beds, electrical cords, etc? Yes  Adequate lighting in your home to reduce risk of falls? Yes   ASSISTIVE DEVICES UTILIZED TO PREVENT FALLS:  Life alert? No  Use of a cane, walker or w/c? No  Grab bars in the bathroom? No  Shower chair or bench in shower? No  Elevated toilet seat or a handicapped toilet? No     Cognitive Function:   Normal cognitive status assessed by direct observation by this Nurse Health Advisor. No abnormalities found.        Immunizations Immunization History  Administered Date(s) Administered  . Fluad Quad(high Dose 65+) 09/21/2018, 09/21/2019  . Influenza Split 10/05/2010, 11/15/2012  . Influenza, High Dose Seasonal PF 09/21/2013, 10/01/2014, 09/06/2017  . Influenza,inj,Quad PF,6+ Mos 08/27/2016  . Influenza-Unspecified 08/24/2015  . PFIZER(Purple Top)SARS-COV-2 Vaccination 03/03/2019, 03/20/2019  . Pneumococcal Conjugate-13 01/19/2013  . Pneumococcal Polysaccharide-23 01/04/2005, 03/19/2010  . Td 09/05/2003  . Tdap 06/29/2010, 02/07/2019    TDAP status: Up to  date  Flu Vaccine status: Up to date  Pneumococcal vaccine status: Up to date  Covid-19 vaccine status: Completed vaccines  Qualifies for Shingles Vaccine? Yes   Zostavax  completed No   Shingrix Completed?: No.    Education has been provided regarding the importance of this vaccine. Patient has been advised to call insurance company to determine out of pocket expense if they have not yet received this vaccine. Advised may also receive vaccine at local pharmacy or Health Dept. Verbalized acceptance and understanding.  Screening Tests Health Maintenance  Topic Date Due  . Hepatitis C Screening  Never done  . COVID-19 Vaccine (3 - Booster for Pfizer series) 09/20/2019  . MAMMOGRAM  02/03/2021 (Originally 04/12/1958)  . DEXA SCAN  02/03/2021 (Originally 04/12/2006)  . TETANUS/TDAP  02/06/2029  . INFLUENZA VACCINE  Completed  . PNA vac Low Risk Adult  Completed    Health Maintenance  Health Maintenance Due  Topic Date Due  . Hepatitis C Screening  Never done  . COVID-19 Vaccine (3 - Booster for Pfizer series) 09/20/2019    Colorectal cancer screening: No longer required.   Mammogram Status:  Patient declined   Bone Density: Patient declined   Lung Cancer Screening: (Low Dose CT Chest recommended if Age 25-80 years, 30 pack-year currently smoking OR have quit w/in 15years.) does not qualify.   Lung Cancer Screening Referral: N/A   Additional Screening:  Hepatitis C Screening: does not qualify;   Vision Screening: Recommended annual ophthalmology exams for early detection of glaucoma and other disorders of the eye. Is the patient up to date with their annual eye exam?  Yes  Who is the provider or what is the name of the office in which the patient attends annual eye exams? [facility]  If pt is not established with a provider, would they like to be referred to a provider to establish care? No .   Dental Screening: Recommended annual dental exams for proper oral  hygiene  Community Resource Referral / Chronic Care Management: CRR required this visit?  No   CCM required this visit?  No      Plan:     I have personally reviewed and noted the following in the patient's chart:   . Medical and social history . Use of alcohol, tobacco or illicit drugs  . Current medications and supplements . Functional ability and status . Nutritional status . Physical activity . Advanced directives . List of other physicians . Hospitalizations, surgeries, and ER visits in previous 12 months . Vitals . Screenings to include cognitive, depression, and falls . Referrals and appointments  In addition, I have reviewed and discussed with patient certain preventive protocols, quality metrics, and best practice recommendations. A written personalized care plan for preventive services as well as general preventive health recommendations were provided to patient.     Theodora Blow, LPN   [id number]/[id number]   Nurse Notes: None

## 2020-02-04 NOTE — Patient Instructions (Signed)
Cathy Hensley , Thank you for taking time to come for your Medicare Wellness Visit. I appreciate your ongoing commitment to your health goals. Please review the following plan we discussed and let me know if I can assist you in the future.   Screening recommendations/referrals: Colonoscopy: No longer required  Mammogram: Patient declined  Bone Density: Patient declined Recommended yearly ophthalmology/optometry visit for glaucoma screening and checkup Recommended yearly dental visit for hygiene and checkup  Vaccinations: Influenza vaccine: Up to date, next due fall 2022  Pneumococcal vaccine: Completed series  Tdap vaccine: Up to date, next due 02/06/2029 Shingles vaccine: Currently due for Shingrix, if you wish to receive we recommend that you do so at your local pharmacy.    Advanced directives: Please bring in copies of your advanced medical directives  Conditions/risks identified: None   Next appointment: None    Preventive Care 65 Years and Older, Female Preventive care refers to lifestyle choices and visits with your health care provider that can promote health and wellness. What does preventive care include?  A yearly physical exam. This is also called an annual well check.  Dental exams once or twice a year.  Routine eye exams. Ask your health care provider how often you should have your eyes checked.  Personal lifestyle choices, including:  Daily care of your teeth and gums.  Regular physical activity.  Eating a healthy diet.  Avoiding tobacco and drug use.  Limiting alcohol use.  Practicing safe sex.  Taking low-dose aspirin every day.  Taking vitamin and mineral supplements as recommended by your health care provider. What happens during an annual well check? The services and screenings done by your health care provider during your annual well check will depend on your age, overall health, lifestyle risk factors, and family history of disease. Counseling   Your health care provider may ask you questions about your:  Alcohol use.  Tobacco use.  Drug use.  Emotional well-being.  Home and relationship well-being.  Sexual activity.  Eating habits.  History of falls.  Memory and ability to understand (cognition).  Work and work Astronomer.  Reproductive health. Screening  You may have the following tests or measurements:  Height, weight, and BMI.  Blood pressure.  Lipid and cholesterol levels. These may be checked every 5 years, or more frequently if you are over 52 years old.  Skin check.  Lung cancer screening. You may have this screening every year starting at age 53 if you have a 30-pack-year history of smoking and currently smoke or have quit within the past 15 years.  Fecal occult blood test (FOBT) of the stool. You may have this test every year starting at age 49.  Flexible sigmoidoscopy or colonoscopy. You may have a sigmoidoscopy every 5 years or a colonoscopy every 10 years starting at age 61.  Hepatitis C blood test.  Hepatitis B blood test.  Sexually transmitted disease (STD) testing.  Diabetes screening. This is done by checking your blood sugar (glucose) after you have not eaten for a while (fasting). You may have this done every 1-3 years.  Bone density scan. This is done to screen for osteoporosis. You may have this done starting at age 56.  Mammogram. This may be done every 1-2 years. Talk to your health care provider about how often you should have regular mammograms. Talk with your health care provider about your test results, treatment options, and if necessary, the need for more tests. Vaccines  Your health care provider may  recommend certain vaccines, such as:  Influenza vaccine. This is recommended every year.  Tetanus, diphtheria, and acellular pertussis (Tdap, Td) vaccine. You may need a Td booster every 10 years.  Zoster vaccine. You may need this after age 13.  Pneumococcal 13-valent  conjugate (PCV13) vaccine. One dose is recommended after age 47.  Pneumococcal polysaccharide (PPSV23) vaccine. One dose is recommended after age 22. Talk to your health care provider about which screenings and vaccines you need and how often you need them. This information is not intended to replace advice given to you by your health care provider. Make sure you discuss any questions you have with your health care provider. Document Released: 01/17/2015 Document Revised: 09/10/2015 Document Reviewed: 10/22/2014 Elsevier Interactive Patient Education  2017 [place] Prevention in the Home Falls can cause injuries. They can happen to people of all ages. There are many things you can do to make your home safe and to help prevent falls. What can I do on the outside of my home?  Regularly fix the edges of walkways and driveways and fix any cracks.  Remove anything that might make you trip as you walk through a door, such as a raised step or threshold.  Trim any bushes or trees on the path to your home.  Use bright outdoor lighting.  Clear any walking paths of anything that might make someone trip, such as rocks or tools.  Regularly check to see if handrails are loose or broken. Make sure that both sides of any steps have handrails.  Any raised decks and porches should have guardrails on the edges.  Have any leaves, snow, or ice cleared regularly.  Use sand or salt on walking paths during winter.  Clean up any spills in your garage right away. This includes oil or grease spills. What can I do in the bathroom?  Use night lights.  Install grab bars by the toilet and in the tub and shower. Do not use towel bars as grab bars.  Use non-skid mats or decals in the tub or shower.  If you need to sit down in the shower, use a plastic, non-slip stool.  Keep the floor dry. Clean up any water that spills on the floor as soon as it happens.  Remove soap buildup in the tub or  shower regularly.  Attach bath mats securely with double-sided non-slip rug tape.  Do not have throw rugs and other things on the floor that can make you trip. What can I do in the bedroom?  Use night lights.  Make sure that you have a light by your bed that is easy to reach.  Do not use any sheets or blankets that are too big for your bed. They should not hang down onto the floor.  Have a firm chair that has side arms. You can use this for support while you get dressed.  Do not have throw rugs and other things on the floor that can make you trip. What can I do in the kitchen?  Clean up any spills right away.  Avoid walking on wet floors.  Keep items that you use a lot in easy-to-reach places.  If you need to reach something above you, use a strong step stool that has a grab bar.  Keep electrical cords out of the way.  Do not use floor polish or wax that makes floors slippery. If you must use wax, use non-skid floor wax.  Do not have throw rugs and  other things on the floor that can make you trip. What can I do with my stairs?  Do not leave any items on the stairs.  Make sure that there are handrails on both sides of the stairs and use them. Fix handrails that are broken or loose. Make sure that handrails are as long as the stairways.  Check any carpeting to make sure that it is firmly attached to the stairs. Fix any carpet that is loose or worn.  Avoid having throw rugs at the top or bottom of the stairs. If you do have throw rugs, attach them to the floor with carpet tape.  Make sure that you have a light switch at the top of the stairs and the bottom of the stairs. If you do not have them, ask someone to add them for you. What else can I do to help prevent falls?  Wear shoes that:  Do not have high heels.  Have rubber bottoms.  Are comfortable and fit you well.  Are closed at the toe. Do not wear sandals.  If you use a stepladder:  Make sure that it is fully  opened. Do not climb a closed stepladder.  Make sure that both sides of the stepladder are locked into place.  Ask someone to hold it for you, if possible.  Clearly mark and make sure that you can see:  Any grab bars or handrails.  First and last steps.  Where the edge of each step is.  Use tools that help you move around (mobility aids) if they are needed. These include:  Canes.  Walkers.  Scooters.  Crutches.  Turn on the lights when you go into a dark area. Replace any light bulbs as soon as they burn out.  Set up your furniture so you have a clear path. Avoid moving your furniture around.  If any of your floors are uneven, fix them.  If there are any pets around you, be aware of where they are.  Review your medicines with your doctor. Some medicines can make you feel dizzy. This can increase your chance of falling. Ask your doctor what other things that you can do to help prevent falls. This information is not intended to replace advice given to you by your health care provider. Make sure you discuss any questions you have with your health care provider. Document Released: 10/17/2008 Document Revised: 05/29/2015 Document Reviewed: 01/25/2014 Elsevier Interactive Patient Education  2017 [employer].

## 2020-02-05 ENCOUNTER — Other Ambulatory Visit: Payer: Self-pay | Admitting: Internal Medicine

## 2020-02-05 MED ORDER — SPIRIVA RESPIMAT 2.5 MCG/ACT IN AERS: 2.0000 | INHALATION_SPRAY | Freq: Every day | RESPIRATORY_TRACT | 11 refills | Status: DC

## 2020-02-10 DIAGNOSIS — J449 Chronic obstructive pulmonary disease, unspecified: Secondary | ICD-10-CM | POA: Diagnosis not present

## 2020-02-12 DIAGNOSIS — J449 Chronic obstructive pulmonary disease, unspecified: Secondary | ICD-10-CM | POA: Diagnosis not present

## 2020-03-09 DIAGNOSIS — J449 Chronic obstructive pulmonary disease, unspecified: Secondary | ICD-10-CM | POA: Diagnosis not present

## 2020-03-10 ENCOUNTER — Other Ambulatory Visit: Payer: Self-pay | Admitting: Family Medicine

## 2020-03-10 DIAGNOSIS — J449 Chronic obstructive pulmonary disease, unspecified: Secondary | ICD-10-CM

## 2020-03-10 DIAGNOSIS — I10 Essential (primary) hypertension: Secondary | ICD-10-CM

## 2020-03-10 DIAGNOSIS — E782 Mixed hyperlipidemia: Secondary | ICD-10-CM

## 2020-03-11 DIAGNOSIS — J449 Chronic obstructive pulmonary disease, unspecified: Secondary | ICD-10-CM | POA: Diagnosis not present

## 2020-03-12 NOTE — Telephone Encounter (Signed)
Last OV Acute 05-22-19 Canceled OV 01-14-20 OV with Health Nurse for AWV 02-04-20 No Future OV Last Lipid Panel 09/2016 BMET 02/2019

## 2020-03-19 ENCOUNTER — Other Ambulatory Visit: Payer: Self-pay

## 2020-03-19 ENCOUNTER — Ambulatory Visit (INDEPENDENT_AMBULATORY_CARE_PROVIDER_SITE_OTHER): Payer: Medicare Other | Admitting: Family Medicine

## 2020-03-19 ENCOUNTER — Encounter: Payer: Self-pay | Admitting: Family Medicine

## 2020-03-19 VITALS — BP 135/80 | HR 92 | Temp 97.7°F | Ht 64.0 in | Wt 88.0 lb

## 2020-03-19 DIAGNOSIS — Z0000 Encounter for general adult medical examination without abnormal findings: Secondary | ICD-10-CM

## 2020-03-19 DIAGNOSIS — R634 Abnormal weight loss: Secondary | ICD-10-CM

## 2020-03-19 DIAGNOSIS — Z1159 Encounter for screening for other viral diseases: Secondary | ICD-10-CM

## 2020-03-19 DIAGNOSIS — I10 Essential (primary) hypertension: Secondary | ICD-10-CM | POA: Diagnosis not present

## 2020-03-19 DIAGNOSIS — J449 Chronic obstructive pulmonary disease, unspecified: Secondary | ICD-10-CM | POA: Diagnosis not present

## 2020-03-19 DIAGNOSIS — E782 Mixed hyperlipidemia: Secondary | ICD-10-CM

## 2020-03-19 LAB — CBC WITH DIFFERENTIAL/PLATELET
Basophils Absolute: 0 10*3/uL (ref 0.0–0.1)
Basophils Relative: 0.2 % (ref 0.0–3.0)
Eosinophils Absolute: 0 10*3/uL (ref 0.0–0.7)
Eosinophils Relative: 0.1 % (ref 0.0–5.0)
HCT: 36.8 % (ref 36.0–46.0)
Hemoglobin: 12.1 g/dL (ref 12.0–15.0)
Lymphocytes Relative: 12.2 % (ref 12.0–46.0)
Lymphs Abs: 0.6 10*3/uL — ABNORMAL LOW (ref 0.7–4.0)
MCHC: 33 g/dL (ref 30.0–36.0)
MCV: 96.5 fl (ref 78.0–100.0)
Monocytes Absolute: 0.3 10*3/uL (ref 0.1–1.0)
Monocytes Relative: 5.2 % (ref 3.0–12.0)
Neutro Abs: 4.1 10*3/uL (ref 1.4–7.7)
Neutrophils Relative %: 82.3 % — ABNORMAL HIGH (ref 43.0–77.0)
Platelets: 159 10*3/uL (ref 150.0–400.0)
RBC: 3.81 Mil/uL — ABNORMAL LOW (ref 3.87–5.11)
RDW: 12.3 % (ref 11.5–15.5)
WBC: 4.9 10*3/uL (ref 4.0–10.5)

## 2020-03-19 LAB — LIPID PANEL
Cholesterol: 181 mg/dL (ref 0–200)
HDL: 68.7 mg/dL (ref >39.00)
LDL Cholesterol: 93 mg/dL (ref 0–99)
NonHDL: 112.42
Total CHOL/HDL Ratio: 3
Triglycerides: 97 mg/dL (ref 0.0–149.0)
VLDL: 19.4 mg/dL (ref 0.0–40.0)

## 2020-03-19 LAB — COMPREHENSIVE METABOLIC PANEL
ALT: 15 U/L (ref 0–35)
AST: 22 U/L (ref 0–37)
Albumin: 4.2 g/dL (ref 3.5–5.2)
Alkaline Phosphatase: 54 U/L (ref 39–117)
BUN: 25 mg/dL — ABNORMAL HIGH (ref 6–23)
CO2: 34 mEq/L — ABNORMAL HIGH (ref 19–32)
Calcium: 9.9 mg/dL (ref 8.4–10.5)
Chloride: 97 mEq/L (ref 96–112)
Creatinine, Ser: 0.57 mg/dL (ref 0.40–1.20)
GFR: 86.73 mL/min (ref >60.00)
Glucose, Bld: 78 mg/dL (ref 70–99)
Potassium: 4.1 mEq/L (ref 3.5–5.1)
Sodium: 145 mEq/L (ref 135–145)
Total Bilirubin: 0.6 mg/dL (ref 0.2–1.2)
Total Protein: 6.4 g/dL (ref 6.0–8.3)

## 2020-03-19 LAB — HEMOGLOBIN A1C: Hgb A1c MFr Bld: 5.4 % (ref 4.6–6.5)

## 2020-03-19 LAB — VITAMIN B12: Vitamin B-12: 737 pg/mL (ref 211–911)

## 2020-03-19 LAB — VITAMIN D 25 HYDROXY (VIT D DEFICIENCY, FRACTURES): VITD: 36.82 ng/mL (ref 30.00–100.00)

## 2020-03-19 LAB — TSH: TSH: 0.55 u[IU]/mL (ref 0.35–4.50)

## 2020-03-19 LAB — T4, FREE: Free T4: 0.9 ng/dL (ref 0.60–1.60)

## 2020-03-19 MED ORDER — SIMVASTATIN 20 MG PO TABS: ORAL_TABLET | ORAL | 3 refills | Status: DC

## 2020-03-19 MED ORDER — MONTELUKAST SODIUM 10 MG PO TABS: 10.0000 mg | ORAL_TABLET | Freq: Every day | ORAL | 3 refills | Status: DC

## 2020-03-19 MED ORDER — AMLODIPINE BESYLATE 5 MG PO TABS: 5.0000 mg | ORAL_TABLET | Freq: Every day | ORAL | 3 refills | Status: DC

## 2020-03-19 NOTE — Patient Instructions (Signed)
Preventive Care 79 Years and Older, Female Preventive care refers to lifestyle choices and visits with your health care provider that can promote health and wellness. This includes:  A yearly physical exam. This is also called an annual wellness visit.  Regular dental and eye exams.  Immunizations.  Screening for certain conditions.  Healthy lifestyle choices, such as: ? Eating a healthy diet. ? Getting regular exercise. ? Not using drugs or products that contain nicotine and tobacco. ? Limiting alcohol use. What can I expect for my preventive care visit? Physical exam Your health care provider will check your:  Height and weight. These may be used to calculate your BMI (body mass index). BMI is a measurement that tells if you are at a healthy weight.  Heart rate and blood pressure.  Body temperature.  Skin for abnormal spots. Counseling Your health care provider may ask you questions about your:  Past medical problems.  Family's medical history.  Alcohol, tobacco, and drug use.  Emotional well-being.  Home life and relationship well-being.  Sexual activity.  Diet, exercise, and sleep habits.  History of falls.  Memory and ability to understand (cognition).  Work and work Statistician.  Pregnancy and menstrual history.  Access to firearms. What immunizations do I need? Vaccines are usually given at various ages, according to a schedule. Your health care provider will recommend vaccines for you based on your age, medical history, and lifestyle or other factors, such as travel or where you work.   What tests do I need? Blood tests  Lipid and cholesterol levels. These may be checked every 5 years, or more often depending on your overall health.  Hepatitis C test.  Hepatitis B test. Screening  Lung cancer screening. You may have this screening every year starting at age 79 if you have a 30-pack-year history of smoking and currently smoke or have quit within  the past 15 years.  Colorectal cancer screening. ? All adults should have this screening starting at age 79 and continuing until age 79. ? Your health care provider may recommend screening at age 79 if you are at increased risk. ? You will have tests every 1-10 years, depending on your results and the type of screening test.  Diabetes screening. ? This is done by checking your blood sugar (glucose) after you have not eaten for a while (fasting). ? You may have this done every 1-3 years.  Mammogram. ? This may be done every 1-2 years. ? Talk with your health care provider about how often you should have regular mammograms.  Abdominal aortic aneurysm (AAA) screening. You may need this if you are a current or former smoker.  BRCA-related cancer screening. This may be done if you have a family history of breast, ovarian, tubal, or peritoneal cancers. Other tests  STD (sexually transmitted disease) testing, if you are at risk.  Bone density scan. This is done to screen for osteoporosis. You may have this done starting at age 79. Talk with your health care provider about your test results, treatment options, and if necessary, the need for more tests. Follow these instructions at home: Eating and drinking  Eat a diet that includes fresh fruits and vegetables, whole grains, lean protein, and low-fat dairy products. Limit your intake of foods with high amounts of sugar, saturated fats, and salt.  Take vitamin and mineral supplements as recommended by your health care provider.  Do not drink alcohol if your health care provider tells you not to drink.  If you drink alcohol: ? Limit how much you have to 0-1 drink a day. ? Be aware of how much alcohol is in your drink. In the [place] one drink equals one 12 oz bottle of beer (355 mL), one 5 oz glass of wine (148 mL), or one 1 oz glass of hard liquor (44 mL).   Lifestyle  Take daily care of your teeth and gums. Brush your teeth every morning  and night with fluoride toothpaste. Floss one time each day.  Stay active. Exercise for at least 30 minutes 5 or more days each week.  Do not use any products that contain nicotine or tobacco, such as cigarettes, e-cigarettes, and chewing tobacco. If you need help quitting, ask your health care provider.  Do not use drugs.  If you are sexually active, practice safe sex. Use a condom or other form of protection in order to prevent STIs (sexually transmitted infections).  Talk with your health care provider about taking a low-dose aspirin or statin.  Find healthy ways to cope with stress, such as: ? Meditation, yoga, or listening to music. ? Journaling. ? Talking to a trusted person. ? Spending time with friends and family. Safety  Always wear your seat belt while driving or riding in a vehicle.  Do not drive: ? If you have been drinking alcohol. Do not ride with someone who has been drinking. ? When you are tired or distracted. ? While texting.  Wear a helmet and other protective equipment during sports activities.  If you have firearms in your house, make sure you follow all gun safety procedures. What's next?  Visit your health care provider once a year for an annual wellness visit.  Ask your health care provider how often you should have your eyes and teeth checked.  Stay up to date on all vaccines. This information is not intended to replace advice given to you by your health care provider. Make sure you discuss any questions you have with your health care provider. Document Revised: 12/12/2019 Document Reviewed: 12/15/2017 Elsevier Patient Education  2021 [employer] Your Hypertension Hypertension, also called high blood pressure, is when the force of the blood pressing against the walls of the arteries is too strong. Arteries are blood vessels that carry blood from your heart throughout your body. Hypertension forces the heart to work harder to pump blood  and may cause the arteries to become narrow or stiff. Understanding blood pressure readings Your personal target blood pressure may vary depending on your medical conditions, your age, and other factors. A blood pressure reading includes a higher number over a lower number. Ideally, your blood pressure should be below 120/80. You should know that:  The first, or top, number is called the systolic pressure. It is a measure of the pressure in your arteries as your heart beats.  The second, or bottom number, is called the diastolic pressure. It is a measure of the pressure in your arteries as the heart relaxes. Blood pressure is classified into four stages. Based on your blood pressure reading, your health care provider may use the following stages to determine what type of treatment you need, if any. Systolic pressure and diastolic pressure are measured in a unit called mmHg. Normal  Systolic pressure: below 983.  Diastolic pressure: below 80. Elevated  Systolic pressure: 382-505.  Diastolic pressure: below 80. Hypertension stage 1  Systolic pressure: 397-673.  Diastolic pressure: 41-93. Hypertension stage 2  Systolic pressure: 790 or above.  Diastolic pressure: 90 or above. How can this condition affect me? Managing your hypertension is an important responsibility. Over time, hypertension can damage the arteries and decrease blood flow to important parts of the body, including the brain, heart, and kidneys. Having untreated or uncontrolled hypertension can lead to:  A heart attack.  A stroke.  A weakened blood vessel (aneurysm).  Heart failure.  Kidney damage.  Eye damage.  Metabolic syndrome.  Memory and concentration problems.  Vascular dementia. What actions can I take to manage this condition? Hypertension can be managed by making lifestyle changes and possibly by taking medicines. Your health care provider will help you make a plan to bring your blood pressure  within a normal range. Nutrition  Eat a diet that is high in fiber and potassium, and low in salt (sodium), added sugar, and fat. An example eating plan is called the Dietary Approaches to Stop Hypertension (DASH) diet. To eat this way: ? Eat plenty of fresh fruits and vegetables. Try to fill one-half of your plate at each meal with fruits and vegetables. ? Eat whole grains, such as whole-wheat pasta, brown rice, or whole-grain bread. Fill about one-fourth of your plate with whole grains. ? Eat low-fat dairy products. ? Avoid fatty cuts of meat, processed or cured meats, and poultry with skin. Fill about one-fourth of your plate with lean proteins such as fish, chicken without skin, beans, eggs, and tofu. ? Avoid pre-made and processed foods. These tend to be higher in sodium, added sugar, and fat.  Reduce your daily sodium intake. Most people with hypertension should eat less than 1,500 mg of sodium a day.   Lifestyle  Work with your health care provider to maintain a healthy body weight or to lose weight. Ask what an ideal weight is for you.  Get at least 30 minutes of exercise that causes your heart to beat faster (aerobic exercise) most days of the week. Activities may include walking, swimming, or biking.  Include exercise to strengthen your muscles (resistance exercise), such as weight lifting, as part of your weekly exercise routine. Try to do these types of exercises for 30 minutes at least 3 days a week.  Do not use any products that contain nicotine or tobacco, such as cigarettes, e-cigarettes, and chewing tobacco. If you need help quitting, ask your health care provider.  Control any long-term (chronic) conditions you have, such as high cholesterol or diabetes.  Identify your sources of stress and find ways to manage stress. This may include meditation, deep breathing, or making time for fun activities.   Alcohol use  Do not drink alcohol if: ? Your health care provider tells  you not to drink. ? You are pregnant, may be pregnant, or are planning to become pregnant.  If you drink alcohol: ? Limit how much you use to:  0-1 drink a day for women.  0-2 drinks a day for men. ? Be aware of how much alcohol is in your drink. In the [place] one drink equals one 12 oz bottle of beer (355 mL), one 5 oz glass of wine (148 mL), or one 1 oz glass of hard liquor (44 mL). Medicines Your health care provider may prescribe medicine if lifestyle changes are not enough to get your blood pressure under control and if:  Your systolic blood pressure is 130 or higher.  Your diastolic blood pressure is 80 or higher. Take medicines only as told by your health care provider. Follow the directions carefully. Blood  pressure medicines must be taken as told by your health care provider. The medicine does not work as well when you skip doses. Skipping doses also puts you at risk for problems. Monitoring Before you monitor your blood pressure:  Do not smoke, drink caffeinated beverages, or exercise within 30 minutes before taking a measurement.  Use the bathroom and empty your bladder (urinate).  Sit quietly for at least 5 minutes before taking measurements. Monitor your blood pressure at home as told by your health care provider. To do this:  Sit with your back straight and supported.  Place your feet flat on the floor. Do not cross your legs.  Support your arm on a flat surface, such as a table. Make sure your upper arm is at heart level.  Each time you measure, take two or three readings one minute apart and record the results. You may also need to have your blood pressure checked regularly by your health care provider.   General information  Talk with your health care provider about your diet, exercise habits, and other lifestyle factors that may be contributing to hypertension.  Review all the medicines you take with your health care provider because there may be side effects  or interactions.  Keep all visits as told by your health care provider. Your health care provider can help you create and adjust your plan for managing your high blood pressure. Where to find more information  National Heart, Lung, and Blood Institute: https://wilson-eaton.com/  American Heart Association: www.heart.org Contact a health care provider if:  You think you are having a reaction to medicines you have taken.  You have repeated (recurrent) headaches.  You feel dizzy.  You have swelling in your ankles.  You have trouble with your vision. Get help right away if:  You develop a severe headache or confusion.  You have unusual weakness or numbness, or you feel faint.  You have severe pain in your chest or abdomen.  You vomit repeatedly.  You have trouble breathing. These symptoms may represent a serious problem that is an emergency. Do not wait to see if the symptoms will go away. Get medical help right away. Call your local emergency services (911 in the [place] Do not drive yourself to the hospital. Summary  Hypertension is when the force of blood pumping through your arteries is too strong. If this condition is not controlled, it may put you at risk for serious complications.  Your personal target blood pressure may vary depending on your medical conditions, your age, and other factors. For most people, a normal blood pressure is less than 120/80.  Hypertension is managed by lifestyle changes, medicines, or both.  Lifestyle changes to help manage hypertension include losing weight, eating a healthy, low-sodium diet, exercising more, stopping smoking, and limiting alcohol. This information is not intended to replace advice given to you by your health care provider. Make sure you discuss any questions you have with your health care provider. Document Revised: 01/26/2019 Document Reviewed: 11/21/2018 Elsevier Patient Education  2021 [employer].  High-Protein and  High-Calorie Diet Eating high-protein and high-calorie foods can help you to gain weight, heal after an injury, and recover after an illness or surgery. The specific amount of daily protein and calories you need depends on:  Your body weight.  The reason this diet is recommended for you. What is my plan? Generally, a high-protein, high-calorie diet involves:  Eating 250-500 extra calories each day.  Making sure that you get enough  of your daily calories from protein. Ask your health care provider how many of your calories should come from protein. Talk with a health care provider, such as a diet and nutrition specialist (dietitian), about how much protein and how many calories you need each day. Follow the diet as directed by your health care provider. What are tips for following this plan? Preparing meals  Add whole milk, half-and-half, or heavy cream to cereal, pudding, soup, or hot cocoa.  Add whole milk to instant breakfast drinks.  Add peanut butter to oatmeal or smoothies.  Add powdered milk to baked goods, smoothies, or milkshakes.  Add powdered milk, cream, or butter to mashed potatoes.  Add cheese to cooked vegetables.  Make whole-milk yogurt parfaits. Top them with granola, fruit, or nuts.  Add cottage cheese to your fruit.  Add avocado, cheese, or both to sandwiches or salads.  Add meat, poultry, or seafood to rice, pasta, casseroles, salads, and soups.  Use mayonnaise when making egg salad, chicken salad, or tuna salad.  Use peanut butter as a dip for vegetables or as a topping for pretzels, celery, or crackers.  Add beans to casseroles, dips, and spreads.  Add pureed beans to sauces and soups.  Replace calorie-free drinks with calorie-containing drinks, such as milk and fruit juice.  Replace water with milk or heavy cream when making foods such as oatmeal, pudding, or cocoa. General instructions  Ask your health care provider if you should take a  nutritional supplement.  Try to eat six small meals each day instead of three large meals.  Eat a balanced diet. In each meal, include one food that is high in protein.  Keep nutritious snacks available, such as nuts, trail mixes, dried fruit, and yogurt.  If you have kidney disease or diabetes, talk with your health care provider about how much protein is safe for you. Too much protein may put extra stress on your kidneys.  Drink your calories. Choose high-calorie drinks and have them after your meals.   What high-protein foods should I eat? Vegetables Soybeans. Peas. Grains Quinoa. Bulgur wheat. Meats and other proteins Beef, pork, and poultry. Fish and seafood. Eggs. Tofu. Textured vegetable protein (TVP). Peanut butter. Nuts and seeds. Dried beans. Protein powders. Dairy Whole milk. Whole-milk yogurt. Powdered milk. Cheese. [employer]. Eggnog. Beverages High-protein supplement drinks. Soy milk. Other foods Protein bars. The items listed above may not be a complete list of high-protein foods and beverages. Contact a dietitian for more options.   What high-calorie foods should I eat? Fruits Dried fruit. Fruit leather. Canned fruit in syrup. Fruit juice. Avocado. Vegetables Vegetables cooked in oil or butter. Fried potatoes. Grains Pasta. Quick breads. Muffins. Pancakes. Ready-to-eat cereal. Meats and other proteins Peanut butter. Nuts and seeds. Dairy Heavy cream. Whipped cream. Cream cheese. Sour cream. Ice cream. Custard. Pudding. Beverages Meal-replacement beverages. Nutrition shakes. Fruit juice. Sugar-sweetened soft drinks. Seasonings and condiments Salad dressing. Mayonnaise. Alfredo sauce. Fruit preserves or jelly. Honey. Syrup. Sweets and desserts Cake. Cookies. Pie. Pastries. Candy bars. Chocolate. Fats and oils Butter or margarine. Oil. Gravy. Other foods Meal-replacement bars. The items listed above may not be a complete list of high-calorie foods and  beverages. Contact a dietitian for more options. Summary  A high-protein, high-calorie diet can help you gain weight or heal faster after an injury, illness, or surgery.  To increase your protein and calories, add ingredients such as whole milk, peanut butter, cheese, beans, meat, or seafood to meal items.  To  get enough extra calories each day, include high-calorie foods and beverages at each meal.  Adding a high-calorie drink or shake can be an easy way to help you get enough calories each day. Talk with your healthcare provider or dietitian about the best options for you. This information is not intended to replace advice given to you by your health care provider. Make sure you discuss any questions you have with your health care provider. Document Revised: 12/03/2016 Document Reviewed: 11/02/2016 Elsevier Patient Education  2021 [employer].

## 2020-03-19 NOTE — Progress Notes (Addendum)
Subjective:     Cathy Hensley is a 80 y.o. female with pmh sig for COPD, HTN, HLD, protein calorie malnutrition who is here for a comprehensive physical exam.   Pt states she is doing ok.  Dealing with some increased stress at home.  Losing weight. Eating, but notes decreased appetite. Pt with dull occipital HA q am. Takes tylenol.  Using 2L O2 via [place] , tiotropium, brovana nebs, pulmicort nebs, and albuterol daily for COPD.  Pt had to get up early this am to ensure she had plenty of time to take meds and prepare for getting to this visit.    Pt has not had colonoscopy, mammogram, or low dose CT for cancer screenings.    Social History   Socioeconomic History  . Marital status: Married    Spouse name: Not on file  . Number of children: 2  . Years of education: Not on file  . Highest education level: Not on file  Occupational History  . Occupation: retired  Tobacco Use  . Smoking status: Former Smoker    Packs/day: 0.50    Years: 42.00    Pack years: 21.00    Types: Cigarettes    Quit date: 09/23/2000    Years since quitting: 19.4  . Smokeless tobacco: Never Used  Substance and Sexual Activity  . Alcohol use: Yes    Alcohol/week: 1.0 standard drink    Types: 1 Standard drinks or equivalent per week    Comment: 1 glass of wine nearly daily.  . Drug use: No  . Sexual activity: Not on file  Other Topics Concern  . Not on file  Social History Narrative   Lives at home with her husband. Retired.      [facility]:   She is originally from [place]. She has 2 daughters, one lives in [place] & the other in [place]. Previously worked Clinical cytogeneticist a Musician, ran a Lawyer, clerical work, Lawyer. She has no pets currently. No previous bird, mold, or hot tub exposure.    Social Determinants of Health   Financial Resource Strain: Low Risk   . Difficulty of Paying Living Expenses: Not hard at all  Food Insecurity: No Food Insecurity  . Worried About  Programme researcher, broadcasting/film/video in the Last Year: Never true  . Ran Out of Food in the Last Year: Never true  Transportation Needs: No Transportation Needs  . Lack of Transportation (Medical): No  . Lack of Transportation (Non-Medical): No  Physical Activity: Inactive  . Days of Exercise per Week: 0 days  . Minutes of Exercise per Session: 0 min  Stress: No Stress Concern Present  . Feeling of Stress : Not at all  Social Connections: Moderately Integrated  . Frequency of Communication with Friends and Family: More than three times a week  . Frequency of Social Gatherings with Friends and Family: More than three times a week  . Attends Religious Services: More than 4 times per year  . Active Member of Clubs or Organizations: No  . Attends Banker Meetings: Never  . Marital Status: Married  Catering manager Violence: Not At Risk  . Fear of Current or Ex-Partner: No  . Emotionally Abused: No  . Physically Abused: No  . Sexually Abused: No   Health Maintenance  Topic Date Due  . Hepatitis C Screening  Never done  . COVID-19 Vaccine (3 - Booster for Pfizer series) 09/20/2019  . MAMMOGRAM  02/03/2021 (Originally 04/12/1958)  .  DEXA SCAN  02/03/2021 (Originally 04/12/2006)  . TETANUS/TDAP  02/06/2029  . INFLUENZA VACCINE  Completed  . PNA vac Low Risk Adult  Completed  . HPV VACCINES  Aged Out    The following portions of the patient's history were reviewed and updated as appropriate: allergies, current medications, past family history, past medical history, past social history, past surgical history and problem list.  Review of Systems Pertinent items noted in HPI and remainder of comprehensive ROS otherwise negative.   Objective:    BP 135/80 (BP Location: Right Arm, Patient Position: Sitting, Cuff Size: Normal)   Pulse 92   Temp 97.7 F (36.5 C) (Oral)   Ht 5\' 4"  (1.626 m)   Wt 88 lb (39.9 kg)   SpO2 96% Comment: 2 liters  BMI 15.11 kg/m  General appearance: alert,  cooperative and on O2 via [place], thin, in NAD. Head: Normocephalic, without obvious abnormality, atraumatic Eyes: conjunctivae/corneas clear. PERRL, EOM's intact. Fundi benign. Ears: normal TM's and external ear canals both ears Nose: Nares normal. Septum midline. Mucosa normal. No drainage or sinus tenderness. Throat: lips, mucosa, and tongue normal; teeth and gums normal Neck: no adenopathy, no carotid bruit, no JVD, supple, symmetrical, trachea midline and thyroid not enlarged, symmetric, no tenderness/mass/nodules Lungs: clear to auscultation bilaterally Heart: regular rate and rhythm, S1, S2 normal, no murmur, click, rub or gallop Abdomen: soft, non-tender; bowel sounds normal; no masses,  no organomegaly Extremities: Clubbing of bilateral, moves all 4 extremities without difficulty.  Normal tone.  Thin. Pulses: 2+ and symmetric Skin: Skin color, texture, turgor normal. No rashes.  1.5 cm epidermoid cyst of left lateral neck near hairline with central punctum with dark appearing debris present within, nontender, mobile, without erythema. Lymph nodes: Cervical, supraclavicular, and axillary nodes normal. Neurologic: Alert and oriented X 3, normal strength and tone. Normal symmetric reflexes. Normal coordination and gait    Assessment:    Healthy female exam.     Plan:     Anticipatory guidance given including wearing seatbelts, smoke detectors in the home, increasing physical activity, increasing p.o. intake of water and vegetables. -We will obtain labs -discussed cancer screenings. Pt declines. -Consider referral to dermatology for removal of cyst -given handout -next CPE in 1 yr See After Visit Summary for Counseling Recommendations    Encounter for hepatitis C screening test for low risk patient  - Plan: Hep C Antibody  COPD GOLD IV   -continue 2L via [place] continuously. -continue current meds albuterol inhaler, tiotropium inahler, brovana nebs, and pulmicort nebs. -continue f/u  with Pulm - Plan: montelukast (SINGULAIR) 10 MG tablet  Weight loss  -6 lbs weigh loss since 09/21/19 -will obtain labs -consider boost or ensure  -further recommendations such as imaging based on results. - Plan: Vitamin D, 25-hydroxy, Vitamin B12, TSH, T4, Free, Hemoglobin A1c, CBC with Differential/Platelet  Essential hypertension  -elevated.   -continue norvasc 5 mg -discussed self care - Plan: CMP, amLODipine (NORVASC) 5 MG tablet  Mixed hyperlipidemia  - Plan: Lipid panel, simvastatin (ZOCOR) 20 MG tablet  F/u prn  09/23/19, MD

## 2020-03-20 ENCOUNTER — Ambulatory Visit: Payer: Medicare Other | Admitting: Adult Health

## 2020-03-20 ENCOUNTER — Telehealth: Payer: Self-pay | Admitting: Family Medicine

## 2020-03-20 NOTE — Progress Notes (Signed)
Spoke with patient, is aware of results. 

## 2020-03-20 NOTE — Telephone Encounter (Signed)
Spoke with patient, is aware of results. 

## 2020-03-20 NOTE — Telephone Encounter (Signed)
Pt is calling in to get her lab results and would like to have a call back.  Pt is aware that once the provider has resulted the labs someone will give her a call with the plan of care.

## 2020-03-24 ENCOUNTER — Encounter: Payer: Self-pay | Admitting: Family Medicine

## 2020-03-25 ENCOUNTER — Other Ambulatory Visit: Payer: Self-pay

## 2020-03-25 ENCOUNTER — Ambulatory Visit: Payer: Medicare Other | Admitting: Adult Health

## 2020-03-25 ENCOUNTER — Encounter: Payer: Self-pay | Admitting: Adult Health

## 2020-03-25 DIAGNOSIS — J449 Chronic obstructive pulmonary disease, unspecified: Secondary | ICD-10-CM

## 2020-03-25 DIAGNOSIS — J9611 Chronic respiratory failure with hypoxia: Secondary | ICD-10-CM

## 2020-03-25 DIAGNOSIS — E43 Unspecified severe protein-calorie malnutrition: Secondary | ICD-10-CM

## 2020-03-25 DIAGNOSIS — J9612 Chronic respiratory failure with hypercapnia: Secondary | ICD-10-CM

## 2020-03-25 NOTE — Assessment & Plan Note (Signed)
Patient is encouraged on high-protein diet.

## 2020-03-25 NOTE — Telephone Encounter (Signed)
Pt is calling in wanting to have a call back before she is referred out to any doctors about her having a UTI she stated it is very important that she have a call back before she moves forward with any appointments.

## 2020-03-25 NOTE — Assessment & Plan Note (Signed)
Hypoxic and hypercarbic respiratory failure.  Patient with no increased oxygen demands.  Bicarb level appears to be stable at this time.  Discussed that going forward that BiPAP or noninvasive support might be an option to decrease hypercarbia.  Patient has not had any frequent COPD exacerbations and appears to be compensated at currently.  We will continue to monitor  Plan  Patient Instructions  Continue on Spiriva daily  Continue on Brovana and Pulmicort Neb Twice daily   Continue on Singulair daily .  ProAir 2 puffs every 4hrs as needed wheezing .  Activity as tolerated High-protein diet, may try ensure between meals.  Continue on 2.5L/m oxygen . (Pulsing device 4l/m )  Follow up with [name]  Or Meyah Corle NP in 6 months and As needed

## 2020-03-25 NOTE — Assessment & Plan Note (Signed)
Compensated on present regimen.  No changes  Plan  Patient Instructions  Continue on Spiriva daily  Continue on Brovana and Pulmicort Neb Twice daily   Continue on Singulair daily .  ProAir 2 puffs every 4hrs as needed wheezing .  Activity as tolerated High-protein diet, may try ensure between meals.  Continue on 2.5L/m oxygen . (Pulsing device 4l/m )  Follow up with [name]  Or Aisha Greenberger NP in 6 months and As needed

## 2020-03-25 NOTE — Patient Instructions (Signed)
Continue on Spiriva daily  Continue on Brovana and Pulmicort Neb Twice daily   Continue on Singulair daily .  ProAir 2 puffs every 4hrs as needed wheezing .  Activity as tolerated High-protein diet, may try ensure between meals.  Continue on 2.5L/m oxygen . (Pulsing device 4l/m )  Follow up with [name]  Or Natsuko Kelsay NP in 6 months and As needed

## 2020-03-25 NOTE — Telephone Encounter (Signed)
Called patient back x2 to get clarification about UTI, no answer

## 2020-03-25 NOTE — Progress Notes (Signed)
@Patient  ID: , female    DOB: 09-27-1940, 80 y.o.   MRN: [id number]    Referring provider: [id number], MD  HPI: 80 year old female former smoker followed for severe COPD with chronic hypoxic and hypercarbic respiratory failure  Alpha-1 antitrypsin: MZ (88)  TEST/EVENTS :  01/22/15: FVC 1.58 L (53%) FEV1 0.58 L (26%) FEV1/FVC 0.37 FEF 25-75 0.23 L (13%) no bronchodilator response 11/05/14:FVC 1.52 L (51%) FEV1 0.49 L (22%) FEV1/FVC 0.32 FEF 25-75 0.20 L (11%) no bronchodilator response ERV 87% DLCO uncorrected 27%  13/01/16 01/22/15: Walked 192 meters / Baseline Sat 93% on RA / Nadir Sat 70% on RA (maintained saturation with 3 L/m pulse at 90%)  IMAGING Portable CXR 09/03/14 (previouslyreviewed by me):Hyperinflation with flattening of the diaphragms. No focal opacity or mass appreciated. No pleural effusion or thickening appreciated. Heart normal in size. Mediastinum normal in contour.  CARDIAC TTE (09/04/14): LV normal in size. Moderate LVH. EF 60-65%. No regional wall motion abnormalities. LA & RA normal in size. RV normal in size and function. Pulmonary artery systolic pressure 30 mmHg. No aortic stenosis or regurgitation. Normal aortic root. Mild mitral regurgitation. No pulmonic regurgitation. Trivial tricuspid regurgitation. Trivial pericardial effusion.  MICROBIOLOGY Sputum (09/06/14): Oral flora Urine Strep Ag (09/06/14): Negative Urine Legionella Ag (09/06/14): Negative Influenza A/B PCR (09/06/14): Negative   LABS 11/06/14 Alpha-1 antitrypsin: MZ (88)  09/06/14 ABG on 3 L/m: 7.40 / 65 / 63 / 91% CBC: 11.5/14.2/46.3/240 BMP: 140/4.4/99/41/19/0.38/118/9.2 LFT: 3.7/6.3/0.5/61/20/21  03/25/2020 Follow up : COPD and O2 RF  Patient presents for a 79-month follow-up.  Patient has underlying severe COPD that is oxygen dependent.  She remains on Pulmicort and Brovana nebulizers twice daily.  She is on Spiriva daily.  She remains on oxygen 2.5 L continuously.   Patient says overall breathing is doing the same.  She gets short of breath with minimum activity.  She denies any increased oxygen demands. She leads a very sedentary lifestyle.Covid vaccines are up-to-date. Weight remains low , discussed high protein diet .  Patient says that she had recent lab work done with her primary care provider and was informed that her bicarb level is elevated.  It was at 34.  Patient has known chronic hypercarbia.  Bicarb level has been elevated in the past.  Ranging from 32-35 over the last 4 years.. Patient denies any increased confusion, daytime sleepiness, increased work of breathing or increased oxygen demands.. She has had no recent flares in her COPD.  No hospitalizations or emergency room visits.     No Known Allergies  Immunization History  Administered Date(s) Administered  . Fluad Quad(high Dose 65+) 09/21/2018, 09/21/2019  . Influenza Split 10/05/2010, 11/15/2012  . Influenza, High Dose Seasonal PF 09/21/2013, 10/01/2014, 09/06/2017  . Influenza,inj,Quad PF,6+ Mos 08/27/2016  . Influenza-Unspecified 08/24/2015  . PFIZER(Purple Top)SARS-COV-2 Vaccination 03/03/2019, 03/20/2019, 10/04/2019  . Pneumococcal Conjugate-13 01/19/2013  . Pneumococcal Polysaccharide-23 01/04/2005, 03/19/2010  . Td 09/05/2003  . Tdap 06/29/2010, 02/07/2019    Past Medical History:  Diagnosis Date  . Alpha-1-antitrypsin deficiency carrier   . COPD 07/13/2006  . HYPERTENSION 07/13/2006  . MENOPAUSAL SYNDROME 07/13/2006  . Other and unspecified hyperlipidemia 07/13/2006  . Respiratory failure (HCC)    Chronic hypoxic    Tobacco History: Social History   Tobacco Use  Smoking Status Former Smoker  . Packs/day: 0.50  . Years: 42.00  . Pack years: 21.00  . Types: Cigarettes  . Quit date: 09/23/2000  . Years since quitting: 34.5  Smokeless Tobacco Never Used   Counseling given: Not Answered   Outpatient Medications Prior to Visit  Medication Sig Dispense Refill  .  acetaminophen (TYLENOL) 500 MG tablet Take 500 mg by mouth every 6 (six) hours as needed.    Marland Kitchen albuterol (VENTOLIN HFA) 108 (90 Base) MCG/ACT inhaler INHALE 2 PUFFS INTO THE LUNGS EVERY 6 HOURS AS NEEDED FOR WHEEZING OR SHORTNESS OF BREATH 8.5 g 3  . amLODipine (NORVASC) 5 MG tablet Take 1 tablet (5 mg total) by mouth daily. 90 tablet 3  . arformoterol (BROVANA) 15 MCG/2ML NEBU USE 1 VIAL  IN  NEBULIZER TWICE  DAILY - morning and evening 60 mL 11  . aspirin 81 MG chewable tablet Chew 1 tablet (81 mg total) by mouth daily.    . budesonide (PULMICORT) 0.25 MG/2ML nebulizer solution USE 1 VIAL  IN  NEBULIZER TWICE  DAILY - rinse mouth after treatment 60 mL 11  . dextromethorphan-guaiFENesin (MUCINEX DM) 30-600 MG 12hr tablet Take 1 tablet by mouth 2 (two) times daily.    . montelukast (SINGULAIR) 10 MG tablet Take 1 tablet (10 mg total) by mouth at bedtime. 90 tablet 3  . Multiple Vitamins-Minerals (CENTRUM SILVER PO) Take 1 tablet by mouth daily.    . OXYGEN 2.5 lpm 24/7 (she uses 4lpm pulsed when on POC)    . Respiratory Therapy Supplies (FLUTTER) DEVI 1 Device by Does not apply route as needed. 1 each 0  . simvastatin (ZOCOR) 20 MG tablet TAKE 1 TABLET(20 MG) BY MOUTH AT BEDTIME 90 tablet 3  . Tiotropium Bromide Monohydrate (SPIRIVA RESPIMAT) 2.5 MCG/ACT AERS Inhale 2 puffs into the lungs daily. 4 g 11   No facility-administered medications prior to visit.     Review of Systems:   Constitutional:   No  weight loss, night sweats,  Fevers, chills,  +fatigue, or  lassitude.  HEENT:   No headaches,  Difficulty swallowing,  Tooth/dental problems, or  Sore throat,                No sneezing, itching, ear ache, nasal congestion, post nasal drip,   CV:  No chest pain,  Orthopnea, PND, swelling in lower extremities, anasarca, dizziness, palpitations, syncope.   GI  No heartburn, indigestion, abdominal pain, nausea, vomiting, diarrhea, change in bowel habits, loss of appetite, bloody stools.    Resp:   No chest wall deformity  Skin: no rash or lesions.  GU: no dysuria, change in color of urine, no urgency or frequency.  No flank pain, no hematuria   MS:  No joint pain or swelling.  No decreased range of motion.  No back pain.    Physical Exam  BP (!) 148/68 (BP Location: Left Arm, Patient Position: Sitting, Cuff Size: Normal)   Pulse 98   Temp 98.3 F (36.8 C) (Skin)   Ht 5\' 5"  (1.651 m)   Wt 90 lb 6.4 oz (41 kg)   SpO2 97% Comment: 2.5 L [place]  BMI 15.04 kg/m   GEN: A/Ox3; pleasant , NAD, thin and cachectic   HEENT:  /AT,    NOSE-clear, THROAT-clear, no lesions, no postnasal drip or exudate noted.   NECK:  Supple w/ fair ROM; no JVD; normal carotid impulses w/o bruits; no thyromegaly or nodules palpated; no lymphadenopathy.    RESP  Clear  P & A; w/o, wheezes/ rales/ or rhonchi. no accessory muscle use, no dullness to percussion  CARD:  RRR, no m/r/g, no peripheral edema, pulses intact, no cyanosis  or clubbing.  GI:   Soft & nt; nml bowel sounds; no organomegaly or masses detected.   Musco: Warm bil, no deformities or joint swelling noted.   Neuro: alert, no focal deficits noted.    Skin: Warm, no lesions or rashes    Lab Results:  CBC    Component Value Date/Time   WBC 4.9 03/19/2020 1208   RBC 3.81 (L) 03/19/2020 1208   HGB 12.1 03/19/2020 1208   HCT 36.8 03/19/2020 1208   PLT 159.0 03/19/2020 1208   MCV 96.5 03/19/2020 1208   MCH 31.8 02/07/2019 1434   MCHC 33.0 03/19/2020 1208   RDW 12.3 03/19/2020 1208   LYMPHSABS 0.6 (L) 03/19/2020 1208   MONOABS 0.3 03/19/2020 1208   EOSABS 0.0 03/19/2020 1208   BASOSABS 0.0 03/19/2020 1208    BMET    Component Value Date/Time   NA 145 03/19/2020 1208   K 4.1 03/19/2020 1208   CL 97 03/19/2020 1208   CO2 34 (H) 03/19/2020 1208   GLUCOSE 78 03/19/2020 1208   BUN 25 (H) 03/19/2020 1208   CREATININE 0.57 03/19/2020 1208   CALCIUM 9.9 03/19/2020 1208   GFRNONAA >60 02/07/2019 1434   GFRAA >60  02/07/2019 1434    BNP  No results found for: PROBNP  Imaging: No results found.    PFT Results Latest Ref Rng & Units 01/22/2015 11/05/2014  FVC-Pre L 1.58 1.52  FVC-Predicted Pre % 53 51  FVC-Post L 1.63 1.60  FVC-Predicted Post % 55 54  Pre FEV1/FVC % % 37 32  Post FEV1/FCV % % 36 35  FEV1-Pre L 0.58 0.49  FEV1-Predicted Pre % 26 22  FEV1-Post L 0.59 0.56  DLCO uncorrected ml/min/mmHg 6.92 6.85  DLCO UNC% % 27 27  DLVA Predicted % 47 42    No results found for: NITRICOXIDE      Assessment & Plan:   Chronic respiratory failure with hypoxia and hypercapnia (HCC) Hypoxic and hypercarbic respiratory failure.  Patient with no increased oxygen demands.  Bicarb level appears to be stable at this time.  Discussed that going forward that BiPAP or noninvasive support might be an option to decrease hypercarbia.  Patient has not had any frequent COPD exacerbations and appears to be compensated at currently.  We will continue to monitor  Plan  Patient Instructions  Continue on Spiriva daily  Continue on Brovana and Pulmicort Neb Twice daily   Continue on Singulair daily .  ProAir 2 puffs every 4hrs as needed wheezing .  Activity as tolerated High-protein diet, may try ensure between meals.  Continue on 2.5L/m oxygen . (Pulsing device 4l/m )  Follow up with [name]  Or Parrett NP in 6 months and As needed       COPD GOLD IV  Compensated on present regimen.  No changes  Plan  Patient Instructions  Continue on Spiriva daily  Continue on Brovana and Pulmicort Neb Twice daily   Continue on Singulair daily .  ProAir 2 puffs every 4hrs as needed wheezing .  Activity as tolerated High-protein diet, may try ensure between meals.  Continue on 2.5L/m oxygen . (Pulsing device 4l/m )  Follow up with [name]  Or Parrett NP in 6 months and As needed       Protein calorie malnutrition [facility]) Patient is encouraged on high-protein diet.     [name], NP 03/25/2020

## 2020-03-25 NOTE — Progress Notes (Signed)
Left detailed voicemail

## 2020-04-01 LAB — HEPATITIS C ANTIBODY
Hepatitis C Ab: REACTIVE — AB
SIGNAL TO CUT-OFF: 1.2 — ABNORMAL HIGH (ref <1.00)

## 2020-04-01 LAB — HCV RNA,QUANTITATIVE REAL TIME PCR
HCV Quantitative Log: <1.18 Log IU/mL
HCV RNA, PCR, QN: <15 IU/mL

## 2020-04-01 NOTE — Telephone Encounter (Signed)
Patient has an appointment tomorrow with pcp. ?

## 2020-04-02 ENCOUNTER — Telehealth: Payer: Medicare Other | Admitting: Family Medicine

## 2020-04-04 NOTE — Progress Notes (Signed)
Patient is aware 

## 2020-04-09 DIAGNOSIS — J449 Chronic obstructive pulmonary disease, unspecified: Secondary | ICD-10-CM | POA: Diagnosis not present

## 2020-04-10 DIAGNOSIS — J449 Chronic obstructive pulmonary disease, unspecified: Secondary | ICD-10-CM | POA: Diagnosis not present

## 2020-05-09 DIAGNOSIS — J449 Chronic obstructive pulmonary disease, unspecified: Secondary | ICD-10-CM | POA: Diagnosis not present

## 2020-05-12 DIAGNOSIS — J449 Chronic obstructive pulmonary disease, unspecified: Secondary | ICD-10-CM | POA: Diagnosis not present

## 2020-06-09 DIAGNOSIS — J449 Chronic obstructive pulmonary disease, unspecified: Secondary | ICD-10-CM | POA: Diagnosis not present

## 2020-06-11 DIAGNOSIS — J449 Chronic obstructive pulmonary disease, unspecified: Secondary | ICD-10-CM | POA: Diagnosis not present

## 2020-07-08 DIAGNOSIS — J449 Chronic obstructive pulmonary disease, unspecified: Secondary | ICD-10-CM | POA: Diagnosis not present

## 2020-07-09 DIAGNOSIS — J449 Chronic obstructive pulmonary disease, unspecified: Secondary | ICD-10-CM | POA: Diagnosis not present

## 2020-07-15 ENCOUNTER — Other Ambulatory Visit: Payer: Self-pay | Admitting: Family Medicine

## 2020-07-15 DIAGNOSIS — J449 Chronic obstructive pulmonary disease, unspecified: Secondary | ICD-10-CM

## 2020-08-09 DIAGNOSIS — J449 Chronic obstructive pulmonary disease, unspecified: Secondary | ICD-10-CM | POA: Diagnosis not present

## 2020-08-11 DIAGNOSIS — J449 Chronic obstructive pulmonary disease, unspecified: Secondary | ICD-10-CM | POA: Diagnosis not present

## 2020-09-02 ENCOUNTER — Telehealth: Payer: Self-pay | Admitting: Internal Medicine

## 2020-09-02 MED ORDER — SPIRIVA RESPIMAT 2.5 MCG/ACT IN AERS: 2.0000 | INHALATION_SPRAY | Freq: Every day | RESPIRATORY_TRACT | 0 refills | Status: DC

## 2020-09-02 NOTE — Telephone Encounter (Signed)
Called and spoke with Patient.  Patient requested Spiriva 2.5 samples.  Spiriva samples placed at front desk for Patient pick up.  Nothing further at this time.

## 2020-09-09 DIAGNOSIS — J449 Chronic obstructive pulmonary disease, unspecified: Secondary | ICD-10-CM | POA: Diagnosis not present

## 2020-10-08 ENCOUNTER — Encounter: Payer: Self-pay | Admitting: Family Medicine

## 2020-10-08 ENCOUNTER — Ambulatory Visit (INDEPENDENT_AMBULATORY_CARE_PROVIDER_SITE_OTHER): Payer: Medicare Other | Admitting: Family Medicine

## 2020-10-08 ENCOUNTER — Other Ambulatory Visit: Payer: Self-pay

## 2020-10-08 VITALS — BP 148/62 | HR 84 | Temp 98.6°F | Wt 91.8 lb

## 2020-10-08 DIAGNOSIS — J449 Chronic obstructive pulmonary disease, unspecified: Secondary | ICD-10-CM

## 2020-10-08 DIAGNOSIS — I700 Atherosclerosis of aorta: Secondary | ICD-10-CM

## 2020-10-08 DIAGNOSIS — L720 Epidermal cyst: Secondary | ICD-10-CM

## 2020-10-08 DIAGNOSIS — Z23 Encounter for immunization: Secondary | ICD-10-CM

## 2020-10-08 DIAGNOSIS — E43 Unspecified severe protein-calorie malnutrition: Secondary | ICD-10-CM

## 2020-10-08 NOTE — Progress Notes (Signed)
Subjective:    Patient ID: Cathy Hensley, female    DOB: [birth year], 80 y.o.   MRN: [id number]  Chief Complaint  Patient presents with   Neck Pain    Hair folicle     HPI Patient was seen today for various concerns.  Patient inquires if there are any programs assistance with chores at home and help to check on her.  Patient able to bathe herself and do ADLs.  At times feels tired/short of breath after cooking or cleaning.  Patient is married but notes her husband is in denial about his own health challenges which make it difficult.  Patient inquires if someone can check on her when her daughter is out of town.    Patient notes gaining 1 pound since last OFV.  Trying to drink boost/other supplemental shakes.  States breathing is stable.  Currently on 2.5 L O2 via [place].  Pt inquires whether she should have a bump on the posterior left upper neck removed.  Previously advised area was a cyst.  Patient denies pain, erythema, drainage, enlargement of area.  Past Medical History:  Diagnosis Date   Alpha-1-antitrypsin deficiency carrier    COPD 07/13/2006   HYPERTENSION 07/13/2006   MENOPAUSAL SYNDROME 07/13/2006   Other and unspecified hyperlipidemia 07/13/2006   Respiratory failure (HCC)    Chronic hypoxic    No Known Allergies  ROS General: Denies fever, chills, night sweats, changes in appetite + changes in weight HEENT: Denies headaches, ear pain, changes in vision, rhinorrhea, sore throat CV: Denies CP, palpitations, SOB, orthopnea Pulm: Denies SOB, cough, wheezing GI: Denies abdominal pain, nausea, vomiting, diarrhea, constipation GU: Denies dysuria, hematuria, frequency, vaginal discharge Msk: Denies muscle cramps, joint pains Neuro: Denies weakness, numbness, tingling Skin: Denies rashes, bruising  + cyst on posterior neck Psych: Denies depression, anxiety, hallucinations     Objective:    Blood pressure (!) 148/62, pulse 84, temperature 98.6 F (37 C), temperature source Oral,  weight 91 lb 12.8 oz (41.6 kg), SpO2 97 %.  Gen. Pleasant, well-nourished, in no distress, on 2.5 L O2 via [place], normal affect   HEENT: /AT, face symmetric, conjunctiva clear, no scleral icterus, PERRLA, EOMI, nares patent without drainage Lungs: no accessory muscle use, CTAB, no wheezes or rales Cardiovascular: RRR, no m/r/g, no peripheral edema Abdomen: BS present, soft, NT/ND, no hepatosplenomegaly. Musculoskeletal: No deformities, no cyanosis or clubbing, normal tone Neuro:  A&Ox3, CN II-XII intact, normal gait Skin:  Warm, no lesions/ rash.  A 1.5 cm epidermoid cyst on posterior left upper neck and occipital area within scalp, no drainage or erythema noted.  Clubbing of fingers.  Wt Readings from Last 3 Encounters:  03/25/20 90 lb 6.4 oz (41 kg)  03/19/20 88 lb (39.9 kg)  09/21/19 93 lb 12.8 oz (42.5 kg)    Lab Results  Component Value Date   WBC 4.9 03/19/2020   HGB 12.1 03/19/2020   HCT 36.8 03/19/2020   PLT 159.0 03/19/2020   GLUCOSE 78 03/19/2020   CHOL 181 03/19/2020   TRIG 97.0 03/19/2020   HDL 68.70 03/19/2020   LDLDIRECT 127.9 10/19/2006   LDLCALC 93 03/19/2020   ALT 15 03/19/2020   AST 22 03/19/2020   NA 145 03/19/2020   K 4.1 03/19/2020   CL 97 03/19/2020   CREATININE 0.57 03/19/2020   BUN 25 (H) 03/19/2020   CO2 34 (H) 03/19/2020   TSH 0.55 03/19/2020   HGBA1C 5.4 03/19/2020    Assessment/Plan:  COPD GOLD IV  -  Stable -Continue 2.5 L O2 via [place] continuously -Continue Brovana nebulizer solution twice daily, Pulmicort nebulizer solution twice daily, Spiriva Respimat daily, albuterol inhaler as needed -Given information about area home health agencies that provide nurse aides -Continue follow-up with pulmonology, [name]  Severe protein-calorie malnutrition (HCC) -BMI 15.04 -Continue dietary supplement drinks and eating frequent meals and snacks throughout the day  Need for influenza vaccination  - Plan: Flu Vaccine QUAD High  Dose(Fluad)  Epidermoid cyst of the neck -Stable -Discussed removal.  Patient wishes to wait at this time.  Atherosclerosis of aorta (HCC) -Continue lifestyle modifications -Continue Zocor 20 mg daily  F/u.prn  Abbe Amsterdam, MD

## 2020-10-09 ENCOUNTER — Telehealth: Payer: Self-pay

## 2020-10-09 ENCOUNTER — Encounter: Payer: Self-pay | Admitting: Family Medicine

## 2020-10-09 DIAGNOSIS — J449 Chronic obstructive pulmonary disease, unspecified: Secondary | ICD-10-CM

## 2020-10-09 MED ORDER — BUDESONIDE 0.25 MG/2ML IN SUSP: RESPIRATORY_TRACT | 11 refills | Status: DC

## 2020-10-09 MED ORDER — ARFORMOTEROL TARTRATE 15 MCG/2ML IN NEBU: INHALATION_SOLUTION | RESPIRATORY_TRACT | 11 refills | Status: DC

## 2020-10-09 MED ORDER — ALBUTEROL SULFATE HFA 108 (90 BASE) MCG/ACT IN AERS: INHALATION_SPRAY | RESPIRATORY_TRACT | 3 refills | Status: DC

## 2020-10-09 NOTE — Addendum Note (Signed)
Addended by: Abbe Amsterdam R on: 10/09/2020 05:51 PM   Modules accepted: Orders

## 2020-10-09 NOTE — Telephone Encounter (Signed)
Patient called requesting Rx refill  budesonide (PULMICORT) 0.25 MG/2ML nebulizer solution albuterol (VENTOLIN HFA) 108 (90 Base) MCG/ACT inhaler arformoterol (BROVANA) 15 MCG/2ML NEBU Pt has 7 day supply left Pt also asked if sunglasses were found and if so call her and she will pick them up

## 2020-10-09 NOTE — Telephone Encounter (Signed)
Patient called as a follow up regarding prescriptions being sent. I let her know request had been sent to Dr.Banks, but her and the team were seeing patients right now. She ask for a call back this afternoon to let her know they have been sent in     Good callback number is [phone]     Please Advise

## 2020-10-10 MED ORDER — ALBUTEROL SULFATE HFA 108 (90 BASE) MCG/ACT IN AERS: INHALATION_SPRAY | RESPIRATORY_TRACT | 3 refills | Status: DC

## 2020-10-10 MED ORDER — ARFORMOTEROL TARTRATE 15 MCG/2ML IN NEBU: INHALATION_SOLUTION | RESPIRATORY_TRACT | 11 refills | Status: DC

## 2020-10-10 MED ORDER — BUDESONIDE 0.25 MG/2ML IN SUSP: RESPIRATORY_TRACT | 11 refills | Status: DC

## 2020-10-10 NOTE — Telephone Encounter (Signed)
Refills sent to [facility] pharmacy, per patient.

## 2020-10-10 NOTE — Addendum Note (Signed)
Addended by: Elwin Mocha on: 10/10/2020 05:03 PM   Modules accepted: Orders

## 2020-10-10 NOTE — Telephone Encounter (Signed)
Pt is aware refills have been sent in.  

## 2020-10-10 NOTE — Telephone Encounter (Signed)
Pharmacy called requesting Rx be sent to [facility] mail pharmacy Patient has called [facility] to cancel  Encounter from 10/6 medications

## 2020-10-14 DIAGNOSIS — J449 Chronic obstructive pulmonary disease, unspecified: Secondary | ICD-10-CM | POA: Diagnosis not present

## 2020-11-06 DIAGNOSIS — J449 Chronic obstructive pulmonary disease, unspecified: Secondary | ICD-10-CM | POA: Diagnosis not present

## 2020-11-09 DIAGNOSIS — J449 Chronic obstructive pulmonary disease, unspecified: Secondary | ICD-10-CM | POA: Diagnosis not present

## 2021-01-06 DIAGNOSIS — J449 Chronic obstructive pulmonary disease, unspecified: Secondary | ICD-10-CM | POA: Diagnosis not present

## 2021-01-09 DIAGNOSIS — J449 Chronic obstructive pulmonary disease, unspecified: Secondary | ICD-10-CM | POA: Diagnosis not present

## 2021-01-14 ENCOUNTER — Telehealth: Payer: Self-pay | Admitting: Family Medicine

## 2021-01-14 NOTE — Telephone Encounter (Signed)
Left message for patient to call back and schedule Medicare Annual Wellness Visit (AWV) either virtually or in office. Left  my Cathy Hensley number [phone]   Last AWVi 02/04/20  ; please schedule at anytime with [facility] Nurse Health Advisor 1 or 2   This should be a 45 minute visit.

## 2021-01-27 ENCOUNTER — Ambulatory Visit (INDEPENDENT_AMBULATORY_CARE_PROVIDER_SITE_OTHER): Payer: Medicare Other

## 2021-01-27 VITALS — Ht 65.0 in | Wt 91.0 lb

## 2021-01-27 DIAGNOSIS — Z0000 Encounter for general adult medical examination without abnormal findings: Secondary | ICD-10-CM | POA: Diagnosis not present

## 2021-01-27 NOTE — Progress Notes (Signed)
Subjective:   Cathy Hensley is a 81 y.o. female who presents for Medicare Annual (Subsequent) preventive examination.  Review of Systems    No ROS       Objective:    Today's Vitals   01/27/21 1121  Weight: 91 lb (41.3 kg)  Height: 5\' 5"  (1.651 m)   Body mass index is 15.14 kg/m.  Advanced Directives 01/27/2021 02/04/2020 06/27/2019 02/07/2019 01/10/2015 09/24/2014 09/03/2014  Does Patient Have a Medical Advance Directive? Yes Yes Yes No Yes Yes No  Type of 09/05/2014 of [place];Living will Healthcare Power of [place];Living will Healthcare Power of [place];Living will - Healthcare Power of [place];Living will Healthcare Power of [place];Living will -  Does patient want to make changes to medical advance directive? No - Patient declined No - Patient declined No - Patient declined - No - Patient declined - -  Copy of Healthcare Power of Attorney in Chart? No - copy requested No - copy requested No - copy requested - No - copy requested - -  Would patient like information on creating a medical advance directive? - - - No - Patient declined - - No - patient declined information    Current Medications (verified) Outpatient Encounter Medications as of 01/27/2021  Medication Sig   acetaminophen (TYLENOL) 500 MG tablet Take 500 mg by mouth every 6 (six) hours as needed.   albuterol (VENTOLIN HFA) 108 (90 Base) MCG/ACT inhaler INHALE 2 PUFFS INTO THE LUNGS EVERY 6 HOURS AS NEEDED FOR WHEEZING OR SHORTNESS OF BREATH   amLODipine (NORVASC) 5 MG tablet Take 1 tablet (5 mg total) by mouth daily.   arformoterol (BROVANA) 15 MCG/2ML NEBU USE 1 VIAL  IN  NEBULIZER TWICE  DAILY - morning and evening   aspirin 81 MG chewable tablet Chew 1 tablet (81 mg total) by mouth daily.   budesonide (PULMICORT) 0.25 MG/2ML nebulizer solution Use 1 vial in nebulizer twice daily.  Once mouth after treatment.   dextromethorphan-guaiFENesin (MUCINEX DM) 30-600 MG 12hr tablet Take 1 tablet  by mouth 2 (two) times daily.   montelukast (SINGULAIR) 10 MG tablet Take 1 tablet (10 mg total) by mouth at bedtime.   Multiple Vitamins-Minerals (CENTRUM SILVER PO) Take 1 tablet by mouth daily.   OXYGEN 2.5 lpm 24/7 (she uses 4lpm pulsed when on POC)   Respiratory Therapy Supplies (FLUTTER) DEVI 1 Device by Does not apply route as needed.   simvastatin (ZOCOR) 20 MG tablet TAKE 1 TABLET(20 MG) BY MOUTH AT BEDTIME   Tiotropium Bromide Monohydrate (SPIRIVA RESPIMAT) 2.5 MCG/ACT AERS Inhale 2 puffs into the lungs daily.   Tiotropium Bromide Monohydrate (SPIRIVA RESPIMAT) 2.5 MCG/ACT AERS Inhale 2 puffs into the lungs daily.   No facility-administered encounter medications on file as of 01/27/2021.    Allergies (verified) Patient has no known allergies.   History: Past Medical History:  Diagnosis Date   Alpha-1-antitrypsin deficiency carrier    COPD 07/13/2006   HYPERTENSION 07/13/2006   MENOPAUSAL SYNDROME 07/13/2006   Other and unspecified hyperlipidemia 07/13/2006   Respiratory failure (HCC)    Chronic hypoxic   Past Surgical History:  Procedure Laterality Date   TONSILLECTOMY     Family History  Problem Relation Age of Onset   AAA (abdominal aortic aneurysm) Mother    Emphysema Mother    Aneurysm Sister        brain   Emphysema Father    Lymphoma Sister    Social History   Socioeconomic History  Marital status: Married    Spouse name: Not on file   Number of children: 2   Years of education: Not on file   Highest education level: Not on file  Occupational History   Occupation: retired  Tobacco Use   Smoking status: Former    Packs/day: 0.50    Years: 42.00    Pack years: 21.00    Types: Cigarettes    Quit date: 09/23/2000    Years since quitting: 20.3   Smokeless tobacco: Never  Substance and Sexual Activity   Alcohol use: Yes    Alcohol/week: 1.0 standard drink    Types: 1 Standard drinks or equivalent per week    Comment: 1 glass of wine nearly daily.   Drug  use: No   Sexual activity: Not on file  Other Topics Concern   Not on file  Social History Narrative   Lives at home with her husband. Retired.      [facility]:   She is originally from [place]. She has 2 daughters, one lives in [place] & the other in [place]. Previously worked Clinical cytogeneticist a Musician, ran a Lawyer, clerical work, Lawyer. She has no pets currently. No previous bird, mold, or hot tub exposure.    Social Determinants of Health   Financial Resource Strain: Low Risk    Difficulty of Paying Living Expenses: Not hard at all  Food Insecurity: No Food Insecurity   Worried About Programme researcher, broadcasting/film/video in the Last Year: Never true   Ran Out of Food in the Last Year: Never true  Transportation Needs: No Transportation Needs   Lack of Transportation (Medical): No   Lack of Transportation (Non-Medical): No  Physical Activity: Insufficiently Active   Days of Exercise per Week: 2 days   Minutes of Exercise per Session: 10 min  Stress: No Stress Concern Present   Feeling of Stress : Not at all  Social Connections: Socially Integrated   Frequency of Communication with Friends and Family: More than three times a week   Frequency of Social Gatherings with Friends and Family: More than three times a week   Attends Religious Services: More than 4 times per year   Active Member of [employer] or Organizations: Yes   Attends Engineer, structural: More than 4 times per year   Marital Status: Married     Clinical Intake:  Pre-visit preparation completed: Yes Diabetic? No   Activities of Daily Living In your present state of health, do you have any difficulty performing the following activities: 01/27/2021 02/04/2020  Hearing? N N  Vision? N N  Difficulty concentrating or making decisions? N N  Walking or climbing stairs? N N  Dressing or bathing? N N  Doing errands, shopping? N N  Preparing Food and eating ? N N  Comment Husband Assist -   Using the Toilet? N N  In the past six months, have you accidently leaked urine? N N  Do you have problems with loss of bowel control? N N  Managing your Medications? N N  Managing your Finances? N N  Housekeeping or managing your Housekeeping? - N  Some recent data might be hidden    Patient Care Team: Deeann Saint, MD as PCP - General (Family Medicine) Alfredo Martinez, MD as Consulting Physician (Urology) Nyoka Cowden, MD as Consulting Physician (Pulmonary Disease)  Indicate any recent Medical Services you may have received from other than [facility] providers in the past year (date  may be approximate).     Assessment:   This is a routine wellness examination for Falesha. Virtual Visit via Telephone Note  I connected with  Dan E Elpers on 01/27/21 at 11:15 AM EST by telephone and verified that I am speaking with the correct person using two identifiers.  Location: Patient: Home Provider: Office Persons participating in the virtual visit: patient/Nurse Health Advisor   I discussed the limitations, risks, security and privacy concerns of performing an evaluation and management service by telephone and the availability of in person appointments. The patient expressed understanding and agreed to proceed.  Interactive audio and video telecommunications were attempted between this nurse and patient, however failed, due to patient having technical difficulties OR patient did not have access to video capability.  We continued and completed visit with audio only.  Some vital signs may be absent or patient reported.   Tillie Rung, LPN   Hearing/Vision screen Hearing Screening - Comments:: No difficulty hearing Vision Screening - Comments:: Wears reading glasses. Followed by Dr Tobias Alexander  Dietary issues and exercise activities discussed: Current Exercise Habits: Home exercise routine, Type of exercise: walking, Time (Minutes): 20, Frequency (Times/Week): 3, Weekly  Exercise (Minutes/Week): 60, Intensity: Mild   Goals Addressed             This Visit's Progress    Exercise 3x per week (30 min per time)         Depression Screen PHQ 2/9 Scores 01/27/2021 02/04/2020 04/10/2015 01/10/2015 12/16/2014 01/19/2013  PHQ - 2 Score 0 0 0 2 0 0  PHQ- 9 Score - - - 4 - -    Fall Risk Fall Risk  01/27/2021 02/04/2020 01/10/2015 12/16/2014 01/19/2013  Falls in the past year? 0 0 No No No  Number falls in past yr: 0 0 - - -  Injury with Fall? 0 0 - - -  Risk for fall due to : - No Fall Risks - - -  Follow up - Falls evaluation completed;Falls prevention discussed - - -    FALL RISK PREVENTION PERTAINING TO THE HOME:  Any stairs in or around the home? Yes  If so, are there any without handrails? No  Home free of loose throw rugs in walkways, pet beds, electrical cords, etc? Yes  Adequate lighting in your home to reduce risk of falls? Yes   ASSISTIVE DEVICES UTILIZED TO PREVENT FALLS:  Life alert? No  Use of a cane, walker or w/c? No  Grab bars in the bathroom? No  Shower chair or bench in shower? No  Elevated toilet seat or a handicapped toilet? No   TIMED UP AND GO:  Was the test performed? No . Audio Visit  Cognitive Function:   6CIT Screen 01/27/2021  What Year? 0 points  What month? 0 points  What time? 0 points  Count back from 20 0 points  Months in reverse 0 points  Repeat phrase 0 points  Total Score 0    Immunizations Immunization History  Administered Date(s) Administered   Fluad Quad(high Dose 65+) 09/21/2018, 09/21/2019, 10/08/2020   Influenza Split 10/05/2010, 11/15/2012   Influenza, High Dose Seasonal PF 09/21/2013, 10/01/2014, 09/06/2017   Influenza,inj,Quad PF,6+ Mos 08/27/2016   Influenza-Unspecified 08/24/2015   PFIZER(Purple Top)SARS-COV-2 Vaccination 03/03/2019, 03/20/2019, 10/04/2019, 10/23/2020   Pneumococcal Conjugate-13 01/19/2013   Pneumococcal Polysaccharide-23 01/04/2005, 03/19/2010   Td 09/05/2003   Tdap  06/29/2010, 02/07/2019    Covid-19 vaccine status: Completed vaccines  Screening Tests Health Maintenance  Topic  Date Due   COVID-19 Vaccine (5 - Booster for Pfizer series) 12/18/2020   MAMMOGRAM  02/03/2021 (Originally 04/12/1958)   DEXA SCAN  02/03/2021 (Originally 04/12/2006)   Zoster Vaccines- Shingrix (1 of 2) 04/27/2021 (Originally 04/12/1991)   TETANUS/TDAP  02/06/2029   Pneumonia Vaccine 61+ Years old  Completed   INFLUENZA VACCINE  Completed   Hepatitis C Screening  Completed   HPV VACCINES  Aged Out    Health Maintenance  Shingles Vaccine: Qualify Yes. Completed No Education provider  Additional Screening:  Vision Screening: Recommended annual ophthalmology exams for early detection of glaucoma and other disorders of the eye. Is the patient up to date with their annual eye exam?  Yes  Who is the provider or what is the name of the office in which the patient attends annual eye exams? Followed by Dr [name]    Dental Screening: Recommended annual dental exams for proper oral hygiene  Community Resource Referral / Chronic Care Management:  CRR required this visit?  No   CCM required this visit?  No      Plan:     I have personally reviewed and noted the following in the patients chart:   Medical and social history Use of alcohol, tobacco or illicit drugs  Current medications and supplements including opioid prescriptions. Patient currently not taking opioids Functional ability and status Nutritional status Physical activity Advanced directives List of other physicians Hospitalizations, surgeries, and ER visits in previous 12 months Vitals Screenings to include cognitive, depression, and falls Referrals and appointments  In addition, I have reviewed and discussed with patient certain preventive protocols, quality metrics, and best practice recommendations. A written personalized care plan for preventive services as well as general preventive health  recommendations were provided to patient.     Tillie Rung, LPN   [id number]

## 2021-01-27 NOTE — Patient Instructions (Addendum)
Cathy Hensley , Thank you for taking time to come for your Medicare Wellness Visit. I appreciate your ongoing commitment to your health goals. Please review the following plan we discussed and let me know if I can assist you in the future.   These are the goals we discussed:  Goals      Exercise 3x per week (30 min per time)        This is a list of the screening recommended for you and due dates:  Health Maintenance  Topic Date Due   COVID-19 Vaccine (5 - Booster for Pfizer series) 12/18/2020   Mammogram  02/03/2021*   DEXA scan (bone density measurement)  02/03/2021*   Zoster (Shingles) Vaccine (1 of 2) 04/27/2021*   Tetanus Vaccine  02/06/2029   Pneumonia Vaccine  Completed   Flu Shot  Completed   Hepatitis C Screening: USPSTF Recommendation to screen - Ages 18-79 yo.  Completed   HPV Vaccine  Aged Out  *Topic was postponed. The date shown is not the original due date.   Advanced directives: Yes  Conditions/risks identified: None  Next appointment: Follow up in one year for your annual wellness visit    Preventive Care 65 Years and Older, Female Preventive care refers to lifestyle choices and visits with your health care provider that can promote health and wellness. What does preventive care include? A yearly physical exam. This is also called an annual well check. Dental exams once or twice a year. Routine eye exams. Ask your health care provider how often you should have your eyes checked. Personal lifestyle choices, including: Daily care of your teeth and gums. Regular physical activity. Eating a healthy diet. Avoiding tobacco and drug use. Limiting alcohol use. Practicing safe sex. Taking low-dose aspirin every day. Taking vitamin and mineral supplements as recommended by your health care provider. What happens during an annual well check? The services and screenings done by your health care provider during your annual well check will depend on your age, overall  health, lifestyle risk factors, and family history of disease. Counseling  Your health care provider may ask you questions about your: Alcohol use. Tobacco use. Drug use. Emotional well-being. Home and relationship well-being. Sexual activity. Eating habits. History of falls. Memory and ability to understand (cognition). Work and work Astronomer. Reproductive health. Screening  You may have the following tests or measurements: Height, weight, and BMI. Blood pressure. Lipid and cholesterol levels. These may be checked every 5 years, or more frequently if you are over 40 years old. Skin check. Lung cancer screening. You may have this screening every year starting at age 52 if you have a 30-Hensley-year history of smoking and currently smoke or have quit within the past 15 years. Fecal occult blood test (FOBT) of the stool. You may have this test every year starting at age 90. Flexible sigmoidoscopy or colonoscopy. You may have a sigmoidoscopy every 5 years or a colonoscopy every 10 years starting at age 69. Hepatitis C blood test. Hepatitis B blood test. Sexually transmitted disease (STD) testing. Diabetes screening. This is done by checking your blood sugar (glucose) after you have not eaten for a while (fasting). You may have this done every 1-3 years. Bone density scan. This is done to screen for osteoporosis. You may have this done starting at age 47. Mammogram. This may be done every 1-2 years. Talk to your health care provider about how often you should have regular mammograms. Talk with your health care provider about  your test results, treatment options, and if necessary, the need for more tests. Vaccines  Your health care provider may recommend certain vaccines, such as: Influenza vaccine. This is recommended every year. Tetanus, diphtheria, and acellular pertussis (Tdap, Td) vaccine. You may need a Td booster every 10 years. Zoster vaccine. You may need this after age  22. Pneumococcal 13-valent conjugate (PCV13) vaccine. One dose is recommended after age 46. Pneumococcal polysaccharide (PPSV23) vaccine. One dose is recommended after age 101. Talk to your health care provider about which screenings and vaccines you need and how often you need them. This information is not intended to replace advice given to you by your health care provider. Make sure you discuss any questions you have with your health care provider. Document Released: 01/17/2015 Document Revised: 09/10/2015 Document Reviewed: 10/22/2014 Elsevier Interactive Patient Education  2017 [place] Prevention in the Home Falls can cause injuries. They can happen to people of all ages. There are many things you can do to make your home safe and to help prevent falls. What can I do on the outside of my home? Regularly fix the edges of walkways and driveways and fix any cracks. Remove anything that might make you trip as you walk through a door, such as a raised step or threshold. Trim any bushes or trees on the path to your home. Use bright outdoor lighting. Clear any walking paths of anything that might make someone trip, such as rocks or tools. Regularly check to see if handrails are loose or broken. Make sure that both sides of any steps have handrails. Any raised decks and porches should have guardrails on the edges. Have any leaves, snow, or ice cleared regularly. Use sand or salt on walking paths during winter. Clean up any spills in your garage right away. This includes oil or grease spills. What can I do in the bathroom? Use night lights. Install grab bars by the toilet and in the tub and shower. Do not use towel bars as grab bars. Use non-skid mats or decals in the tub or shower. If you need to sit down in the shower, use a plastic, non-slip stool. Keep the floor dry. Clean up any water that spills on the floor as soon as it happens. Remove soap buildup in the tub or shower  regularly. Attach bath mats securely with double-sided non-slip rug tape. Do not have throw rugs and other things on the floor that can make you trip. What can I do in the bedroom? Use night lights. Make sure that you have a light by your bed that is easy to reach. Do not use any sheets or blankets that are too big for your bed. They should not hang down onto the floor. Have a firm chair that has side arms. You can use this for support while you get dressed. Do not have throw rugs and other things on the floor that can make you trip. What can I do in the kitchen? Clean up any spills right away. Avoid walking on wet floors. Keep items that you use a lot in easy-to-reach places. If you need to reach something above you, use a strong step stool that has a grab bar. Keep electrical cords out of the way. Do not use floor polish or wax that makes floors slippery. If you must use wax, use non-skid floor wax. Do not have throw rugs and other things on the floor that can make you trip. What can I do with  my stairs? Do not leave any items on the stairs. Make sure that there are handrails on both sides of the stairs and use them. Fix handrails that are broken or loose. Make sure that handrails are as long as the stairways. Check any carpeting to make sure that it is firmly attached to the stairs. Fix any carpet that is loose or worn. Avoid having throw rugs at the top or bottom of the stairs. If you do have throw rugs, attach them to the floor with carpet tape. Make sure that you have a light switch at the top of the stairs and the bottom of the stairs. If you do not have them, ask someone to add them for you. What else can I do to help prevent falls? Wear shoes that: Do not have high heels. Have rubber bottoms. Are comfortable and fit you well. Are closed at the toe. Do not wear sandals. If you use a stepladder: Make sure that it is fully opened. Do not climb a closed stepladder. Make sure that  both sides of the stepladder are locked into place. Ask someone to hold it for you, if possible. Clearly mark and make sure that you can see: Any grab bars or handrails. First and last steps. Where the edge of each step is. Use tools that help you move around (mobility aids) if they are needed. These include: Canes. Walkers. Scooters. Crutches. Turn on the lights when you go into a dark area. Replace any light bulbs as soon as they burn out. Set up your furniture so you have a clear path. Avoid moving your furniture around. If any of your floors are uneven, fix them. If there are any pets around you, be aware of where they are. Review your medicines with your doctor. Some medicines can make you feel dizzy. This can increase your chance of falling. Ask your doctor what other things that you can do to help prevent falls. This information is not intended to replace advice given to you by your health care provider. Make sure you discuss any questions you have with your health care provider. Document Released: 10/17/2008 Document Revised: 05/29/2015 Document Reviewed: 01/25/2014 Elsevier Interactive Patient Education  2017 [employer].

## 2021-02-04 ENCOUNTER — Other Ambulatory Visit: Payer: Self-pay | Admitting: Internal Medicine

## 2021-02-04 DIAGNOSIS — J449 Chronic obstructive pulmonary disease, unspecified: Secondary | ICD-10-CM | POA: Diagnosis not present

## 2021-02-09 ENCOUNTER — Other Ambulatory Visit: Payer: Self-pay | Admitting: Internal Medicine

## 2021-02-09 DIAGNOSIS — J449 Chronic obstructive pulmonary disease, unspecified: Secondary | ICD-10-CM | POA: Diagnosis not present

## 2021-02-10 ENCOUNTER — Ambulatory Visit: Payer: Medicare Other | Admitting: Adult Health

## 2021-02-10 ENCOUNTER — Other Ambulatory Visit: Payer: Self-pay

## 2021-02-10 ENCOUNTER — Encounter: Payer: Self-pay | Admitting: Adult Health

## 2021-02-10 ENCOUNTER — Telehealth: Payer: Self-pay | Admitting: Internal Medicine

## 2021-02-10 ENCOUNTER — Ambulatory Visit (INDEPENDENT_AMBULATORY_CARE_PROVIDER_SITE_OTHER): Payer: Medicare Other

## 2021-02-10 VITALS — BP 160/70 | HR 96 | Temp 98.6°F | Ht 65.0 in | Wt 88.8 lb

## 2021-02-10 DIAGNOSIS — J449 Chronic obstructive pulmonary disease, unspecified: Secondary | ICD-10-CM

## 2021-02-10 DIAGNOSIS — E43 Unspecified severe protein-calorie malnutrition: Secondary | ICD-10-CM

## 2021-02-10 DIAGNOSIS — J9612 Chronic respiratory failure with hypercapnia: Secondary | ICD-10-CM

## 2021-02-10 DIAGNOSIS — J9611 Chronic respiratory failure with hypoxia: Secondary | ICD-10-CM | POA: Diagnosis not present

## 2021-02-10 DIAGNOSIS — J90 Pleural effusion, not elsewhere classified: Secondary | ICD-10-CM | POA: Diagnosis not present

## 2021-02-10 MED ORDER — SPIRIVA RESPIMAT 2.5 MCG/ACT IN AERS: 2.0000 | INHALATION_SPRAY | Freq: Every day | RESPIRATORY_TRACT | 0 refills | Status: DC

## 2021-02-10 NOTE — Assessment & Plan Note (Signed)
Continue on current oxygen to maintain O2 saturations greater than 88 to 90%

## 2021-02-10 NOTE — Assessment & Plan Note (Signed)
High-protein diet.  Encouraged on Ensure or boost used between meals.

## 2021-02-10 NOTE — Telephone Encounter (Signed)
Called and spoke with patient. She was calling for a refill on her Spiriva 2.29mcg inhaler. She wishes to have this sent to [facility] on [address]. She is scheduled for a F/U today at 4pm with TP. I advised her that I would send in a refill for her but that she still needed to keep her appt. She verbalized understanding.   Nothing further needed at time of call.

## 2021-02-10 NOTE — Addendum Note (Signed)
Addended by: Delrae Rend on: 02/10/2021 05:23 PM   Modules accepted: Orders

## 2021-02-10 NOTE — Assessment & Plan Note (Signed)
Severe COPD compensated on present regimen. Patient does run into issues towards the end of the year with medication affordability.  We did discuss changing Spiriva over to ipratropium nebs if needed to help with cost. Check chest x-ray today.  Influenza and COVID vaccines are up-to-date  Plan  Patient Instructions  Continue on Spiriva daily  Continue on Brovana and Pulmicort Neb Twice daily   Continue on Singulair daily .  ProAir 2 puffs every 4hrs as needed wheezing .  Activity as tolerated High-protein diet, may try ensure between meals.  Continue on 2.5L/m oxygen . (Pulsing device 4l/m )  Chest xray today .  Follow up with Dr. Melvyn Novas  Or Amed Datta NP in  1 year and As needed

## 2021-02-10 NOTE — Progress Notes (Signed)
@Patient  ID: Cathy Hensley, female    DOB: 07-Feb-1940, 81 y.o.   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 No focal opacity or mass appreciated. No pleural effusion or thickening appreciated. Heart normal in size. Mediastinum normal in contour.   CARDIAC TTE (09/04/14): LV normal in size. Moderate LVH. EF 60-65%. No regional wall motion abnormalities. LA & RA normal in size. RV normal in size and function. Pulmonary artery systolic pressure 30 mmHg. No aortic stenosis or regurgitation. Normal aortic root. Mild mitral regurgitation. No pulmonic regurgitation. Trivial tricuspid regurgitation. Trivial pericardial effusion.   MICROBIOLOGY Sputum (09/06/14):  Oral flora Urine Strep Ag (09/06/14):  Negative Urine Legionella Ag (09/06/14):  Negative Influenza A/B PCR (09/06/14):  Negative    LABS 11/06/14 Alpha-1 antitrypsin: MZ (88)   09/06/14 ABG on 3 L/m:  7.40 / 65 / 63 / 91% CBC: 11.5/14.2/46.3/240 BMP: 140/4.4/99/41/19/0.38/118/9.2 LFT: 3.7/6.3/0.5/61/20/21  02/10/2021 Follow up : COPD and oxygen dependent respiratory failure Patient presents for a follow-up visit.  Last seen March 2022.  Patient has underlying severe COPD that is oxygen dependent.  She  remains on Pulmicort and Brovana nebulizer twice daily.  She takes Spiriva inhaler daily.  She remains on oxygen 2.5 L continuously.  Patient says overall she is doing about the same.  She gets short of breath with minimum activities.  Has intermittent dry cough. She denies any increased oxygen demands.  Tries to remain active at home, gets winded easily . Does cleaning, cooking  at home.  Flu and COVID vaccines are up-to-date. Weight is down a couple pounds at 88 pounds.  We discussed a high-protein diet.  Wants to change Spiriva to Ipratropium neb later this year when she hits the doughnut hole.    No Known Allergies  Immunization History  Administered Date(s) Administered   Fluad Quad(high Dose 65+) 09/21/2018, 09/21/2019, 10/08/2020   Influenza Split 10/05/2010, 11/15/2012   Influenza, High Dose Seasonal PF 09/21/2013, 10/01/2014, 09/06/2017   Influenza,inj,Quad PF,6+ Mos 08/27/2016   Influenza-Unspecified 08/24/2015   PFIZER(Purple Top)SARS-COV-2 Vaccination 03/03/2019, 03/20/2019, 10/04/2019, 10/23/2020   Pneumococcal Conjugate-13 01/19/2013   Pneumococcal Polysaccharide-23 01/04/2005, 03/19/2010   Td 09/05/2003   Tdap 06/29/2010, 02/07/2019    Past Medical History:  Diagnosis Date   Alpha-1-antitrypsin deficiency carrier    COPD 07/13/2006   HYPERTENSION 07/13/2006   MENOPAUSAL SYNDROME 07/13/2006   Other and unspecified hyperlipidemia 07/13/2006   Respiratory failure (HCC)    Chronic hypoxic    Tobacco History: Social History   Tobacco Use  Smoking Status Former   Packs/day: 0.50   Years: 42.00   Pack years: 21.00   Types: Cigarettes   Quit date: 09/23/2000   Years since quitting: 20.3  Smokeless Tobacco Never   Counseling given:  Not Answered   Outpatient Medications Prior to Visit  Medication Sig Dispense Refill   acetaminophen (TYLENOL) 500 MG tablet Take 500 mg by mouth every 6 (six) hours as needed.     albuterol (VENTOLIN HFA) 108 (90 Base) MCG/ACT inhaler  INHALE 2 PUFFS INTO THE LUNGS EVERY 6 HOURS AS NEEDED FOR WHEEZING OR SHORTNESS OF BREATH 8.5 g 3   amLODipine (NORVASC) 5 MG tablet Take 1 tablet (5 mg total) by mouth daily. 90 tablet 3   arformoterol (BROVANA) 15 MCG/2ML NEBU USE 1 VIAL  IN  NEBULIZER TWICE  DAILY - morning and evening 60 mL 11   aspirin 81 MG chewable tablet Chew 1 tablet (81 mg total) by mouth daily.     budesonide (PULMICORT) 0.25 MG/2ML nebulizer solution Use 1 vial in nebulizer twice daily.  Once mouth after treatment. 60 mL 11   dextromethorphan-guaiFENesin (MUCINEX DM) 30-600 MG 12hr tablet Take 1 tablet by mouth 2 (two) times daily.     montelukast (SINGULAIR) 10 MG tablet Take 1 tablet (10 mg total) by mouth at bedtime. 90 tablet 3   Multiple Vitamins-Minerals (CENTRUM SILVER PO) Take 1 tablet by mouth daily.     OXYGEN 2.5 lpm 24/7 (she uses 4lpm pulsed when on POC)     Respiratory Therapy Supplies (FLUTTER) DEVI 1 Device by Does not apply route as needed. 1 each 0   simvastatin (ZOCOR) 20 MG tablet TAKE 1 TABLET(20 MG) BY MOUTH AT BEDTIME 90 tablet 3   Tiotropium Bromide Monohydrate (SPIRIVA RESPIMAT) 2.5 MCG/ACT AERS Inhale 2 puffs into the lungs daily. 4 g 11   Tiotropium Bromide Monohydrate (SPIRIVA RESPIMAT) 2.5 MCG/ACT AERS Inhale 2 puffs into the lungs daily. 4 g 0   No facility-administered medications prior to visit.     Review of Systems:   Constitutional:   No  weight loss, night sweats,  Fevers, chills,  +fatigue, or  lassitude.  HEENT:   No headaches,  Difficulty swallowing,  Tooth/dental problems, or  Sore throat,                No sneezing, itching, ear ache, nasal congestion, post nasal drip,   CV:  No chest pain,  Orthopnea, PND, swelling in lower extremities, anasarca, dizziness, palpitations, syncope.   GI  No heartburn, indigestion, abdominal pain, nausea, vomiting, diarrhea, change in bowel habits, loss of appetite, bloody stools.   Resp:   No excess mucus, no productive cough,  No  non-productive cough,  No coughing up of blood.  No change in color of mucus.  No wheezing.  No chest wall deformity  Skin: no rash or lesions.  GU: no dysuria, change in color of urine, no urgency or frequency.  No flank pain, no hematuria   MS:  No joint pain or swelling.  No decreased range of motion.  No back pain.    Physical Exam  BP (!) 160/70 (BP Location: Left Arm, Patient Position: Sitting, Cuff Size: Normal)    Pulse 96    Temp 98.6 F (37 C) (Oral)    Ht 5\' 5"  (1.651 m)    Wt 88 lb 12.8 oz (40.3 kg)    SpO2 98%    BMI 14.78 kg/m   GEN: A/Ox3; pleasant , NAD, thin and frail, on O2    HEENT:  [place]/AT,  EACs-clear, TMs-wnl, NOSE-clear, THROAT-clear, no lesions, no postnasal drip or exudate noted.   NECK:  Supple w/ fair ROM; no JVD; normal carotid impulses w/o bruits; no  thyromegaly or nodules palpated; no lymphadenopathy.    RESP  Clear  P & A; w/o, wheezes/ rales/ or rhonchi. no accessory muscle use, no dullness to percussion  CARD:  RRR, no m/r/g, no peripheral edema, pulses intact, no cyanosis or clubbing.  GI:   Soft & nt; nml bowel sounds; no organomegaly or masses detected.   Musco: Warm bil, no deformities or joint swelling noted.   Neuro: alert, no focal deficits noted.    Skin: Warm, no lesions or rashes    Lab Results:  CBC       ProBNP No results found for: PROBNP  Imaging: No results found.    PFT Results Latest Ref Rng & Units 01/22/2015 11/05/2014  FVC-Pre L 1.58 1.52  FVC-Predicted Pre % 53 51  FVC-Post L 1.63 1.60  FVC-Predicted Post % 55 54  Pre FEV1/FVC % % 37 32  Post FEV1/FCV % % 36 35  FEV1-Pre L 0.58 0.49  FEV1-Predicted Pre % 26 22  FEV1-Post L 0.59 0.56  DLCO uncorrected ml/min/mmHg 6.92 6.85  DLCO UNC% % 27 27  DLVA Predicted % 47 42    No results found for: NITRICOXIDE      Assessment & Plan:   COPD GOLD IV  Severe COPD compensated on present regimen. Patient does run into issues towards the end of the year  with medication affordability.  We did discuss changing Spiriva over to ipratropium nebs if needed to help with cost. Check chest x-ray today.  Influenza and COVID vaccines are up-to-date  Plan  Patient Instructions  Continue on Spiriva daily  Continue on Brovana and Pulmicort Neb Twice daily   Continue on Singulair daily .  ProAir 2 puffs every 4hrs as needed wheezing .  Activity as tolerated High-protein diet, may try ensure between meals.  Continue on 2.5L/m oxygen . (Pulsing device 4l/m )  Chest xray today .  Follow up with Dr. Melvyn Novas  Or Dalila Arca NP in  1 year and As needed        Protein calorie malnutrition ([place]) High-protein diet.  Encouraged on Ensure or boost used between meals.  Chronic respiratory failure with hypoxia and hypercapnia (HCC) Continue on current oxygen to maintain O2 saturations greater than 88 to 90%     Rexene Edison, NP 02/10/2021

## 2021-02-10 NOTE — Patient Instructions (Signed)
Continue on Spiriva daily  Continue on Brovana and Pulmicort Neb Twice daily   Continue on Singulair daily .  ProAir 2 puffs every 4hrs as needed wheezing .  Activity as tolerated High-protein diet, may try ensure between meals.  Continue on 2.5L/m oxygen . (Pulsing device 4l/m )  Chest xray today .  Follow up with [name]  Or Janise Gora NP in  1 year and As needed

## 2021-02-17 ENCOUNTER — Other Ambulatory Visit: Payer: Self-pay | Admitting: *Deleted

## 2021-02-17 DIAGNOSIS — J988 Other specified respiratory disorders: Secondary | ICD-10-CM

## 2021-02-17 MED ORDER — SPIRIVA RESPIMAT 2.5 MCG/ACT IN AERS: 2.0000 | INHALATION_SPRAY | Freq: Every day | RESPIRATORY_TRACT | 11 refills | Status: DC

## 2021-02-17 NOTE — Progress Notes (Signed)
Called and spoke with patient, provided results/recommendations per [name] NP.  Order placed for the CXR.  Patient stated there was only one refill on the Spiriva sent to her pharmacy.  Advised I would sent a script to her pharmacy with enough refills until she follows up with [name] in 1 year.  She verbalized understanding.  Nothing further needed.

## 2021-02-17 NOTE — Addendum Note (Signed)
Addended by: Delrae Rend on: 02/17/2021 10:43 AM   Modules accepted: Orders

## 2021-03-05 ENCOUNTER — Ambulatory Visit (INDEPENDENT_AMBULATORY_CARE_PROVIDER_SITE_OTHER): Payer: Medicare Other

## 2021-03-05 DIAGNOSIS — J988 Other specified respiratory disorders: Secondary | ICD-10-CM | POA: Diagnosis not present

## 2021-03-05 DIAGNOSIS — J439 Emphysema, unspecified: Secondary | ICD-10-CM | POA: Diagnosis not present

## 2021-03-05 NOTE — Progress Notes (Signed)
Spoke with pt and voices understanding. Pt did not have any further questions. She will call back once she talks to her daughters before getting another scan.

## 2021-03-09 ENCOUNTER — Other Ambulatory Visit: Payer: Self-pay | Admitting: Internal Medicine

## 2021-03-09 ENCOUNTER — Telehealth: Payer: Self-pay | Admitting: Adult Health

## 2021-03-09 DIAGNOSIS — J449 Chronic obstructive pulmonary disease, unspecified: Secondary | ICD-10-CM | POA: Diagnosis not present

## 2021-03-09 NOTE — Telephone Encounter (Signed)
We discussed her chest x-ray results.  Discussed proceeding ?With a CT scan.  Patient wants to hold off on CT and wants to get a chest x-ray in 2 months ?Please make sure patient has a follow-up visit with [name] or Juleon Narang NP for a follow-up x-ray and office visit ?

## 2021-03-09 NOTE — Telephone Encounter (Signed)
Called patient and she has some questions regarding her chest xray. She is unsure about the wording. Can you please clarify? ? ?Pt also wants to know how you feel about telephone visits. I advised her I dont think we do telephone visit, but we do mychart video visits if the provider agrees to it.  ? ?Please advise tammy ?

## 2021-03-10 NOTE — Telephone Encounter (Signed)
ATC x1.  No answer.  LVM to return call.  When she calls back please schedule her for a f/u with either [name] or [name] NP in 2 months.  She will need to have a CXR prior to her appointment, so she will need to arrive about 30 minutes early. ?

## 2021-04-01 ENCOUNTER — Other Ambulatory Visit: Payer: Self-pay | Admitting: Family Medicine

## 2021-04-01 DIAGNOSIS — J449 Chronic obstructive pulmonary disease, unspecified: Secondary | ICD-10-CM | POA: Diagnosis not present

## 2021-04-01 DIAGNOSIS — I10 Essential (primary) hypertension: Secondary | ICD-10-CM

## 2021-04-09 DIAGNOSIS — J449 Chronic obstructive pulmonary disease, unspecified: Secondary | ICD-10-CM | POA: Diagnosis not present

## 2021-04-15 ENCOUNTER — Other Ambulatory Visit: Payer: Self-pay | Admitting: Family Medicine

## 2021-04-15 DIAGNOSIS — E782 Mixed hyperlipidemia: Secondary | ICD-10-CM

## 2021-04-28 ENCOUNTER — Telehealth: Payer: Self-pay | Admitting: Family Medicine

## 2021-04-28 NOTE — Telephone Encounter (Signed)
Pt called stating she used mucinex for a while and now believes it is no longer working. She is wondering if she can switch to Sudafed instead and if it will go well with her other meds.  ? ?Please advise.  ?

## 2021-04-30 NOTE — Telephone Encounter (Signed)
Pt calling back, wondering if she can switch to Sudafed 12 hour decongestant instead and if it will go well with her other meds.  ?  ?Please advise.  ?

## 2021-04-30 NOTE — Telephone Encounter (Signed)
The over the counter cough and cold medications with a decongestant in them may cause an increase in your blood pressure.  If you are having continued cough and cold symptoms it may be worth making an appt. ?

## 2021-05-01 NOTE — Telephone Encounter (Signed)
Spoke with pt, states she will not take it and she deals with it yearly. Stated she has appt with pulm on 5/08, will discuss with them if she is still having issues.  ?

## 2021-05-05 DIAGNOSIS — J449 Chronic obstructive pulmonary disease, unspecified: Secondary | ICD-10-CM | POA: Diagnosis not present

## 2021-05-09 DIAGNOSIS — J449 Chronic obstructive pulmonary disease, unspecified: Secondary | ICD-10-CM | POA: Diagnosis not present

## 2021-05-11 ENCOUNTER — Encounter: Payer: Self-pay | Admitting: Adult Health

## 2021-05-11 ENCOUNTER — Other Ambulatory Visit: Payer: Self-pay | Admitting: *Deleted

## 2021-05-11 ENCOUNTER — Ambulatory Visit: Payer: Medicare Other

## 2021-05-11 ENCOUNTER — Ambulatory Visit: Payer: Medicare Other | Admitting: Adult Health

## 2021-05-11 ENCOUNTER — Ambulatory Visit (INDEPENDENT_AMBULATORY_CARE_PROVIDER_SITE_OTHER): Payer: Medicare Other

## 2021-05-11 DIAGNOSIS — J9611 Chronic respiratory failure with hypoxia: Secondary | ICD-10-CM

## 2021-05-11 DIAGNOSIS — J9612 Chronic respiratory failure with hypercapnia: Secondary | ICD-10-CM | POA: Diagnosis not present

## 2021-05-11 DIAGNOSIS — R911 Solitary pulmonary nodule: Secondary | ICD-10-CM | POA: Diagnosis not present

## 2021-05-11 DIAGNOSIS — E43 Unspecified severe protein-calorie malnutrition: Secondary | ICD-10-CM

## 2021-05-11 DIAGNOSIS — J189 Pneumonia, unspecified organism: Secondary | ICD-10-CM

## 2021-05-11 DIAGNOSIS — R079 Chest pain, unspecified: Secondary | ICD-10-CM | POA: Diagnosis not present

## 2021-05-11 DIAGNOSIS — J449 Chronic obstructive pulmonary disease, unspecified: Secondary | ICD-10-CM | POA: Diagnosis not present

## 2021-05-11 DIAGNOSIS — J439 Emphysema, unspecified: Secondary | ICD-10-CM | POA: Diagnosis not present

## 2021-05-11 MED ORDER — AMOXICILLIN-POT CLAVULANATE 875-125 MG PO TABS: 1.0000 | ORAL_TABLET | Freq: Two times a day (BID) | ORAL | 0 refills | Status: AC

## 2021-05-11 NOTE — Progress Notes (Signed)
? ?@Patient  ID: Cathy Hensley, female    DOB: November 06, 1940, 81 y.o.   MRN: [id number] ? ?Chief Complaint  ?Patient presents with  ? Follow-up  ? ? ?Referring provider: ?Billie Ruddy, MD ? ?HPI: ?81 year old female former smoker followed for severe COPD with chronic hypoxic and hypercarbic respiratory failure ?Alpha-1 antitrypsin MZ (level 88) ? ?TEST/EVENTS :  ?01/22/15: FVC 1.58 L (53%) FEV1 0.58 L (26%) FEV1/FVC 0.37 FEF 25-75 0.23 L (13%) no bronchodilator response ?11/05/14: FVC 1.52 L (51%) FEV1 0.49 L (22%) FEV1/FVC 0.32 FEF 25-75 0.20 L (11%) no bronchodilator response ERV 87% DLCO uncorrected 27% ?  ?6MWT ?01/22/15:  Walked 192 meters / Baseline Sat 93% on RA / Nadir Sat 70% on RA (maintained saturation with 3 L/m pulse at 90%) ?  ?IMAGING ?Portable CXR 09/03/14: Hyperinflation with flattening of the diaphragms. No focal opacity or mass appreciated. No pleural effusion or thickening appreciated. Heart normal in size. Mediastinum normal in contour. ?  ?CARDIAC ?TTE (09/04/14): LV normal in size. Moderate LVH. EF 60-65%. No regional wall motion abnormalities. LA & RA normal in size. RV normal in size and function. Pulmonary artery systolic pressure 30 mmHg. No aortic stenosis or regurgitation. Normal aortic root. Mild mitral regurgitation. No pulmonic regurgitation. Trivial tricuspid regurgitation. Trivial pericardial effusion. ?  ?MICROBIOLOGY ?Sputum (09/06/14):  Oral flora ?Urine Strep Ag (09/06/14):  Negative ?Urine Legionella Ag (09/06/14):  Negative ?Influenza A/B PCR (09/06/14):  Negative  ?  ?LABS ?11/06/14 ?Alpha-1 antitrypsin: MZ (88) ?  ?09/06/14 ?ABG on 3 L/m:  7.40 / 65 / 63 / 91% ?CBC: 11.5/14.2/46.3/240 ?BMP: 140/4.4/99/41/19/0.38/118/9.2 ?LFT: 3.7/6.3/0.5/61/20/21 ?  ? ?05/11/2021 Follow up : COPD on oxygen dependent respiratory failure ?Patient presents for 53-month follow-up.  Patient has underlying severe COPD that is oxygen dependent.  She is on Pulmicort and Brovana nebulizer twice daily.  She takes  Spiriva daily.  She remains on oxygen 2.5 L .  She says overall she is doing about the same.  She gets winded with heavy activities.  She remains active at home.  Does some cleaning and house chores. Does get winded with heavy chores.  Weight has been stable currently at 88 pounds.  ?Complains over last 2 weeks of nasal congestion with thick green nasal discharge. Has increased cough/congestion. No fever. No hemoptysis . No n/v/d  ?Last visit chest x-ray showed stable 11 mm nodular density at right lung base.  We recommended a CT chest.  However patient declined CT chest last office visit. Says she wants to be more conservative if possible.   Patient education was given ? ? ? ?No Known Allergies ? ?Immunization History  ?Administered Date(s) Administered  ? Fluad Quad(high Dose 65+) 09/21/2018, 09/21/2019, 10/08/2020  ? Influenza Split 10/05/2010, 11/15/2012  ? Influenza, High Dose Seasonal PF 09/21/2013, 10/01/2014, 09/06/2017  ? Influenza,inj,Quad PF,6+ Mos 08/27/2016  ? Influenza-Unspecified 08/24/2015  ? PFIZER(Purple Top)SARS-COV-2 Vaccination 03/03/2019, 03/20/2019, 10/04/2019, 10/23/2020  ? Pneumococcal Conjugate-13 01/19/2013  ? Pneumococcal Polysaccharide-23 01/04/2005, 03/19/2010  ? Td 09/05/2003  ? Tdap 06/29/2010, 02/07/2019  ? ? ?Past Medical History:  ?Diagnosis Date  ? Alpha-1-antitrypsin deficiency carrier   ? COPD 07/13/2006  ? HYPERTENSION 07/13/2006  ? MENOPAUSAL SYNDROME 07/13/2006  ? Other and unspecified hyperlipidemia 07/13/2006  ? Respiratory failure ([place])   ? Chronic hypoxic  ? ? ?Tobacco History: ?Social History  ? ?Tobacco Use  ?Smoking Status Former  ? Packs/day: 0.50  ? Years: 42.00  ? Pack years: 21.00  ? Types: Cigarettes  ?  Quit date: 09/23/2000  ? Years since quitting: 20.6  ?Smokeless Tobacco Never  ? ?Counseling given: Not Answered ? ? ?Outpatient Medications Prior to Visit  ?Medication Sig Dispense Refill  ? acetaminophen (TYLENOL) 500 MG tablet Take 500 mg by mouth every 6 (six) hours as  needed.    ? albuterol (VENTOLIN HFA) 108 (90 Base) MCG/ACT inhaler INHALE 2 PUFFS INTO THE LUNGS EVERY 6 HOURS AS NEEDED FOR WHEEZING OR SHORTNESS OF BREATH 8.5 g 3  ? amLODipine (NORVASC) 5 MG tablet TAKE 1 TABLET(5 MG) BY MOUTH DAILY 90 tablet 1  ? arformoterol (BROVANA) 15 MCG/2ML NEBU USE 1 VIAL  IN  NEBULIZER TWICE  DAILY - morning and evening 60 mL 11  ? aspirin 81 MG chewable tablet Chew 1 tablet (81 mg total) by mouth daily.    ? budesonide (PULMICORT) 0.25 MG/2ML nebulizer solution Use 1 vial in nebulizer twice daily.  Once mouth after treatment. 60 mL 11  ? dextromethorphan-guaiFENesin (MUCINEX DM) 30-600 MG 12hr tablet Take 1 tablet by mouth 2 (two) times daily.    ? montelukast (SINGULAIR) 10 MG tablet TAKE 1 TABLET(10 MG) BY MOUTH AT BEDTIME 90 tablet 1  ? Multiple Vitamins-Minerals (CENTRUM SILVER PO) Take 1 tablet by mouth daily.    ? OXYGEN 2.5 lpm 24/7 (she uses 4lpm pulsed when on POC)    ? Respiratory Therapy Supplies (FLUTTER) DEVI 1 Device by Does not apply route as needed. 1 each 0  ? simvastatin (ZOCOR) 20 MG tablet TAKE 1 TABLET(20 MG) BY MOUTH AT BEDTIME 90 tablet 1  ? Tiotropium Bromide Monohydrate (SPIRIVA RESPIMAT) 2.5 MCG/ACT AERS Inhale 2 puffs into the lungs daily. 4 g 11  ? Tiotropium Bromide Monohydrate (SPIRIVA RESPIMAT) 2.5 MCG/ACT AERS Inhale 2 puffs into the lungs daily. 2 each 0  ? Tiotropium Bromide Monohydrate (SPIRIVA RESPIMAT) 2.5 MCG/ACT AERS Inhale 2 puffs into the lungs daily. 4 g 11  ? Tiotropium Bromide Monohydrate (SPIRIVA RESPIMAT) 2.5 MCG/ACT AERS INHALE 2 PUFFS INTO THE LUNGS DAILY 4 g 11  ? ?No facility-administered medications prior to visit.  ? ? ? ?Review of Systems:  ? ?Constitutional:   No  weight loss, night sweats,  Fevers, chills,  ?+fatigue, or  lassitude. ? ?HEENT:   No headaches,  Difficulty swallowing,  Tooth/dental problems, or  Sore throat,  ?              No sneezing, itching, ear ache, nasal congestion, post nasal drip,  ? ?CV:  No chest pain,   Orthopnea, PND, swelling in lower extremities, anasarca, dizziness, palpitations, syncope.  ? ?GI  No heartburn, indigestion, abdominal pain, nausea, vomiting, diarrhea, change in bowel habits, loss of appetite, bloody stools.  ? ?Resp:   No chest wall deformity ? ?Skin: no rash or lesions. ? ?GU: no dysuria, change in color of urine, no urgency or frequency.  No flank pain, no hematuria  ? ?MS:  No joint pain or swelling.  No decreased range of motion.  No back pain. ? ? ? ?Physical Exam ? ?BP 120/60 (BP Location: Left Arm, Patient Position: Sitting, Cuff Size: Normal)   Pulse 95   Temp 98 ?F (36.7 ?C) (Oral)   Ht 5\' 5"  (1.651 m)   Wt 88 lb 6.4 oz (40.1 kg)   SpO2 94%   BMI 14.71 kg/m?  ? ?GEN: A/Ox3; pleasant , NAD, thin and frail on O2  ?  ?HEENT:  [place]/AT, , NOSE-clear discharge , THROAT-clear, no lesions, no postnasal drip  or exudate noted.  ? ?NECK:  Supple w/ fair ROM; no JVD; normal carotid impulses w/o bruits; no thyromegaly or nodules palpated; no lymphadenopathy.   ? ?RESP  Few trace rhonchi , no accessory muscle use, no dullness to percussion ? ?CARD:  RRR, no m/r/g, no peripheral edema, pulses intact, no cyanosis or clubbing. ? ?GI:   Soft & nt; nml bowel sounds; no organomegaly or masses detected.  ? ?Musco: Warm bil, no deformities or joint swelling noted.  ? ?Neuro: alert, no focal deficits noted.   ? ?Skin: Warm, no lesions or rashes ? ? ? ?Lab Results: ? ? ? ? ? ?ProBNP ?No results found for: PROBNP ? ?Imaging: ?No results found. ? ? ? ? ?  Latest Ref Rng & Units 01/22/2015  ?  3:23 PM 11/05/2014  ?  3:43 PM  ?PFT Results  ?FVC-Pre L 1.58   1.52    ?FVC-Predicted Pre % 53   51    ?FVC-Post L 1.63   1.60    ?FVC-Predicted Post % 55   54    ?Pre FEV1/FVC % % 37   32    ?Post FEV1/FCV % % 36   35    ?FEV1-Pre L 0.58   0.49    ?FEV1-Predicted Pre % 26   22    ?FEV1-Post L 0.59   0.56    ?DLCO uncorrected ml/min/mmHg 6.92   6.85    ?DLCO UNC% % 27   27    ?DLVA Predicted % 47   42    ? ? ?No results  found for: NITRICOXIDE ? ? ? ? ? ?Assessment & Plan:  ? ?COPD GOLD IV  ?Mild flare with early sinusitis/bronchitis  ? ?Plan  ?Patient Instructions  ?Augmentin 875mg  Twice daily  for 7 days , take with food.  ?Jeralene Huff

## 2021-05-11 NOTE — Assessment & Plan Note (Signed)
Continue on O2 to keep sats >88-90% 

## 2021-05-11 NOTE — Assessment & Plan Note (Signed)
Abnormal chest xray with Right basilar nodule 11 mm . Repeat Chest xray , if remains present consider CT chest  ?Discussed with patient in detail  ?

## 2021-05-11 NOTE — Assessment & Plan Note (Signed)
High-protein diet 

## 2021-05-11 NOTE — Patient Instructions (Addendum)
Augmentin 875mg  Twice daily  for 7 days , take with food.  ?Saline nasal rinses Twice daily   ?Saline nasal gel At bedtime   ?Continue on Spiriva daily  ?Continue on Brovana and Pulmicort Neb Twice daily   ?Continue on Singulair daily .  ?ProAir 2 puffs every 4hrs as needed wheezing .  ?Activity as tolerated ?High-protein diet, may try ensure between meals.  ?Continue on 2.5L/m oxygen . (Pulsing device 4l/m )  ?Chest xray today .  ?Follow up with Dr.  Or Jacoby Zanni NP in 4 months  and As needed   ?

## 2021-05-11 NOTE — Assessment & Plan Note (Signed)
Mild flare with early sinusitis/bronchitis  ? ?Plan  ?Patient Instructions  ?Augmentin 875mg  Twice daily  for 7 days , take with food.  ?Saline nasal rinses Twice daily   ?Saline nasal gel At bedtime   ?Continue on Spiriva daily  ?Continue on Brovana and Pulmicort Neb Twice daily   ?Continue on Singulair daily .  ?ProAir 2 puffs every 4hrs as needed wheezing .  ?Activity as tolerated ?High-protein diet, may try ensure between meals.  ?Continue on 2.5L/m oxygen . (Pulsing device 4l/m )  ?Chest xray today .  ?Follow up with Dr.  Or Kentley Blyden NP in 4 months  and As needed   ?  ? ?

## 2021-05-12 ENCOUNTER — Other Ambulatory Visit: Payer: Self-pay | Admitting: *Deleted

## 2021-05-12 ENCOUNTER — Telehealth: Payer: Self-pay | Admitting: Adult Health

## 2021-05-12 DIAGNOSIS — R911 Solitary pulmonary nodule: Secondary | ICD-10-CM

## 2021-05-12 NOTE — Progress Notes (Signed)
Called and spoke with patient, provided results/recommendations per Rexene Edison NP.  She verbalized understanding.  She requested to have CT scan in early June.  CT with contrast ordered and BMP ordered as well within 24 hours of CT scan.  Nothing further needed.

## 2021-05-12 NOTE — Telephone Encounter (Signed)
Call returned to patient.  See result note. ?

## 2021-05-13 ENCOUNTER — Telehealth: Payer: Self-pay | Admitting: Adult Health

## 2021-05-13 NOTE — Telephone Encounter (Signed)
I spoke with the pt and she asked about scheduled her CT  ?The order was placed  ?I advised that the PCC's would call her to schedule this  ? ?[name]- in the ct schedule notes it says to wait until early June to schedule the CT. Is that correct? I do not see in your result notes. Let me know if she needs this to be done sooner, thanks! ?

## 2021-05-14 NOTE — Telephone Encounter (Signed)
Yes that is fine

## 2021-05-14 NOTE — Telephone Encounter (Signed)
Noted. Nothing further needed at this time.  

## 2021-05-19 ENCOUNTER — Telehealth: Payer: Self-pay | Admitting: Adult Health

## 2021-06-02 ENCOUNTER — Telehealth: Payer: Self-pay | Admitting: Adult Health

## 2021-06-03 ENCOUNTER — Other Ambulatory Visit (INDEPENDENT_AMBULATORY_CARE_PROVIDER_SITE_OTHER): Payer: Medicare Other

## 2021-06-03 DIAGNOSIS — R911 Solitary pulmonary nodule: Secondary | ICD-10-CM

## 2021-06-03 LAB — BASIC METABOLIC PANEL
BUN: 17 mg/dL (ref 6–23)
CO2: 40 mEq/L — ABNORMAL HIGH (ref 19–32)
Calcium: 10 mg/dL (ref 8.4–10.5)
Chloride: 94 mEq/L — ABNORMAL LOW (ref 96–112)
Creatinine, Ser: 0.48 mg/dL (ref 0.40–1.20)
GFR: 89.63 mL/min (ref >60.00)
Glucose, Bld: 87 mg/dL (ref 70–99)
Potassium: 3.8 mEq/L (ref 3.5–5.1)
Sodium: 140 mEq/L (ref 135–145)

## 2021-06-04 DIAGNOSIS — J449 Chronic obstructive pulmonary disease, unspecified: Secondary | ICD-10-CM | POA: Diagnosis not present

## 2021-06-09 DIAGNOSIS — J449 Chronic obstructive pulmonary disease, unspecified: Secondary | ICD-10-CM | POA: Diagnosis not present

## 2021-06-10 ENCOUNTER — Ambulatory Visit (INDEPENDENT_AMBULATORY_CARE_PROVIDER_SITE_OTHER)
Admission: RE | Admit: 2021-06-10 | Discharge: 2021-06-10 | Disposition: A | Discharge status: Home / Self Care | Payer: Medicare Other | Source: Ambulatory Visit | Attending: Adult Health | Admitting: Adult Health

## 2021-06-10 DIAGNOSIS — R911 Solitary pulmonary nodule: Secondary | ICD-10-CM

## 2021-06-10 DIAGNOSIS — J439 Emphysema, unspecified: Secondary | ICD-10-CM | POA: Diagnosis not present

## 2021-06-10 MED ORDER — IOHEXOL 300 MG/ML  SOLN
65.0000 mL | Freq: Once | INTRAMUSCULAR | Status: AC | PRN
  Administered 2021-06-10: 65 mL via INTRAVENOUS

## 2021-06-11 ENCOUNTER — Other Ambulatory Visit: Payer: Self-pay

## 2021-06-11 DIAGNOSIS — R911 Solitary pulmonary nodule: Secondary | ICD-10-CM

## 2021-06-19 ENCOUNTER — Ambulatory Visit (HOSPITAL_COMMUNITY): Payer: Medicare Other

## 2021-06-29 ENCOUNTER — Encounter (HOSPITAL_COMMUNITY)
Admission: RE | Admit: 2021-06-29 | Discharge: 2021-06-29 | Disposition: A | Discharge status: Home / Self Care | Payer: Medicare Other | Source: Ambulatory Visit | Attending: Adult Health | Admitting: Adult Health

## 2021-06-29 ENCOUNTER — Ambulatory Visit: Payer: Medicare Other | Admitting: Internal Medicine

## 2021-06-29 DIAGNOSIS — R911 Solitary pulmonary nodule: Secondary | ICD-10-CM | POA: Insufficient documentation

## 2021-06-29 LAB — GLUCOSE, CAPILLARY: Glucose-Capillary: 98 mg/dL (ref 70–99)

## 2021-07-01 ENCOUNTER — Telehealth: Payer: Self-pay | Admitting: Adult Health

## 2021-07-01 DIAGNOSIS — J449 Chronic obstructive pulmonary disease, unspecified: Secondary | ICD-10-CM | POA: Diagnosis not present

## 2021-07-01 NOTE — Telephone Encounter (Signed)
Called and spoke with patient, she was requesting to speak to [name] regarding the results of her PET scan.  I advised her that Babette Relic is out of the office until Monday and that [name] will be back in the office on Thursday.  She has a f/u appointment with [name] on Friday and her daughter is coming with her.  She said she would just wait until Friday and not to bother [name].  I let her know we would see her on Friday.  She verbalized understanding.  Nothing further needed.

## 2021-07-01 NOTE — Telephone Encounter (Signed)
Seems like encounter was open in error so closing encounter.  

## 2021-07-02 NOTE — Progress Notes (Signed)
Called and spoke with patient, provided results per [name] NP.  She will keep her appointment with [name] tomorrow.  Advised he will review the PET scan in detail with her and her daughter and answer any questions they have.  She verified understanding.  Nothing further needed.

## 2021-07-03 ENCOUNTER — Encounter: Payer: Self-pay | Admitting: Internal Medicine

## 2021-07-03 ENCOUNTER — Ambulatory Visit: Payer: Medicare Other | Admitting: Internal Medicine

## 2021-07-03 DIAGNOSIS — J9612 Chronic respiratory failure with hypercapnia: Secondary | ICD-10-CM

## 2021-07-03 DIAGNOSIS — J449 Chronic obstructive pulmonary disease, unspecified: Secondary | ICD-10-CM | POA: Diagnosis not present

## 2021-07-03 DIAGNOSIS — J9611 Chronic respiratory failure with hypoxia: Secondary | ICD-10-CM | POA: Diagnosis not present

## 2021-07-03 MED ORDER — SPIRIVA RESPIMAT 2.5 MCG/ACT IN AERS: 2.0000 | INHALATION_SPRAY | Freq: Every day | RESPIRATORY_TRACT | 0 refills | Status: DC

## 2021-07-03 NOTE — Progress Notes (Unsigned)
\\  Subjective:   Patient ID: Cathy Hensley, female    DOB: 1940/11/01  MRN: [id number]     Brief patient profile:  27 yowf  MZ quit smoking  2002  severe COPD: Prescribed Pulmicort, Brovana, & Spiriva.  Chronic hypoxic respiratory failure: on  2.5 lpm home/ 4 pulsed with ambulation  Chronic hypercarbic respiratory failure: Not currently on noninvasive positive pressure ventilation. Secondary to COPD. Chronic allergic rhinitis: Prescribed Singulair.      12/06/2016 acute extended ov/Cathy Hensley re:  GOLD IV/ 02 dep on brovana/bud/ spiriva  Chief Complaint  Patient presents with   Acute Visit    Increased SOB and cough for the past 3 days. She states sputum is hard to produce, but when she does it's grey and green.   on 2.5 lpm home and 4lpm pulsed with baseline = MMRC3 = can't walk 100 yards even at a slow pace at a flat grade s stopping due to sob   Slept ok in recliner night before ov but usually able to lie flat in bed s am HA  Acutely worse x 72 with congested cough thick and slt grey/ green worse in am  Comfortable at rest/ no needing extra saba  rec zpak  Prednisone 10 mg take  4 each am x 2 days,   2 each am x 2 days,  1 each am x 2 days and stop  - add:  F/u with ov in one week - if worse in meantime rx levaquin 750 daily x 5 days     12/15/2016  f/u ov/Cathy Hensley re: copd / s/p pna  Chief Complaint  Patient presents with   Follow-up    Cxr done today to follow up on pna.  Pt states that she is some better since last visit but not back to her usual self. Does have an occ. cough which is sometimes productive, SOB with exertion. Denies any CP. DME: [facility], 4L pulse when out, 2L constant at home.   mucus is grey to green/ no fever, no cp  Doe still = MMRC3  Able to lie flat hs now   mucus is most discolored in am/ very hard to cough up / scant amts  rec Hold the levaquin to see if you don't improve just mucinex up to 1200 mg every 12 hours and flutter valve Return in Aug 2019 - call  sooner if needed     09/06/2017  f/u ov/Cathy Hensley re: GOLD IV  copd/ 02 dep resp failure/ hypercarbic/ poor hfa / smi  Chief Complaint  Patient presents with   Follow-up    Increased SOB over the past few wks "allergies- it happens every year".  She has occ cough with green sputum.  She rarely uses her albuterol inhaler.   Dyspnea:  MMRC2 = can't walk a nl pace on a flat grade s sob but does fine slow and flat eg [employer] walking  Cough: is worse x sev weeks  Sleeping: 2.5 lpm x 2 pillows SABA use: rare 02: 2.5 at home, 4lpm portable pulsed  rec If allergies/ cough / breathing get worse >  Prednisone 10 mg take  4 each am x 2 days,   2 each am x 2 days,  1 each am x 2 days and stop  Flu shot today    01/09/2018   Acute extended ov/Cathy Hensley re: acute flare Jan 04 2018 started with head cold  Chief Complaint  Patient presents with   Acute Visit  Pt c/o increased cough x 5 days- prod with green to brown sputum. She has also had body aches and HA.   now sleeping  in recliner position due to cough > sob  No rigors /sweats but sputum turning purulent assoc with nasal congestion Using flutter " occasionally   Did not start pred yet   no change 02 rx 2lpm home/ titrate when on portable  Rec Augmentin 875 mg take one pill twice daily  X 10 days  If allergies/ breathing/ wheezing increase need for albuterol,  breathing get worse >  Prednisone 10 mg take  4 each am x 2 days,   2 each am x 2 days,  1 each am x 2 days and sto For cough > mucinex or mucinex dm  Max of 1200 mg every 12  hours with a glass of water and use flutter as much as possible  NP recs  05/11/21  Augmentin 875mg  Twice daily  for 7 days , take with food.  Saline nasal rinses Twice daily   Saline nasal gel At bedtime   Continue on Spiriva daily  Continue on Brovana and Pulmicort Neb Twice daily   Continue on Singulair daily .  ProAir 2 puffs every 4hrs as needed wheezing .  Activity as tolerated High-protein diet, may try ensure  between meals.  Continue on 2.5L/m oxygen . (Pulsing device 4l/m )  Chest xray today .    07/03/2021  f/u ov/Cathy Hensley re: ***   maint on ***  Chief Complaint  Patient presents with   Follow-up    Pt states that her breathing is worse and she needs results for a PET scan.  Dyspnea:  leaning on cart with 02  2.5 lpm cont at HT sats in 90s  Cough: none even in am  Sleeping: on flat bed  2 pillows on side  SABA use: none  02: 2.5 24/7 Covid status:   vax x 4    No obvious day to day or daytime variability or assoc excess/ purulent sputum or mucus plugs or hemoptysis or cp or chest tightness, subjective wheeze or overt sinus or hb symptoms.   *** without nocturnal  or early am exacerbation  of respiratory  c/o's or need for noct saba. Also denies any obvious fluctuation of symptoms with weather or environmental changes or other aggravating or alleviating factors except as outlined above   No unusual exposure hx or h/o childhood pna/ asthma or knowledge of premature birth.  Current Allergies, Complete Past Medical History, Past Surgical History, Family History, and Social History were reviewed in 07/05/2021 record.  ROS  The following are not active complaints unless bolded Hoarseness, sore throat, dysphagia, dental problems, itching, sneezing,  nasal congestion or discharge of excess mucus or purulent secretions, ear ache,   fever, chills, sweats, unintended wt loss or wt gain, classically pleuritic or exertional cp,  orthopnea pnd or arm/hand swelling  or leg swelling, presyncope, palpitations, abdominal pain, anorexia, nausea, vomiting, diarrhea  or change in bowel habits or change in bladder habits, change in stools or change in urine, dysuria, hematuria,  rash, arthralgias, visual complaints, headache, numbness, weakness or ataxia or problems with walking or coordination,  change in mood or  memory.        Current Meds  Medication Sig   acetaminophen (TYLENOL) 500 MG  tablet Take 500 mg by mouth every 6 (six) hours as needed.   albuterol (VENTOLIN HFA) 108 (90 Base) MCG/ACT inhaler INHALE  2 PUFFS INTO THE LUNGS EVERY 6 HOURS AS NEEDED FOR WHEEZING OR SHORTNESS OF BREATH   amLODipine (NORVASC) 5 MG tablet TAKE 1 TABLET(5 MG) BY MOUTH DAILY   arformoterol (BROVANA) 15 MCG/2ML NEBU USE 1 VIAL  IN  NEBULIZER TWICE  DAILY - morning and evening   aspirin 81 MG chewable tablet Chew 1 tablet (81 mg total) by mouth daily.   budesonide (PULMICORT) 0.25 MG/2ML nebulizer solution Use 1 vial in nebulizer twice daily.  Once mouth after treatment.   dextromethorphan-guaiFENesin (MUCINEX DM) 30-600 MG 12hr tablet Take 1 tablet by mouth 2 (two) times daily.   montelukast (SINGULAIR) 10 MG tablet TAKE 1 TABLET(10 MG) BY MOUTH AT BEDTIME   Multiple Vitamins-Minerals (CENTRUM SILVER PO) Take 1 tablet by mouth daily.   OXYGEN 2.5 lpm 24/7 (she uses 4lpm pulsed when on POC)   Respiratory Therapy Supplies (FLUTTER) DEVI 1 Device by Does not apply route as needed.   simvastatin (ZOCOR) 20 MG tablet TAKE 1 TABLET(20 MG) BY MOUTH AT BEDTIME   Tiotropium Bromide Monohydrate (SPIRIVA RESPIMAT) 2.5 MCG/ACT AERS Inhale 2 puffs into the lungs daily.   Tiotropium Bromide Monohydrate (SPIRIVA RESPIMAT) 2.5 MCG/ACT AERS Inhale 2 puffs into the lungs daily.   Tiotropium Bromide Monohydrate (SPIRIVA RESPIMAT) 2.5 MCG/ACT AERS Inhale 2 puffs into the lungs daily.   Tiotropium Bromide Monohydrate (SPIRIVA RESPIMAT) 2.5 MCG/ACT AERS INHALE 2 PUFFS INTO THE LUNGS DAILY             Objective:   Physical Exam   wts  07/03/2021        87  01/09/2018          106  09/06/2017         109   12/15/2016    108   12/06/16 112 lb (50.8 kg)  09/28/16 113 lb (51.3 kg)  08/27/16 112 lb (50.8 kg)    Vital signs reviewed  07/03/2021  - Note at rest 02 sats  98% on RA   General appearance:    w/c bound somewhat frail elderly wf nad/ no rattling         Mild barrel  mild clubbin***            PFT 01/22/15: FVC 1.58 L (53%) FEV1 0.58 L (26%) FEV1/FVC 0.37 FEF 25-75 0.23 L (13%) no bronchodilator response 11/05/14: FVC 1.52 L (51%) FEV1 0.49 L (22%) FEV1/FVC 0.32 FEF 25-75 0.20 L (11%) no bronchodilator response ERV 87% DLCO uncorrected 27%                      Assessment & Plan:

## 2021-07-03 NOTE — Patient Instructions (Addendum)
Work on inhaler technique:  relax and gently blow all the way out then take a nice smooth full deep breath back in, triggering the inhaler at same time you start breathing in.  Hold for up to 5 seconds if you can.  Rinse and gargle with water when done.  If mouth or throat bother you at all,  try brushing teeth/gums/tongue with arm and hammer toothpaste/ make a slurry and gargle and spit out.   Ok to try albuterol 15 min before an activity (on alternating days - like starter fluid)  that you know would usually make you short of breath and see if it makes any difference and if makes none then don't take albuterol after activity unless you can't catch your breath as this means it's the resting that helps, not the albuterol.      Make sure you check your oxygen saturation  AT  your highest level of activity (not after you stop)   to be sure it stays over 90% and adjust  02 flow upward to maintain this level if needed but remember to turn it back to previous settings when you stop (to conserve your supply).    Please schedule a follow up visit in 3 months but call sooner if needed

## 2021-07-04 ENCOUNTER — Encounter: Payer: Self-pay | Admitting: Internal Medicine

## 2021-07-04 NOTE — Assessment & Plan Note (Signed)
HCO3  09/28/16 = 35   As of 07/03/2021   2.5 lpm 24/7 but ok to titrate up if needed with exertion to maintain sats low 90s/ advised  Each maintenance medication was reviewed in detail including emphasizing most importantly the difference between maintenance and prns and under what circumstances the prns are to be triggered using an action plan format where appropriate.  Total time for H and P, chart review, counseling, reviewing hfa/neb/02 device(s) and generating customized AVS unique to this office visit / same day charting = 25 min

## 2021-07-04 NOTE — Assessment & Plan Note (Signed)
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) PFT 01/22/15: FVC 1.58 L (53%) FEV1 0.58 L (26%) FEV1/FVC 0.37 FEF 25-75 0.23 L (13%) no bronchodilator response 11/05/14:FVC 1.52 L (51%) FEV1 0.49 L (22%) FEV1/FVC 0.32 FEF 25-75 0.20 L (11%) no bronchodilator response ERV 87% DLCO uncorrected 27% - trained on flutter use 12/15/2016  - 07/03/2021  After extensive coaching inhaler device,  effectiveness =    75% from baseline 25% with SMI > continue spiriva plus neb laba / ics    Group D (now reclassified as E) in terms of symptom/risk and laba/lama/ICS  therefore appropriate rx at this point >>>  Continue present  rx plus approp saba  Re SABA :  I spent extra time with pt today reviewing appropriate use of albuterol for prn use on exertion with the following points: 1) saba is for relief of sob that does not improve by walking a slower pace or resting but rather if the pt does not improve after trying this first. 2) If the pt is convinced, as many are, that saba helps recover from activity faster then it's easy to tell if this is the case by re-challenging : ie stop, take the inhaler, then p 5 minutes try the exact same activity (intensity of workload) that just caused the symptoms and see if they are substantially diminished or not after saba 3) if there is an activity that reproducibly causes the symptoms, try the saba 15 min before the activity on alternate days   If in fact the saba really does help, then fine to continue to use it prn but advised may need to look closer at the maintenance regimen being used to achieve better control of airways disease with exertion.

## 2021-07-09 DIAGNOSIS — J449 Chronic obstructive pulmonary disease, unspecified: Secondary | ICD-10-CM | POA: Diagnosis not present

## 2021-08-03 ENCOUNTER — Telehealth: Payer: Self-pay | Admitting: Family Medicine

## 2021-08-03 DIAGNOSIS — J449 Chronic obstructive pulmonary disease, unspecified: Secondary | ICD-10-CM | POA: Diagnosis not present

## 2021-08-03 NOTE — Telephone Encounter (Signed)
Pt is calling and needs a new rx for nebulizer machine from [facility] phone number is [phone]

## 2021-08-07 ENCOUNTER — Telehealth: Payer: Self-pay | Admitting: Internal Medicine

## 2021-08-07 DIAGNOSIS — J9611 Chronic respiratory failure with hypoxia: Secondary | ICD-10-CM

## 2021-08-07 NOTE — Telephone Encounter (Signed)
Called and spoke to patient and she stated that she needs a new nebulizer machine and supplies. Patient did confirm that she uses [facility]. Nothing further needed

## 2021-08-09 DIAGNOSIS — J449 Chronic obstructive pulmonary disease, unspecified: Secondary | ICD-10-CM | POA: Diagnosis not present

## 2021-08-11 NOTE — Telephone Encounter (Signed)
Paperwork faxed to [facility] requesting new nebulizer. [place] faxed it on 08/07/21.

## 2021-08-12 DIAGNOSIS — J449 Chronic obstructive pulmonary disease, unspecified: Secondary | ICD-10-CM | POA: Diagnosis not present

## 2021-09-02 DIAGNOSIS — J449 Chronic obstructive pulmonary disease, unspecified: Secondary | ICD-10-CM | POA: Diagnosis not present

## 2021-09-09 DIAGNOSIS — J449 Chronic obstructive pulmonary disease, unspecified: Secondary | ICD-10-CM | POA: Diagnosis not present

## 2021-09-12 DIAGNOSIS — J449 Chronic obstructive pulmonary disease, unspecified: Secondary | ICD-10-CM | POA: Diagnosis not present

## 2021-09-14 ENCOUNTER — Ambulatory Visit: Payer: Medicare Other | Admitting: Adult Health

## 2021-09-22 ENCOUNTER — Telehealth: Payer: Self-pay | Admitting: Internal Medicine

## 2021-09-22 NOTE — Telephone Encounter (Signed)
Patient called to ask the nurse or doctor if it is ok for her to get the flu, rsv and new covid vaccine at the same time or if she can just get all three.  Please advise and call patient to confirm today.  CB# [phone]

## 2021-09-22 NOTE — Telephone Encounter (Signed)
Left message for patient to call back  

## 2021-09-28 ENCOUNTER — Other Ambulatory Visit: Payer: Self-pay | Admitting: Family Medicine

## 2021-09-28 DIAGNOSIS — I10 Essential (primary) hypertension: Secondary | ICD-10-CM

## 2021-09-28 DIAGNOSIS — J449 Chronic obstructive pulmonary disease, unspecified: Secondary | ICD-10-CM

## 2021-09-29 ENCOUNTER — Telehealth: Payer: Self-pay | Admitting: Family Medicine

## 2021-09-29 ENCOUNTER — Encounter: Payer: Self-pay | Admitting: Family Medicine

## 2021-09-29 ENCOUNTER — Ambulatory Visit (INDEPENDENT_AMBULATORY_CARE_PROVIDER_SITE_OTHER): Payer: Medicare Other | Admitting: Family Medicine

## 2021-09-29 VITALS — BP 144/72 | HR 94 | Temp 99.2°F | Wt 84.0 lb

## 2021-09-29 DIAGNOSIS — I10 Essential (primary) hypertension: Secondary | ICD-10-CM

## 2021-09-29 DIAGNOSIS — L729 Follicular cyst of the skin and subcutaneous tissue, unspecified: Secondary | ICD-10-CM | POA: Diagnosis not present

## 2021-09-29 DIAGNOSIS — L089 Local infection of the skin and subcutaneous tissue, unspecified: Secondary | ICD-10-CM

## 2021-09-29 DIAGNOSIS — J449 Chronic obstructive pulmonary disease, unspecified: Secondary | ICD-10-CM | POA: Diagnosis not present

## 2021-09-29 DIAGNOSIS — Z23 Encounter for immunization: Secondary | ICD-10-CM

## 2021-09-29 MED ORDER — AMOXICILLIN 500 MG PO TABS: 500.0000 mg | ORAL_TABLET | Freq: Two times a day (BID) | ORAL | 0 refills | Status: AC

## 2021-09-29 NOTE — Telephone Encounter (Signed)
Pt was in today and state provider suggested 2 laxatives/stool softeners to assist with her constipation, she forgot which ones were suggested and requesting a call with that info

## 2021-09-29 NOTE — Telephone Encounter (Signed)
OTC MiraLAX or Senokot.

## 2021-09-29 NOTE — Progress Notes (Signed)
Subjective:    Patient ID: Cathy Hensley, female    DOB: 06-28-1940, 81 y.o.   MRN: [id number]  Chief Complaint  Patient presents with   Constipation    Pt has taken stool softners; chronic issue  Pt accompanied by her husband, Marcello Moores  HPI Patient was seen today for ongoing concern.  Patient endorses painful bump in perineal area x1 week.  Tried soaking in bathtub with baking soda.  States was advised by triage person overnight.  Pt was concerned she had a hernia.    Pt notes recent constipation.  Tried a stool softener, apple juice, prune juice, eating salads, and increasing fiber intake.  Pt drinking boost to help with weight.    Past Medical History:  Diagnosis Date   Alpha-1-antitrypsin deficiency carrier    COPD 07/13/2006   HYPERTENSION 07/13/2006   MENOPAUSAL SYNDROME 07/13/2006   Other and unspecified hyperlipidemia 07/13/2006   Respiratory failure (HCC)    Chronic hypoxic    No Known Allergies  ROS General: Denies fever, chills, night sweats, changes in weight, changes in appetite HEENT: Denies headaches, ear pain, changes in vision, rhinorrhea, sore throat CV: Denies CP, palpitations, SOB, orthopnea Pulm: Denies SOB, cough, wheezing GI: Denies abdominal pain, nausea, vomiting, diarrhea, constipation GU: Denies dysuria, hematuria, frequency, vaginal discharge  Msk: Denies muscle cramps, joint pains Neuro: Denies weakness, numbness, tingling Skin: Denies rashes, bruising +bump in perineal area Psych: Denies depression, anxiety, hallucinations     Objective:    Blood pressure (!) 144/72, pulse 94, temperature 99.2 F (37.3 C), temperature source Oral, weight 84 lb (38.1 kg), SpO2 95 %. Body mass index is 13.56 kg/m.  Gen. Pleasant, underweight, thin, frail, in no distress, normal affect, on O2 4 L pulsed via [place]. Switched from portable compressor to clinic O2 while in office. HEENT: [place]/AT, face symmetric, conjunctiva clear, no scleral icterus, PERRLA, EOMI, nares patent  without drainage Lungs: no accessory muscle use Cardiovascular: RRR, no m/r/g, no peripheral edema Musculoskeletal: extremely thin, No deformities, no cyanosis or clubbing, normal tone GU: normal external female genitalia, atrophic, normal perineum.  Fluctuant, erythematous mass of R medial buttock lateral to R labia majora with induration surrounding.  No drainage.  TTP. Neuro:  A&Ox3, CN II-XII intact, normal gait Skin:  Warm,dry, intact.  Fluctuant erythematous mass with surrounding induration of R medial buttock lateral to R labial majora.   Wt Readings from Last 3 Encounters:  09/29/21 84 lb (38.1 kg)  07/03/21 87 lb 6.4 oz (39.6 kg)  05/11/21 88 lb 6.4 oz (40.1 kg)    Lab Results  Component Value Date   WBC 4.9 03/19/2020   HGB 12.1 03/19/2020   HCT 36.8 03/19/2020   PLT 159.0 03/19/2020   GLUCOSE 87 06/03/2021   CHOL 181 03/19/2020   TRIG 97.0 03/19/2020   HDL 68.70 03/19/2020   LDLDIRECT 127.9 10/19/2006   LDLCALC 93 03/19/2020   ALT 15 03/19/2020   AST 22 03/19/2020   NA 140 06/03/2021   K 3.8 06/03/2021   CL 94 (L) 06/03/2021   CREATININE 0.48 06/03/2021   BUN 17 06/03/2021   CO2 40 (H) 06/03/2021   TSH 0.55 03/19/2020   HGBA1C 5.4 03/19/2020   Incision and Drainage Procedure Note  Pre-operative Diagnosis: Abscess  Post-operative Diagnosis: same  Indications: Pain, infected abscess  Anesthesia: 1% lidocaine with epinephrine  Procedure Details  The procedure, risks and complications have been discussed in detail (including, but not limited to airway compromise, infection, bleeding) with the  patient, and the patient has signed consent to the procedure.  The skin was sterilely prepped and draped over the affected area in the usual fashion. After adequate local anesthesia, I&D with a #11 blade was performed on the right, medial . Purulent drainage: present The patient was observed until stable.  Findings: Infected abscess  EBL: Minimal cc's  Drains:  None  Condition: Tolerated procedure well and Stable  Complications: none.   Assessment/Plan:  Infected cyst of skin -Consent obtained.  I&D performed.  Patient tolerated procedure well. -Continue supportive care including sitz bath, warm compresses -Start ABX -Given Tylenol -Given precautions  - Plan: amoxicillin (AMOXIL) 500 MG tablet  COPD GOLD IV  -Stable -Continue continuous O2 via [place], albuterol inhaler as needed, Brovana, Pulmicort, Spiriva -Continue follow-up with pulmonology  Need for influenza vaccination  - Plan: Flu Vaccine QUAD High Dose(Fluad)  Essential hypertension -Elevated possibly 2/2 pain -Recheck -Continue current medications  F/u as needed  Abbe [place], MD

## 2021-10-01 DIAGNOSIS — J449 Chronic obstructive pulmonary disease, unspecified: Secondary | ICD-10-CM | POA: Diagnosis not present

## 2021-10-05 ENCOUNTER — Ambulatory Visit: Payer: Medicare Other | Admitting: Internal Medicine

## 2021-10-09 DIAGNOSIS — J449 Chronic obstructive pulmonary disease, unspecified: Secondary | ICD-10-CM | POA: Diagnosis not present

## 2021-10-12 DIAGNOSIS — J449 Chronic obstructive pulmonary disease, unspecified: Secondary | ICD-10-CM | POA: Diagnosis not present

## 2021-10-14 ENCOUNTER — Other Ambulatory Visit: Payer: Self-pay | Admitting: Family Medicine

## 2021-10-14 DIAGNOSIS — J449 Chronic obstructive pulmonary disease, unspecified: Secondary | ICD-10-CM

## 2021-10-20 ENCOUNTER — Ambulatory Visit: Payer: Medicare Other | Admitting: Internal Medicine

## 2021-10-20 ENCOUNTER — Encounter: Payer: Self-pay | Admitting: Internal Medicine

## 2021-10-20 DIAGNOSIS — R911 Solitary pulmonary nodule: Secondary | ICD-10-CM | POA: Diagnosis not present

## 2021-10-20 DIAGNOSIS — J9612 Chronic respiratory failure with hypercapnia: Secondary | ICD-10-CM

## 2021-10-20 DIAGNOSIS — J449 Chronic obstructive pulmonary disease, unspecified: Secondary | ICD-10-CM | POA: Diagnosis not present

## 2021-10-20 DIAGNOSIS — J9611 Chronic respiratory failure with hypoxia: Secondary | ICD-10-CM | POA: Diagnosis not present

## 2021-10-20 NOTE — Assessment & Plan Note (Addendum)
CT chest 06/10/21 -Diffuse abnormal soft tissue in the mediastinum, surrounding and attenuating the SVC, in the right paratracheal space and extending into the AP window and subcarinal station. Imaging features are concerning for neoplasm with lymphoma a distinct consideration. Consider PET-CT to further evaluate. 2. 6 mm left upper lobe pulmonary nodule with scattered tiny areas of architectural distortion/irregular nodularity in the upper lungs bilaterally. Attention on follow-up recommended. >>PET scan 06/29/21  >>>Moderate FDG uptake in the mediastinum more pronounced in subcarinal region, AP window and along the RIGHT mediastinum, findings are suspicious for lymphoproliferative disorder or small cell lung cancer.  Focal area of increased metabolic activity in the LEFT hemipelvis may be associated with bowel but is of uncertain significance. Consider contrasted imaging of the abdomen and pelvis if possible for further evaluation given the areas of low level activity noted about the mediastinum and the finding in the pelvis as well as the possible LEFT periaortic node with enlargement versus is retroperitoneal vasculature.  Discussed pet scan results with pt and daughter - suspect she does have underlying malignancy but it is not directly causing any symptoms we can palliate (pain/ blocking an airway/ hemoptysis) at this point but rather likely contributing to weakness/ anorexia/ wt loss as is her endstage copd   Strongly rec hospice when condition worsens rather than acute hospitalization but defer that to her /fm/ pcpd to make the call as to when this should occur   F/u here can be in 92 m, sooner prn          Each maintenance medication was reviewed in detail including emphasizing most importantly the difference between maintenance and prns and under what circumstances the prns are to be triggered using an action plan format where appropriate.  Total time for H and P, chart review,  counseling, reviewing hfa/smi/neb/02 device(s) and generating customized AVS unique to this office visit / same day charting = 35 min

## 2021-10-20 NOTE — Progress Notes (Addendum)
\\  Subjective:   Patient ID: Cathy Hensley, female    DOB: 1940/07/06  MRN: [id number]     Brief patient profile:  20 yowf  MZ quit smoking  2002  severe COPD: Prescribed Pulmicort, Brovana, & Spiriva.  Chronic hypoxic respiratory failure: on  2.5 lpm home/ 4 pulsed with ambulation  Chronic hypercarbic respiratory failure: Not currently on noninvasive positive pressure ventilation. Secondary to COPD. Chronic allergic rhinitis: Prescribed Singulair.      12/06/2016 acute extended ov/Lalita Ebel re:  GOLD IV/ 02 dep on brovana/bud/ spiriva  Chief Complaint  Patient presents with   Acute Visit    Increased SOB and cough for the past 3 days. She states sputum is hard to produce, but when she does it's grey and green.   on 2.5 lpm home and 4lpm pulsed with baseline = MMRC3 = can't walk 100 yards even at a slow pace at a flat grade s stopping due to sob   Slept ok in recliner night before ov but usually able to lie flat in bed s am HA  Acutely worse x 72 with congested cough thick and slt grey/ green worse in am  Comfortable at rest/ no needing extra saba  rec zpak  Prednisone 10 mg take  4 each am x 2 days,   2 each am x 2 days,  1 each am x 2 days and stop  - add:  F/u with ov in one week - if worse in meantime rx levaquin 750 daily x 5 days       NP recs  05/11/21  Augmentin 875mg  Twice daily  for 7 days , take with food.  Saline nasal rinses Twice daily   Saline nasal gel At bedtime   Continue on Spiriva daily  Continue on Brovana and Pulmicort Neb Twice daily   Continue on Singulair daily .  ProAir 2 puffs every 4hrs as needed wheezing .  Activity as tolerated High-protein diet, may try ensure between meals.  Continue on 2.5L/m oxygen . (Pulsing device 4l/m )  Chest xray today .    07/03/2021  f/u ov/Aalia Greulich re: GOLD 4 copd/ 02 dep    maint on brovana/bud/spiriva  Chief Complaint  Patient presents with   Follow-up    Pt states that her breathing is worse and she needs results for  a PET scan.  Dyspnea: still able to shop  leaning on cart with 02  2.5 lpm cont at HT sats in 90s  Cough: none even in am  Sleeping: on flat bed  2 pillows on side  SABA use: none  02: 2.5 24/7 Covid status:   vax x 4  Rec Work on inhaler technique:   Ok to try albuterol 15 min before an activity (on alternating days - like starter fluid)  that you know would usually make you short of breath  Make sure you check your oxygen saturation  AT  your highest level of activity (not after you stop)   to be sure it stays over 90%      10/20/2021  f/u ov/Arlene Brickel re: GOLD 4 copd/ 02 dep  maint on triple rx   Chief Complaint  Patient presents with   Follow-up    PT states no changes from last visit.   Dyspnea:  room to room is ok but no long pushing shopping cart  Cough: no am flares / flutter helps some  Sleeping: bed is flat 2 pillows  SABA use: rarely  02: 2.5  lpm     No obvious day to day or daytime variability or assoc excess/ purulent sputum or mucus plugs or hemoptysis or cp or chest tightness, subjective wheeze or overt sinus or hb symptoms.   Sleeping  without nocturnal  or early am exacerbation  of respiratory  c/o's or need for noct saba. Also denies any obvious fluctuation of symptoms with weather or environmental changes or other aggravating or alleviating factors except as outlined above   No unusual exposure hx or h/o childhood pna/ asthma or knowledge of premature birth.  Current Allergies, Complete Past Medical History, Past Surgical History, Family History, and Social History were reviewed in [employer] record.  ROS  The following are not active complaints unless bolded Hoarseness, sore throat, dysphagia, dental problems, itching, sneezing,  nasal congestion or discharge of excess mucus or purulent secretions, ear ache,   fever, chills, sweats, unintended wt loss or wt gain, classically pleuritic or exertional cp,  orthopnea pnd or arm/hand swelling  or  leg swelling, presyncope, palpitations, abdominal pain, anorexia, nausea, vomiting, diarrhea  or change in bowel habits or change in bladder habits, change in stools or change in urine, dysuria, hematuria,  rash, arthralgias, visual complaints, headache, numbness, weakness or ataxia or problems with walking or coordination,  change in mood or  memory.        No outpatient medications have been marked as taking for the 10/20/21 encounter (Office Visit) with Nyoka Cowden, MD.             Objective:   Physical Exam   wts  10/20/2021      87 approx   07/03/2021        87  01/09/2018          106  09/06/2017         109   12/15/2016    108   12/06/16 112 lb (50.8 kg)  09/28/16 113 lb (51.3 kg)  08/27/16 112 lb (50.8 kg)   Vital signs reviewed  10/20/2021  - Note at rest 02 sats  96% on 2.5 lpm    General appearance:    w/c bound elderly very frail / cachectic  wf nad    HEENT :  Oropharynx  clear      NECK :  without JVD/Nodes/TM/ nl carotid upstrokes bilaterally   LUNGS: no acc muscle use,  Mod barrel  contour chest wall with bilateral  Distant bs s audible wheeze and  without cough on insp or exp maneuvers and mod  Hyperresonant  to  percussion bilaterally     CV:  RRR  no s3 or murmur or increase in P2, and no edema   ABD:  soft and nontender with pos mid insp Hoover's  in the supine position. No bruits or organomegaly appreciated, bowel sounds nl  MS:   Ext warm without deformities or   obvious joint restrictions , calf tenderness, cyanosis or clubbing  SKIN: warm and dry without lesions    NEURO:  alert, approp, nl sensorium with  no motor or cerebellar deficits apparent.             PFT 01/22/15: FVC 1.58 L (53%) FEV1 0.58 L (26%) FEV1/FVC 0.37 FEF 25-75 0.23 L (13%) no bronchodilator response 11/05/14: FVC 1.52 L (51%) FEV1 0.49 L (22%) FEV1/FVC 0.32 FEF 25-75 0.20 L (11%) no bronchodilator response ERV 87% DLCO uncorrected 27%  Assessment &  Plan:    Outpatient Encounter Medications as of 10/20/2021  Medication Sig   acetaminophen (TYLENOL) 500 MG tablet Take 500 mg by mouth every 6 (six) hours as needed.   albuterol (VENTOLIN HFA) 108 (90 Base) MCG/ACT inhaler INHALE 2 PUFFS INTO THE LUNGS EVERY 6 HOURS AS NEEDED FOR WHEEZING OR SHORTNESS OF BREATH   amLODipine (NORVASC) 5 MG tablet TAKE 1 TABLET(5 MG) BY MOUTH DAILY   arformoterol (BROVANA) 15 MCG/2ML NEBU USE 1 VIAL  IN  NEBULIZER TWICE  DAILY - morning and evening   aspirin 81 MG chewable tablet Chew 1 tablet (81 mg total) by mouth daily.   budesonide (PULMICORT) 0.25 MG/2ML nebulizer solution Use 1 vial in nebulizer twice daily.  Once mouth after treatment.   dextromethorphan-guaiFENesin (MUCINEX DM) 30-600 MG 12hr tablet Take 1 tablet by mouth 2 (two) times daily.   montelukast (SINGULAIR) 10 MG tablet TAKE 1 TABLET(10 MG) BY MOUTH AT BEDTIME   Multiple Vitamins-Minerals (CENTRUM SILVER PO) Take 1 tablet by mouth daily.   OXYGEN 2.5 lpm 24/7 (she uses 4lpm pulsed when on POC)   Respiratory Therapy Supplies (FLUTTER) DEVI 1 Device by Does not apply route as needed.   simvastatin (ZOCOR) 20 MG tablet TAKE 1 TABLET(20 MG) BY MOUTH AT BEDTIME   Tiotropium Bromide Monohydrate (SPIRIVA RESPIMAT) 2.5 MCG/ACT AERS INHALE 2 PUFFS INTO THE LUNGS DAILY   No facility-administered encounter medications on file as of 10/20/2021.

## 2021-10-20 NOTE — Assessment & Plan Note (Signed)
HCO3  09/28/16 =  35  HCO3  06/03/21 =  40   As of 10/20/2021  rec 2-3 lpm titrated to sats around 90%   Advised risk of hypercarbia with pushing 02 sats too high

## 2021-10-20 NOTE — Assessment & Plan Note (Addendum)
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) PFT 01/22/15: FVC 1.58 L (53%) FEV1 0.58 L (26%) FEV1/FVC 0.37 FEF 25-75 0.23 L (13%) no bronchodilator response 11/05/14:FVC 1.52 L (51%) FEV1 0.49 L (22%) FEV1/FVC 0.32 FEF 25-75 0.20 L (11%) no bronchodilator response ERV 87% DLCO uncorrected 27% - trained on flutter use 12/15/2016  - 07/03/2021  After extensive coaching inhaler device,  effectiveness =    75% from baseline 25% with SMI > continue spiriva plus neb laba / ics   Group D (now reclassified as E) in terms of symptom/risk and laba/lama/ICS  therefore appropriate rx at this point >>>  spiriva /brovana/bud should be adequate plus prn saba  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

## 2021-10-20 NOTE — Patient Instructions (Addendum)
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   Please schedule a follow up visit in 6 months but call sooner if needed    .

## 2021-10-27 ENCOUNTER — Other Ambulatory Visit: Payer: Self-pay | Admitting: Family Medicine

## 2021-10-27 DIAGNOSIS — E782 Mixed hyperlipidemia: Secondary | ICD-10-CM

## 2021-10-28 ENCOUNTER — Other Ambulatory Visit: Payer: Self-pay | Admitting: Family Medicine

## 2021-10-28 DIAGNOSIS — E782 Mixed hyperlipidemia: Secondary | ICD-10-CM

## 2021-10-28 DIAGNOSIS — J449 Chronic obstructive pulmonary disease, unspecified: Secondary | ICD-10-CM | POA: Diagnosis not present

## 2021-11-09 DIAGNOSIS — J449 Chronic obstructive pulmonary disease, unspecified: Secondary | ICD-10-CM | POA: Diagnosis not present

## 2021-11-12 DIAGNOSIS — J449 Chronic obstructive pulmonary disease, unspecified: Secondary | ICD-10-CM | POA: Diagnosis not present

## 2021-11-27 ENCOUNTER — Other Ambulatory Visit: Payer: Self-pay | Admitting: Family Medicine

## 2021-11-27 DIAGNOSIS — E782 Mixed hyperlipidemia: Secondary | ICD-10-CM

## 2021-12-01 DIAGNOSIS — J449 Chronic obstructive pulmonary disease, unspecified: Secondary | ICD-10-CM | POA: Diagnosis not present

## 2021-12-09 DIAGNOSIS — J449 Chronic obstructive pulmonary disease, unspecified: Secondary | ICD-10-CM | POA: Diagnosis not present

## 2021-12-12 DIAGNOSIS — J449 Chronic obstructive pulmonary disease, unspecified: Secondary | ICD-10-CM | POA: Diagnosis not present

## 2021-12-27 ENCOUNTER — Other Ambulatory Visit: Payer: Self-pay | Admitting: Family Medicine

## 2021-12-27 DIAGNOSIS — I10 Essential (primary) hypertension: Secondary | ICD-10-CM

## 2021-12-27 DIAGNOSIS — J449 Chronic obstructive pulmonary disease, unspecified: Secondary | ICD-10-CM

## 2021-12-29 DIAGNOSIS — J449 Chronic obstructive pulmonary disease, unspecified: Secondary | ICD-10-CM | POA: Diagnosis not present

## 2022-01-09 DIAGNOSIS — J449 Chronic obstructive pulmonary disease, unspecified: Secondary | ICD-10-CM | POA: Diagnosis not present

## 2022-01-12 DIAGNOSIS — J449 Chronic obstructive pulmonary disease, unspecified: Secondary | ICD-10-CM | POA: Diagnosis not present

## 2022-01-28 ENCOUNTER — Ambulatory Visit (INDEPENDENT_AMBULATORY_CARE_PROVIDER_SITE_OTHER): Payer: Medicare Other

## 2022-01-28 VITALS — Ht 64.0 in | Wt 87.0 lb

## 2022-01-28 DIAGNOSIS — Z0000 Encounter for general adult medical examination without abnormal findings: Secondary | ICD-10-CM | POA: Diagnosis not present

## 2022-01-28 DIAGNOSIS — J449 Chronic obstructive pulmonary disease, unspecified: Secondary | ICD-10-CM | POA: Diagnosis not present

## 2022-01-28 NOTE — Patient Instructions (Addendum)
Cathy Hensley , Thank you for taking time to come for your Medicare Wellness Visit. I appreciate your ongoing commitment to your health goals. Please review the following plan we discussed and let me know if I can assist you in the future.   These are the goals we discussed:  Goals      Exercise 3x per week (30 min per time)     No current goals        This is a list of the screening recommended for you and due dates:  Health Maintenance  Topic Date Due   COVID-19 Vaccine (5 - 2023-24 season) 02/13/2022*   Zoster (Shingles) Vaccine (1 of 2) 04/29/2022*   DEXA scan (bone density measurement)  01/29/2023*   Medicare Annual Wellness Visit  01/29/2023   DTaP/Tdap/Td vaccine (4 - Td or Tdap) 02/06/2029   Pneumonia Vaccine  Completed   Flu Shot  Completed   HPV Vaccine  Aged Out   Mammogram  Discontinued  *Topic was postponed. The date shown is not the original due date.    Advanced directives: Please bring a copy of your health care power of attorney and living will to the office to be added to your chart at your convenience.   Conditions/risks identified: None  Next appointment: Follow up in one year for your annual wellness visit     Preventive Care 65 Years and Older, Female Preventive care refers to lifestyle choices and visits with your health care provider that can promote health and wellness. What does preventive care include? A yearly physical exam. This is also called an annual well check. Dental exams once or twice a year. Routine eye exams. Ask your health care provider how often you should have your eyes checked. Personal lifestyle choices, including: Daily care of your teeth and gums. Regular physical activity. Eating a healthy diet. Avoiding tobacco and drug use. Limiting alcohol use. Practicing safe sex. Taking low-dose aspirin every day. Taking vitamin and mineral supplements as recommended by your health care provider. What happens during an annual well  check? The services and screenings done by your health care provider during your annual well check will depend on your age, overall health, lifestyle risk factors, and family history of disease. Counseling  Your health care provider may ask you questions about your: Alcohol use. Tobacco use. Drug use. Emotional well-being. Home and relationship well-being. Sexual activity. Eating habits. History of falls. Memory and ability to understand (cognition). Work and work Statistician. Reproductive health. Screening  You may have the following tests or measurements: Height, weight, and BMI. Blood pressure. Lipid and cholesterol levels. These may be checked every 5 years, or more frequently if you are over 55 years old. Skin check. Lung cancer screening. You may have this screening every year starting at age 48 if you have a 30-pack-year history of smoking and currently smoke or have quit within the past 15 years. Fecal occult blood test (FOBT) of the stool. You may have this test every year starting at age 75. Flexible sigmoidoscopy or colonoscopy. You may have a sigmoidoscopy every 5 years or a colonoscopy every 10 years starting at age 85. Hepatitis C blood test. Hepatitis B blood test. Sexually transmitted disease (STD) testing. Diabetes screening. This is done by checking your blood sugar (glucose) after you have not eaten for a while (fasting). You may have this done every 1-3 years. Bone density scan. This is done to screen for osteoporosis. You may have this done starting at age  65. Mammogram. This may be done every 1-2 years. Talk to your health care provider about how often you should have regular mammograms. Talk with your health care provider about your test results, treatment options, and if necessary, the need for more tests. Vaccines  Your health care provider may recommend certain vaccines, such as: Influenza vaccine. This is recommended every year. Tetanus, diphtheria, and  acellular pertussis (Tdap, Td) vaccine. You may need a Td booster every 10 years. Zoster vaccine. You may need this after age 47. Pneumococcal 13-valent conjugate (PCV13) vaccine. One dose is recommended after age 89. Pneumococcal polysaccharide (PPSV23) vaccine. One dose is recommended after age 86. Talk to your health care provider about which screenings and vaccines you need and how often you need them. This information is not intended to replace advice given to you by your health care provider. Make sure you discuss any questions you have with your health care provider. Document Released: 01/17/2015 Document Revised: 09/10/2015 Document Reviewed: 10/22/2014 Elsevier Interactive Patient Education  2017 [place] Prevention in the Home Falls can cause injuries. They can happen to people of all ages. There are many things you can do to make your home safe and to help prevent falls. What can I do on the outside of my home? Regularly fix the edges of walkways and driveways and fix any cracks. Remove anything that might make you trip as you walk through a door, such as a raised step or threshold. Trim any bushes or trees on the path to your home. Use bright outdoor lighting. Clear any walking paths of anything that might make someone trip, such as rocks or tools. Regularly check to see if handrails are loose or broken. Make sure that both sides of any steps have handrails. Any raised decks and porches should have guardrails on the edges. Have any leaves, snow, or ice cleared regularly. Use sand or salt on walking paths during winter. Clean up any spills in your garage right away. This includes oil or grease spills. What can I do in the bathroom? Use night lights. Install grab bars by the toilet and in the tub and shower. Do not use towel bars as grab bars. Use non-skid mats or decals in the tub or shower. If you need to sit down in the shower, use a plastic, non-slip stool. Keep  the floor dry. Clean up any water that spills on the floor as soon as it happens. Remove soap buildup in the tub or shower regularly. Attach bath mats securely with double-sided non-slip rug tape. Do not have throw rugs and other things on the floor that can make you trip. What can I do in the bedroom? Use night lights. Make sure that you have a light by your bed that is easy to reach. Do not use any sheets or blankets that are too big for your bed. They should not hang down onto the floor. Have a firm chair that has side arms. You can use this for support while you get dressed. Do not have throw rugs and other things on the floor that can make you trip. What can I do in the kitchen? Clean up any spills right away. Avoid walking on wet floors. Keep items that you use a lot in easy-to-reach places. If you need to reach something above you, use a strong step stool that has a grab bar. Keep electrical cords out of the way. Do not use floor polish or wax that makes floors slippery.  If you must use wax, use non-skid floor wax. Do not have throw rugs and other things on the floor that can make you trip. What can I do with my stairs? Do not leave any items on the stairs. Make sure that there are handrails on both sides of the stairs and use them. Fix handrails that are broken or loose. Make sure that handrails are as long as the stairways. Check any carpeting to make sure that it is firmly attached to the stairs. Fix any carpet that is loose or worn. Avoid having throw rugs at the top or bottom of the stairs. If you do have throw rugs, attach them to the floor with carpet tape. Make sure that you have a light switch at the top of the stairs and the bottom of the stairs. If you do not have them, ask someone to add them for you. What else can I do to help prevent falls? Wear shoes that: Do not have high heels. Have rubber bottoms. Are comfortable and fit you well. Are closed at the toe. Do not  wear sandals. If you use a stepladder: Make sure that it is fully opened. Do not climb a closed stepladder. Make sure that both sides of the stepladder are locked into place. Ask someone to hold it for you, if possible. Clearly mark and make sure that you can see: Any grab bars or handrails. First and last steps. Where the edge of each step is. Use tools that help you move around (mobility aids) if they are needed. These include: Canes. Walkers. Scooters. Crutches. Turn on the lights when you go into a dark area. Replace any light bulbs as soon as they burn out. Set up your furniture so you have a clear path. Avoid moving your furniture around. If any of your floors are uneven, fix them. If there are any pets around you, be aware of where they are. Review your medicines with your doctor. Some medicines can make you feel dizzy. This can increase your chance of falling. Ask your doctor what other things that you can do to help prevent falls. This information is not intended to replace advice given to you by your health care provider. Make sure you discuss any questions you have with your health care provider. Document Released: 10/17/2008 Document Revised: 05/29/2015 Document Reviewed: 01/25/2014 Elsevier Interactive Patient Education  2017 [employer].

## 2022-01-28 NOTE — Progress Notes (Signed)
Subjective:   Cathy Hensley is a 82 y.o. female who presents for Medicare Annual (Subsequent) preventive examination.  Review of Systems    Virtual Visit via Telephone Note  I connected with  [place] on 01/28/22 at 11:15 AM EST by telephone and verified that I am speaking with the correct person using two identifiers.  Location: Patient: Home Provider: Office Persons participating in the virtual visit: patient/Nurse Health Advisor   I discussed the limitations, risks, security and privacy concerns of performing an evaluation and management service by telephone and the availability of in person appointments. The patient expressed understanding and agreed to proceed.  Interactive audio and video telecommunications were attempted between this nurse and patient, however failed, due to patient having technical difficulties OR patient did not have access to video capability.  We continued and completed visit with audio only.  Some vital signs may be absent or patient reported.   Criselda Peaches, LPN  Cardiac Risk Factors include: advanced age (>32men, >33 women);hypertension;dyslipidemia;Other (see comment), Risk factor comments: DX COPD     Objective:    Today's Vitals   01/28/22 1120  Weight: 87 lb (39.5 kg)  Height: 5\' 4"  (1.626 m)   Body mass index is 14.93 kg/m.     01/28/2022   11:33 AM 01/27/2021   11:48 AM 02/04/2020    2:59 PM 06/27/2019   12:01 PM 02/07/2019    2:34 PM 01/10/2015   11:10 AM 09/24/2014    2:32 PM  Advanced Directives  Does Patient Have a Medical Advance Directive? Yes Yes Yes Yes No Yes Yes  Type of Paramedic of [place];Living will [place];Living will [place];Living will [place];Living will  [place];Living will [place];Living will  Does patient want to make changes to medical advance directive?  No - Patient declined  No - Patient declined No - Patient declined  No - Patient declined   Copy of [place] in Chart? No - copy requested No - copy requested No - copy requested No - copy requested  No - copy requested   Would patient like information on creating a medical advance directive?     No - Patient declined      Current Medications (verified) Outpatient Encounter Medications as of 01/28/2022  Medication Sig   acetaminophen (TYLENOL) 500 MG tablet Take 500 mg by mouth every 6 (six) hours as needed.   albuterol (VENTOLIN HFA) 108 (90 Base) MCG/ACT inhaler INHALE 2 PUFFS INTO THE LUNGS EVERY 6 HOURS AS NEEDED FOR WHEEZING OR SHORTNESS OF BREATH   amLODipine (NORVASC) 5 MG tablet TAKE 1 TABLET(5 MG) BY MOUTH DAILY   arformoterol (BROVANA) 15 MCG/2ML NEBU USE 1 VIAL  IN  NEBULIZER TWICE  DAILY - morning and evening   aspirin 81 MG chewable tablet Chew 1 tablet (81 mg total) by mouth daily.   budesonide (PULMICORT) 0.25 MG/2ML nebulizer solution Use 1 vial in nebulizer twice daily.  Once mouth after treatment.   dextromethorphan-guaiFENesin (MUCINEX DM) 30-600 MG 12hr tablet Take 1 tablet by mouth 2 (two) times daily.   montelukast (SINGULAIR) 10 MG tablet TAKE 1 TABLET(10 MG) BY MOUTH AT BEDTIME   Multiple Vitamins-Minerals (CENTRUM SILVER PO) Take 1 tablet by mouth daily.   OXYGEN 2.5 lpm 24/7 (she uses 4lpm pulsed when on POC)   Respiratory Therapy Supplies (FLUTTER) DEVI 1 Device by Does not apply route  as needed.   simvastatin (ZOCOR) 20 MG tablet TAKE 1 TABLET(20 MG) BY MOUTH AT BEDTIME   Tiotropium Bromide Monohydrate (SPIRIVA RESPIMAT) 2.5 MCG/ACT AERS INHALE 2 PUFFS INTO THE LUNGS DAILY   No facility-administered encounter medications on file as of 01/28/2022.    Allergies (verified) Patient has no known allergies.   History: Past Medical History:  Diagnosis Date   Alpha-1-antitrypsin deficiency carrier    COPD 07/13/2006   HYPERTENSION 07/13/2006   MENOPAUSAL SYNDROME 07/13/2006    Other and unspecified hyperlipidemia 07/13/2006   Respiratory failure (HCC)    Chronic hypoxic   Past Surgical History:  Procedure Laterality Date   TONSILLECTOMY     Family History  Problem Relation Age of Onset   AAA (abdominal aortic aneurysm) Mother    Emphysema Mother    Aneurysm Sister        brain   Emphysema Father    Lymphoma Sister    Social History   Socioeconomic History   Marital status: Married    Spouse name: Not on file   Number of children: 2   Years of education: Not on file   Highest education level: Not on file  Occupational History   Occupation: retired  Tobacco Use   Smoking status: Former    Packs/day: 0.50    Years: 42.00    Total pack years: 21.00    Types: Cigarettes    Quit date: 09/23/2000    Years since quitting: 21.3    Passive exposure: Past   Smokeless tobacco: Never  Substance and Sexual Activity   Alcohol use: Yes    Alcohol/week: 1.0 standard drink of alcohol    Types: 1 Standard drinks or equivalent per week    Comment: 1 glass of wine nearly daily.   Drug use: No   Sexual activity: Not on file  Other Topics Concern   Not on file  Social History Narrative   Lives at home with her husband. Retired.       Pulmonary:   She is originally from [place]. She has 2 daughters, one lives in [place] & the other in [place]. Previously worked Clinical cytogeneticist a Musician, ran a Lawyer, clerical work, Lawyer. She has no pets currently. No previous bird, mold, or hot tub exposure.    Social Determinants of Health   Financial Resource Strain: Low Risk  (01/28/2022)   Overall Financial Resource Strain (CARDIA)    Difficulty of Paying Living Expenses: Not hard at all  Food Insecurity: No Food Insecurity (01/28/2022)   Hunger Vital Sign    Worried About Running Out of Food in the Last Year: Never true    Ran Out of Food in the Last Year: Never true  Transportation Needs: No Transportation Needs (01/28/2022)    PRAPARE - Administrator, Civil Service (Medical): No    Lack of Transportation (Non-Medical): No  Physical Activity: Inactive (01/28/2022)   Exercise Vital Sign    Days of Exercise per Week: 0 days    Minutes of Exercise per Session: 0 min  Stress: No Stress Concern Present (01/28/2022)   [employer] of Occupational Health - Occupational Stress Questionnaire    Feeling of Stress : Not at all  Social Connections: Moderately Isolated (01/28/2022)   Social Connection and Isolation Panel [NHANES]    Frequency of Communication with Friends and Family: More than three times a week    Frequency of Social Gatherings with Friends and Family: More than three times  a week    Attends Religious Services: Never    Active Member of Clubs or Organizations: No    Attends Engineer, structural: Never    Marital Status: Married    Tobacco Counseling Counseling given: Not Answered   Clinical Intake:  Pre-visit preparation completed: No  Pain : No/denies pain     BMI - recorded: 14.93 Nutritional Status: BMI <19  Underweight Nutritional Risks: None Diabetes: No  How often do you need to have someone help you when you read instructions, pamphlets, or other written materials from your doctor or pharmacy?: 3 - Sometimes (Family Assist)  Diabetic?  No  Interpreter Needed?: No  Information entered by :: Theresa Mulligan LPN   Activities of Daily Living    01/28/2022   11:29 AM  In your present state of health, do you have any difficulty performing the following activities:  Hearing? 0  Vision? 0  Difficulty concentrating or making decisions? 0  Walking or climbing stairs? 1  Comment Dx COPD Followed by PCP  Dressing or bathing? 1  Comment Husband assist  Doing errands, shopping? 1  Comment Family Ship broker and eating ? Y  Comment Husband assist  Using the Toilet? N  In the past six months, have you accidently leaked urine? N  Do you have problems  with loss of bowel control? N  Managing your Medications? N  Managing your Finances? N  Housekeeping or managing your Housekeeping? Gaspar Skeeters Housekeper assist    Patient Care Team: Deeann Saint, MD as PCP - General (Family Medicine) Alfredo Martinez, MD as Consulting Physician (Urology) Nyoka Cowden, MD as Consulting Physician (Pulmonary Disease)  Indicate any recent Medical Services you may have received from other than [facility] providers in the past year (date may be approximate).     Assessment:   This is a routine wellness examination for Lilyana.  Hearing/Vision screen Hearing Screening - Comments:: Denies hearing difficulties   Vision Screening - Comments:: Wears rx glasses - up to date with routine eye exams with  [facility]  Dietary issues and exercise activities discussed: Exercise limited by: respiratory conditions(s)   Goals Addressed             This Visit's Progress    No current goals         Depression Screen    01/28/2022   11:28 AM 01/27/2021   11:29 AM 02/04/2020    3:03 PM 04/10/2015   11:55 AM 01/10/2015   11:14 AM 12/16/2014   11:24 AM 01/19/2013    8:37 AM  PHQ 2/9 Scores  PHQ - 2 Score 0 0 0 0 2 0 0  PHQ- 9 Score     4      Fall Risk    01/28/2022   11:32 AM 01/27/2021   11:42 AM 02/04/2020    3:02 PM 01/10/2015   11:14 AM 12/16/2014   11:24 AM  Fall Risk   Falls in the past year? 0 0 0 No No  Number falls in past yr: 0 0 0    Injury with Fall? 0 0 0    Risk for fall due to : No Fall Risks  No Fall Risks    Follow up Falls prevention discussed  Falls evaluation completed;Falls prevention discussed      FALL RISK PREVENTION PERTAINING TO THE HOME:  Any stairs in or around the home? Yes  If so, are there any without handrails? No  Home free of loose throw rugs in walkways, pet beds, electrical cords, etc? Yes  Adequate lighting in your home to reduce risk of falls? Yes   ASSISTIVE DEVICES UTILIZED TO PREVENT FALLS:  Life  alert? No  Use of a cane, walker or w/c? Yes Grab bars in the bathroom? Yes  Shower chair or bench in shower? No  Elevated toilet seat or a handicapped toilet? No   TIMED UP AND GO:  Was the test performed? No . Audio Visit    Cognitive Function:        01/28/2022   11:33 AM 01/27/2021   11:45 AM  6CIT Screen  What Year? 0 points 0 points  What month? 0 points 0 points  What time? 0 points 0 points  Count back from 20 0 points 0 points  Months in reverse 0 points 0 points  Repeat phrase 0 points 0 points  Total Score 0 points 0 points    Immunizations Immunization History  Administered Date(s) Administered   Fluad Quad(high Dose 65+) 09/21/2018, 09/21/2019, 10/08/2020, 09/29/2021   Influenza Split 10/05/2010, 11/15/2012   Influenza, High Dose Seasonal PF 09/21/2013, 10/01/2014, 09/06/2017   Influenza,inj,Quad PF,6+ Mos 08/27/2016   Influenza-Unspecified 08/24/2015   PFIZER(Purple Top)SARS-COV-2 Vaccination 03/03/2019, 03/20/2019, 10/04/2019, 10/23/2020   Pneumococcal Conjugate-13 01/19/2013   Pneumococcal Polysaccharide-23 01/04/2005, 03/19/2010   Td 09/05/2003   Tdap 06/29/2010, 02/07/2019    TDAP status: Up to date  Flu Vaccine status: Up to date  Pneumococcal vaccine status: Up to date  Covid-19 vaccine status: Completed vaccines  Qualifies for Shingles Vaccine? Yes   Zostavax completed No   Shingrix Completed?: No.    Education has been provided regarding the importance of this vaccine. Patient has been advised to call insurance company to determine out of pocket expense if they have not yet received this vaccine. Advised may also receive vaccine at local pharmacy or Health Dept. Verbalized acceptance and understanding.  Screening Tests Health Maintenance  Topic Date Due   COVID-19 Vaccine (5 - 2023-24 season) 02/13/2022 (Originally 09/04/2021)   Zoster Vaccines- Shingrix (1 of 2) 04/29/2022 (Originally 04/12/1991)   DEXA SCAN  01/29/2023 (Originally  04/12/2006)   Medicare Annual Wellness (AWV)  01/29/2023   DTaP/Tdap/Td (4 - Td or Tdap) 02/06/2029   Pneumonia Vaccine 53+ Years old  Completed   INFLUENZA VACCINE  Completed   HPV VACCINES  Aged Out   MAMMOGRAM  Discontinued    Health Maintenance  There are no preventive care reminders to display for this patient.   Colorectal cancer screening: No longer required.   Mammogram status: No longer required due to Age.  Bone Density status: Ordered Patient deferred. Pt provided with contact info and advised to call to schedule appt.  Lung Cancer Screening: (Low Dose CT Chest recommended if Age 22-80 years, 30 pack-year currently smoking OR have quit w/in 15years.) does not qualify.     Additional Screening:  Hepatitis C Screening: does not qualify; Completed   Vision Screening: Recommended annual ophthalmology exams for early detection of glaucoma and other disorders of the eye. Is the patient up to date with their annual eye exam?  Yes  Who is the provider or what is the name of the office in which the patient attends annual eye exams? [facility] If pt is not established with a provider, would they like to be referred to a provider to establish care? No .   Dental Screening: Recommended annual dental exams for proper oral hygiene  Community Resource Referral / Chronic Care Management:  CRR required this visit?  No   CCM required this visit?  No      Plan:     I have personally reviewed and noted the following in the patient's chart:   Medical and social history Use of alcohol, tobacco or illicit drugs  Current medications and supplements including opioid prescriptions. Patient is not currently taking opioid prescriptions. Functional ability and status Nutritional status Physical activity Advanced directives List of other physicians Hospitalizations, surgeries, and ER visits in previous 12 months Vitals Screenings to include cognitive, depression, and  falls Referrals and appointments  In addition, I have reviewed and discussed with patient certain preventive protocols, quality metrics, and best practice recommendations. A written personalized care plan for preventive services as well as general preventive health recommendations were provided to patient.     Criselda Peaches, LPN   3/41/9622   Nurse Notes: None

## 2022-02-04 ENCOUNTER — Ambulatory Visit (INDEPENDENT_AMBULATORY_CARE_PROVIDER_SITE_OTHER): Payer: Medicare Other | Admitting: Family Medicine

## 2022-02-04 ENCOUNTER — Encounter: Payer: Self-pay | Admitting: Family Medicine

## 2022-02-04 VITALS — BP 146/50 | HR 87 | Temp 98.8°F | Ht 64.0 in | Wt 87.4 lb

## 2022-02-04 DIAGNOSIS — I10 Essential (primary) hypertension: Secondary | ICD-10-CM | POA: Diagnosis not present

## 2022-02-04 DIAGNOSIS — R600 Localized edema: Secondary | ICD-10-CM

## 2022-02-04 DIAGNOSIS — R410 Disorientation, unspecified: Secondary | ICD-10-CM | POA: Diagnosis not present

## 2022-02-04 DIAGNOSIS — J0100 Acute maxillary sinusitis, unspecified: Secondary | ICD-10-CM | POA: Diagnosis not present

## 2022-02-04 DIAGNOSIS — J9611 Chronic respiratory failure with hypoxia: Secondary | ICD-10-CM | POA: Diagnosis not present

## 2022-02-04 DIAGNOSIS — J9612 Chronic respiratory failure with hypercapnia: Secondary | ICD-10-CM

## 2022-02-04 DIAGNOSIS — R319 Hematuria, unspecified: Secondary | ICD-10-CM | POA: Diagnosis not present

## 2022-02-04 DIAGNOSIS — J449 Chronic obstructive pulmonary disease, unspecified: Secondary | ICD-10-CM | POA: Diagnosis not present

## 2022-02-04 DIAGNOSIS — L720 Epidermal cyst: Secondary | ICD-10-CM

## 2022-02-04 LAB — POCT URINALYSIS DIPSTICK
Bilirubin, UA: NEGATIVE
Blood, UA: POSITIVE
Glucose, UA: NEGATIVE
Ketones, UA: NEGATIVE
Leukocytes, UA: NEGATIVE
Nitrite, UA: NEGATIVE
Protein, UA: POSITIVE — AB
Spec Grav, UA: 1.015 (ref 1.010–1.025)
Urobilinogen, UA: 0.2 E.U./dL
pH, UA: 6.5 (ref 5.0–8.0)

## 2022-02-04 MED ORDER — AMOXICILLIN 500 MG PO TABS: 500.0000 mg | ORAL_TABLET | Freq: Two times a day (BID) | ORAL | 0 refills | Status: AC

## 2022-02-04 NOTE — Patient Instructions (Signed)
Your urine came back negative for UTI but there was some blood noted in the sample.  We will send this off to the lab to look at it more closely.  We can have you come back to clinic in the next few weeks to further evaluate the memory concerns.    A prescription for an antibiotic, amoxicillin, was sent in for your sinus infection.

## 2022-02-04 NOTE — Progress Notes (Signed)
Established Patient Office Visit   Subjective  Patient ID: Cathy Hensley, female    DOB: Oct 10, 1940  Age: 82 y.o. MRN: [id number]  Chief Complaint  Patient presents with   Edema    Pt reports swelling on lower legs. Noticed 2 wks ago.   Sinus Problem    Pt reports sinus pressure, headache, sneezing, watery eyes and nose pain. Sx going on for a while for over a month. Pt thinks it might be due to her using oxygen 24/7.    OTHER    Daughter came with patient. She reports pt woke up at night disoriented/confused   Pt accompanied by her daughter.  Pt is an 82 yo female with pmh sig for COPD, HTN, lung nodule, HLD, malnutrition, alpha-1 antitrypsin deficiency carrier who is seen for acute concern.  Pt's daughter expresses concern about pt's memory.  Pt seems confused at night getting up thinking its time to get dressed.  Pt endorses sneezing, HAs, watery eyes, nose pain.  Pt with LE edema x 2 weeks.  Endorses compliance with medications.  Does not like drinking water.  On 3 L O2 continuously.   Patient notes increased marital stress as she is expected to do the cooking.  Patient often gets tired after fixing a meal for her husband and may not eat much.  Pt states cyst on L upper neck/posterior scalp increased in size.  Interested in removal.  Denies pain or drainage.      ROS Negative unless stated above    Objective:     BP (!) 168/60 (BP Location: Right Arm, Patient Position: Sitting, Cuff Size: Normal)   Pulse 87   Temp 98.8 F (37.1 C) (Oral)   Ht 5' 4"$  (1.626 m)   Wt 87 lb 6.4 oz (39.6 kg)   SpO2 95%   BMI 15.00 kg/m    Physical Exam Constitutional:      General: She is not in acute distress.    Appearance: Normal appearance.     Comments: On O2 via [place], poratable tank.  HENT:     Head: Normocephalic and atraumatic.     Nose: Nose normal.     Mouth/Throat:     Mouth: Mucous membranes are moist.  Eyes:     Conjunctiva/sclera: Conjunctivae normal.     Pupils:  Pupils are equal, round, and reactive to light.  Cardiovascular:     Rate and Rhythm: Normal rate and regular rhythm.     Heart sounds: Normal heart sounds. No murmur heard.    No gallop.  Pulmonary:     Effort: Pulmonary effort is normal. No respiratory distress.     Breath sounds: Normal breath sounds. No wheezing, rhonchi or rales.  Musculoskeletal:     Right lower leg: Edema present.     Left lower leg: Edema present.  Skin:    General: Skin is warm and dry.     Comments: Epidermoid cyst of posterior/lateral neck near scalp.  Neurological:     Mental Status: She is alert and oriented to person, place, and time.  Psychiatric:        Mood and Affect: Mood normal.        Behavior: Behavior normal.      No results found for any visits on 02/04/22.    Assessment & Plan:  Acute maxillary sinusitis, recurrence not specified -     Amoxicillin; Take 1 tablet (500 mg total) by mouth 2 (two) times daily for 7 days.  Dispense:  14 tablet; Refill: 0  COPD GOLD IV  -continue inhalers and continuous O2 -continue f/u with Pulmonology -     CBC with Differential/Platelet  Essential hypertension -     TSH -     Comprehensive metabolic panel  Bilateral leg edema -discussed various causes including malnutrition, prolonged sitting, CHF, etc. -Supportive care including elevating LEs when sitting-/compression socks, increasing protein intake. -     TSH -     Comprehensive metabolic panel -     Brain natriuretic peptide  Epidermoid cyst of neck -     Ambulatory referral to Dermatology  Confusion -Close likely 2/2 UTI as UA with hematuria, protein. -Worsened hypoxia may be contributing -     POCT urinalysis dipstick  Chronic respiratory failure with hypoxia and hypercapnia (HCC) -Stable -Continue continuous O2 via [place] -Continue follow-up with pulmonology  Hematuria, unspecified type -     Urinalysis, Routine w reflex microscopic; Future    Return in about 2 weeks (around  02/18/2022).  To further evaluate memory.  Cathy Ruddy, MD

## 2022-02-05 LAB — COMPREHENSIVE METABOLIC PANEL
ALT: 13 U/L (ref 0–35)
AST: 19 U/L (ref 0–37)
Albumin: 4.3 g/dL (ref 3.5–5.2)
Alkaline Phosphatase: 44 U/L (ref 39–117)
BUN: 14 mg/dL (ref 6–23)
CO2: 47 mEq/L — ABNORMAL HIGH (ref 19–32)
Calcium: 9.7 mg/dL (ref 8.4–10.5)
Chloride: 94 mEq/L — ABNORMAL LOW (ref 96–112)
Creatinine, Ser: 0.37 mg/dL — ABNORMAL LOW (ref 0.40–1.20)
GFR: 94.98 mL/min (ref >60.00)
Glucose, Bld: 87 mg/dL (ref 70–99)
Potassium: 4.4 mEq/L (ref 3.5–5.1)
Sodium: 146 mEq/L — ABNORMAL HIGH (ref 135–145)
Total Bilirubin: 0.5 mg/dL (ref 0.2–1.2)
Total Protein: 6.4 g/dL (ref 6.0–8.3)

## 2022-02-05 LAB — URINALYSIS, ROUTINE W REFLEX MICROSCOPIC
Bilirubin Urine: NEGATIVE
Leukocytes,Ua: NEGATIVE
Nitrite: NEGATIVE
Specific Gravity, Urine: 1.02 (ref 1.000–1.030)
Total Protein, Urine: 30 — AB
Urine Glucose: NEGATIVE
Urobilinogen, UA: 0.2 (ref 0.0–1.0)
pH: 7 (ref 5.0–8.0)

## 2022-02-05 LAB — BRAIN NATRIURETIC PEPTIDE: Pro B Natriuretic peptide (BNP): 36 pg/mL (ref 0.0–100.0)

## 2022-02-05 LAB — CBC WITH DIFFERENTIAL/PLATELET
Basophils Absolute: 0 10*3/uL (ref 0.0–0.1)
Basophils Relative: 1 % (ref 0.0–3.0)
Eosinophils Absolute: 0 10*3/uL (ref 0.0–0.7)
Eosinophils Relative: 0.3 % (ref 0.0–5.0)
HCT: 31.4 % — ABNORMAL LOW (ref 36.0–46.0)
Hemoglobin: 10.5 g/dL — ABNORMAL LOW (ref 12.0–15.0)
Lymphocytes Relative: 17.8 % (ref 12.0–46.0)
Lymphs Abs: 0.6 10*3/uL — ABNORMAL LOW (ref 0.7–4.0)
MCHC: 33.6 g/dL (ref 30.0–36.0)
MCV: 98.8 fl (ref 78.0–100.0)
Monocytes Absolute: 0.4 10*3/uL (ref 0.1–1.0)
Monocytes Relative: 10.5 % (ref 3.0–12.0)
Neutro Abs: 2.4 10*3/uL (ref 1.4–7.7)
Neutrophils Relative %: 70.4 % (ref 43.0–77.0)
Platelets: 143 10*3/uL — ABNORMAL LOW (ref 150.0–400.0)
RBC: 3.18 Mil/uL — ABNORMAL LOW (ref 3.87–5.11)
RDW: 12.6 % (ref 11.5–15.5)
WBC: 3.3 10*3/uL — ABNORMAL LOW (ref 4.0–10.5)

## 2022-02-05 LAB — TSH: TSH: 1.26 u[IU]/mL (ref 0.35–5.50)

## 2022-02-09 DIAGNOSIS — J449 Chronic obstructive pulmonary disease, unspecified: Secondary | ICD-10-CM | POA: Diagnosis not present

## 2022-02-10 ENCOUNTER — Other Ambulatory Visit: Payer: Self-pay | Admitting: Family Medicine

## 2022-02-10 ENCOUNTER — Other Ambulatory Visit: Payer: Self-pay

## 2022-02-10 DIAGNOSIS — D72810 Lymphocytopenia: Secondary | ICD-10-CM

## 2022-02-10 DIAGNOSIS — D649 Anemia, unspecified: Secondary | ICD-10-CM

## 2022-02-10 DIAGNOSIS — E870 Hyperosmolality and hypernatremia: Secondary | ICD-10-CM

## 2022-02-11 ENCOUNTER — Other Ambulatory Visit (INDEPENDENT_AMBULATORY_CARE_PROVIDER_SITE_OTHER): Payer: Medicare Other

## 2022-02-11 DIAGNOSIS — D649 Anemia, unspecified: Secondary | ICD-10-CM

## 2022-02-11 DIAGNOSIS — D72810 Lymphocytopenia: Secondary | ICD-10-CM

## 2022-02-11 DIAGNOSIS — E870 Hyperosmolality and hypernatremia: Secondary | ICD-10-CM | POA: Diagnosis not present

## 2022-02-11 LAB — COMPREHENSIVE METABOLIC PANEL
ALT: 15 U/L (ref 0–35)
AST: 20 U/L (ref 0–37)
Albumin: 4.2 g/dL (ref 3.5–5.2)
Alkaline Phosphatase: 47 U/L (ref 39–117)
BUN: 15 mg/dL (ref 6–23)
CO2: 46 mEq/L — ABNORMAL HIGH (ref 19–32)
Calcium: 10.2 mg/dL (ref 8.4–10.5)
Chloride: 93 mEq/L — ABNORMAL LOW (ref 96–112)
Creatinine, Ser: 0.4 mg/dL (ref 0.40–1.20)
GFR: 93.2 mL/min (ref >60.00)
Glucose, Bld: 122 mg/dL — ABNORMAL HIGH (ref 70–99)
Potassium: 4.9 mEq/L (ref 3.5–5.1)
Sodium: 147 mEq/L — ABNORMAL HIGH (ref 135–145)
Total Bilirubin: 0.6 mg/dL (ref 0.2–1.2)
Total Protein: 6.5 g/dL (ref 6.0–8.3)

## 2022-02-11 LAB — CBC WITH DIFFERENTIAL/PLATELET
Basophils Absolute: 0 10*3/uL (ref 0.0–0.1)
Basophils Relative: 0.5 % (ref 0.0–3.0)
Eosinophils Absolute: 0 10*3/uL (ref 0.0–0.7)
Eosinophils Relative: 0.5 % (ref 0.0–5.0)
HCT: 34.3 % — ABNORMAL LOW (ref 36.0–46.0)
Hemoglobin: 11.4 g/dL — ABNORMAL LOW (ref 12.0–15.0)
Lymphocytes Relative: 24.2 % (ref 12.0–46.0)
Lymphs Abs: 1.2 10*3/uL (ref 0.7–4.0)
MCHC: 33.3 g/dL (ref 30.0–36.0)
MCV: 98.8 fl (ref 78.0–100.0)
Monocytes Absolute: 0.5 10*3/uL (ref 0.1–1.0)
Monocytes Relative: 8.9 % (ref 3.0–12.0)
Neutro Abs: 3.4 10*3/uL (ref 1.4–7.7)
Neutrophils Relative %: 65.9 % (ref 43.0–77.0)
Platelets: 189 10*3/uL (ref 150.0–400.0)
RBC: 3.48 Mil/uL — ABNORMAL LOW (ref 3.87–5.11)
RDW: 13.2 % (ref 11.5–15.5)
WBC: 5.1 10*3/uL (ref 4.0–10.5)

## 2022-02-11 LAB — VITAMIN B12: Vitamin B-12: 438 pg/mL (ref 211–911)

## 2022-02-12 DIAGNOSIS — J449 Chronic obstructive pulmonary disease, unspecified: Secondary | ICD-10-CM | POA: Diagnosis not present

## 2022-02-16 ENCOUNTER — Other Ambulatory Visit: Payer: Medicare Other

## 2022-02-18 ENCOUNTER — Telehealth: Payer: Self-pay | Admitting: Family Medicine

## 2022-02-18 ENCOUNTER — Ambulatory Visit (INDEPENDENT_AMBULATORY_CARE_PROVIDER_SITE_OTHER): Payer: Medicare Other | Admitting: Family Medicine

## 2022-02-18 ENCOUNTER — Encounter: Payer: Self-pay | Admitting: Family Medicine

## 2022-02-18 VITALS — BP 166/64 | HR 80 | Temp 98.0°F | Ht 64.0 in | Wt 87.0 lb

## 2022-02-18 DIAGNOSIS — R600 Localized edema: Secondary | ICD-10-CM

## 2022-02-18 DIAGNOSIS — R413 Other amnesia: Secondary | ICD-10-CM | POA: Diagnosis not present

## 2022-02-18 DIAGNOSIS — I10 Essential (primary) hypertension: Secondary | ICD-10-CM

## 2022-02-18 DIAGNOSIS — D649 Anemia, unspecified: Secondary | ICD-10-CM | POA: Diagnosis not present

## 2022-02-18 DIAGNOSIS — E43 Unspecified severe protein-calorie malnutrition: Secondary | ICD-10-CM

## 2022-02-18 DIAGNOSIS — E870 Hyperosmolality and hypernatremia: Secondary | ICD-10-CM | POA: Diagnosis not present

## 2022-02-18 MED ORDER — BLOOD PRESSURE KIT: 1.0000 | PACK | Freq: Every day | 0 refills | Status: DC

## 2022-02-18 NOTE — Progress Notes (Signed)
Established Patient Office Visit   Subjective  Patient ID: Cathy Hensley, female    DOB: 22-Jan-1940  Age: 82 y.o. MRN: [id number]  Chief Complaint  Patient presents with   Follow-up  Patient accompanied by her daughter.  Patient is an 82 year old female with pmh sig for COPD, severe protein calorie malnutrition, pulmonary nodules, alpha 1 antitrypsin carrier, HTN, HTN LD who is seen for follow-up.  Patient comes in with her daughter who notes changes in patient's short-term memory.  Patient seems impulsive.  Patient completed 12th grade.  Also here to monitor weight, still 87 lbs.  Trying to encourage pt to eat and drink more.  Sodium recently elevated on labs and mild anemia noted.      ROS Negative unless stated above    Objective:     BP (!) 166/64 (BP Location: Left Arm, Patient Position: Sitting, Cuff Size: Normal)   Pulse 80   Temp 98 F (36.7 C) (Oral)   Ht 5\' 4"  (1.626 m)   Wt 87 lb (39.5 kg)   SpO2 97%   BMI 14.93 kg/m  BP Readings from Last 3 Encounters:  02/18/22 (!) 166/64  02/04/22 (!) 146/50  10/20/21 (!) 122/58   Wt Readings from Last 3 Encounters:  02/18/22 87 lb (39.5 kg)  02/04/22 87 lb 6.4 oz (39.6 kg)  01/28/22 87 lb (39.5 kg)      Physical Exam Constitutional:      General: She is awake.     Appearance: Normal appearance.     Interventions: Nasal cannula in place.     Comments: On continuous O2 via [place]  HENT:     Head: Normocephalic and atraumatic.     Right Ear: Tympanic membrane, ear canal and external ear normal.     Left Ear: Tympanic membrane, ear canal and external ear normal.     Nose: Nose normal.     Mouth/Throat:     Mouth: Mucous membranes are moist.     Pharynx: No oropharyngeal exudate or posterior oropharyngeal erythema.  Eyes:     General: No scleral icterus.    Extraocular Movements: Extraocular movements intact.     Conjunctiva/sclera: Conjunctivae normal.     Pupils: Pupils are equal, round, and reactive to light.   Neck:     Thyroid: No thyromegaly.  Cardiovascular:     Rate and Rhythm: Normal rate and regular rhythm.     Pulses: Normal pulses.     Heart sounds: Normal heart sounds. No murmur heard.    No friction rub.  Pulmonary:     Effort: Pulmonary effort is normal.     Breath sounds: Normal breath sounds. No wheezing, rhonchi or rales.  Abdominal:     General: Bowel sounds are normal.     Palpations: Abdomen is soft.     Tenderness: There is no abdominal tenderness.  Musculoskeletal:        General: No deformity. Normal range of motion.     Comments: Trace bilateral LE edema  Lymphadenopathy:     Cervical: No cervical adenopathy.  Skin:    General: Skin is warm and dry.     Findings: No lesion.  Neurological:     General: No focal deficit present.     Mental Status: She is alert and oriented to person, place, and time.  Psychiatric:        Mood and Affect: Mood normal.        Behavior: Behavior is cooperative.  Thought Content: Thought content normal.      No results found for any visits on 02/18/22.    Assessment & Plan:  Memory change -MoCA done this visit.  Scored 21/30 with deficits noted in attention-list of letters, language, naming, and delayed recall. -Discussed neuropsych testing.  Patient declines at this time. -Continue to monitor  Hypernatremia -Sodium 146 on 02/04/2022 and 147 on 02/11/2022. -Discussed ways to improve sodium level including increasing hydration with water. -Continue to monitor  Anemia, unspecified type -Improving -Hemoglobin now 11.4 -Discussed iron rich diet to help improve level.  Bilateral leg edema -Trace edema in LEs. -Likely 2/2 malnutrition, sedentary lifestyle -Discussed chair exercises, increasing p.o. intake of protein and fluids  Essential hypertension -Elevated this visit.  Typically better controlled. -Will have patient monitor BP at home.  Given Rx for blood pressure cuff. -Continue current medications including  Norvasc 5 mg daily -     Blood Pressure; 1 Device by Does not apply route daily.  Dispense: 1 kit; Refill: 0  Severe protein-calorie malnutrition (HCC) -Increase p.o. intake -Consider protein shakes or protein powder -Continue to monitor weight   Return in about 5 weeks (around 03/25/2022), or if symptoms worsen or fail to improve.   Billie Ruddy, MD

## 2022-02-18 NOTE — Telephone Encounter (Signed)
Patient wants to reschedule AWV appt.  Call daughter Herbie Drape.

## 2022-03-03 DIAGNOSIS — J449 Chronic obstructive pulmonary disease, unspecified: Secondary | ICD-10-CM | POA: Diagnosis not present

## 2022-03-10 DIAGNOSIS — J449 Chronic obstructive pulmonary disease, unspecified: Secondary | ICD-10-CM | POA: Diagnosis not present

## 2022-03-13 DIAGNOSIS — J449 Chronic obstructive pulmonary disease, unspecified: Secondary | ICD-10-CM | POA: Diagnosis not present

## 2022-03-14 IMAGING — DX DG CHEST 2V
2 series · 2 of 2 positions shown · non-contrast
Comparison: 02/10/2021

CLINICAL DATA: Congestion

EXAM:
CHEST - 2 VIEW

[chest pa]
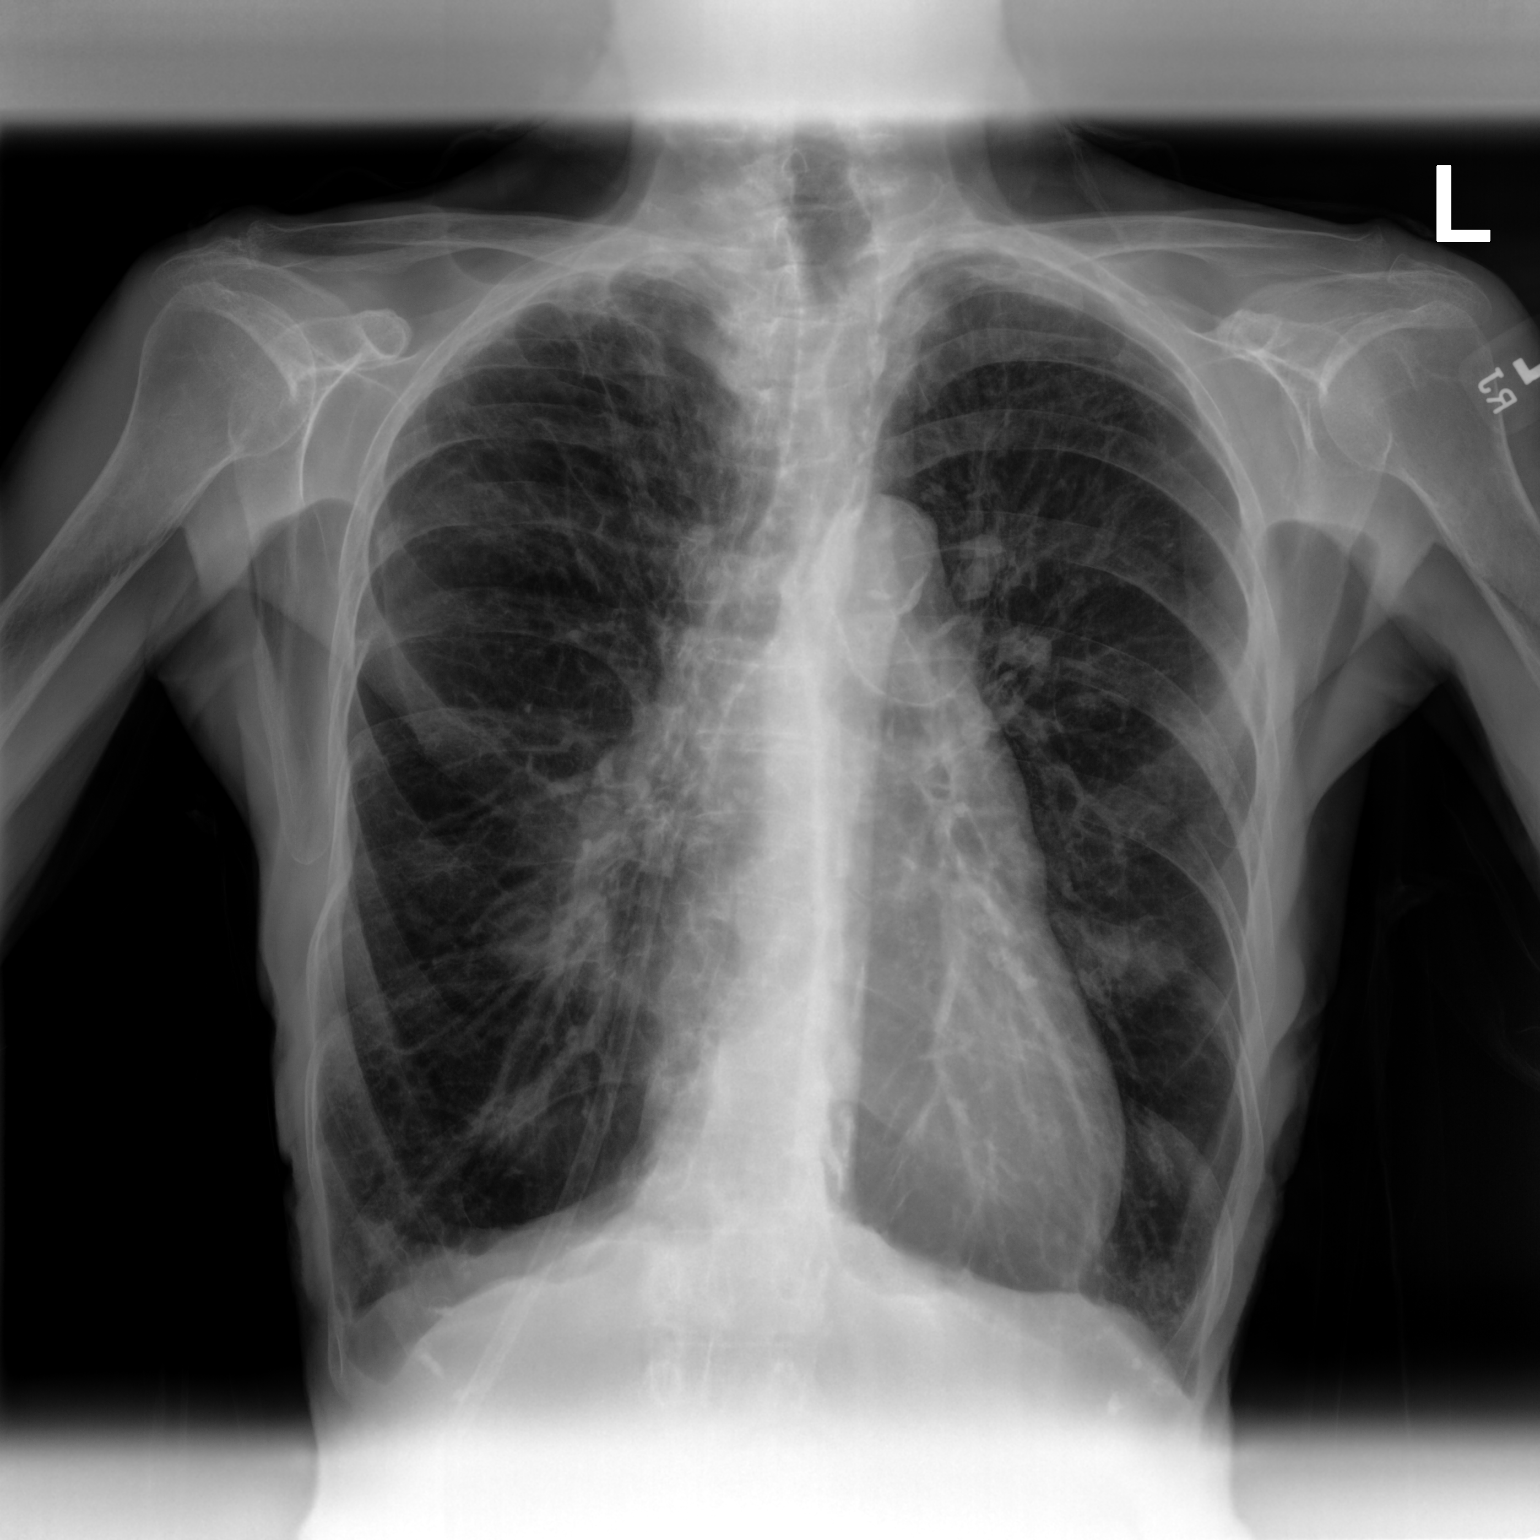

[chest lat]
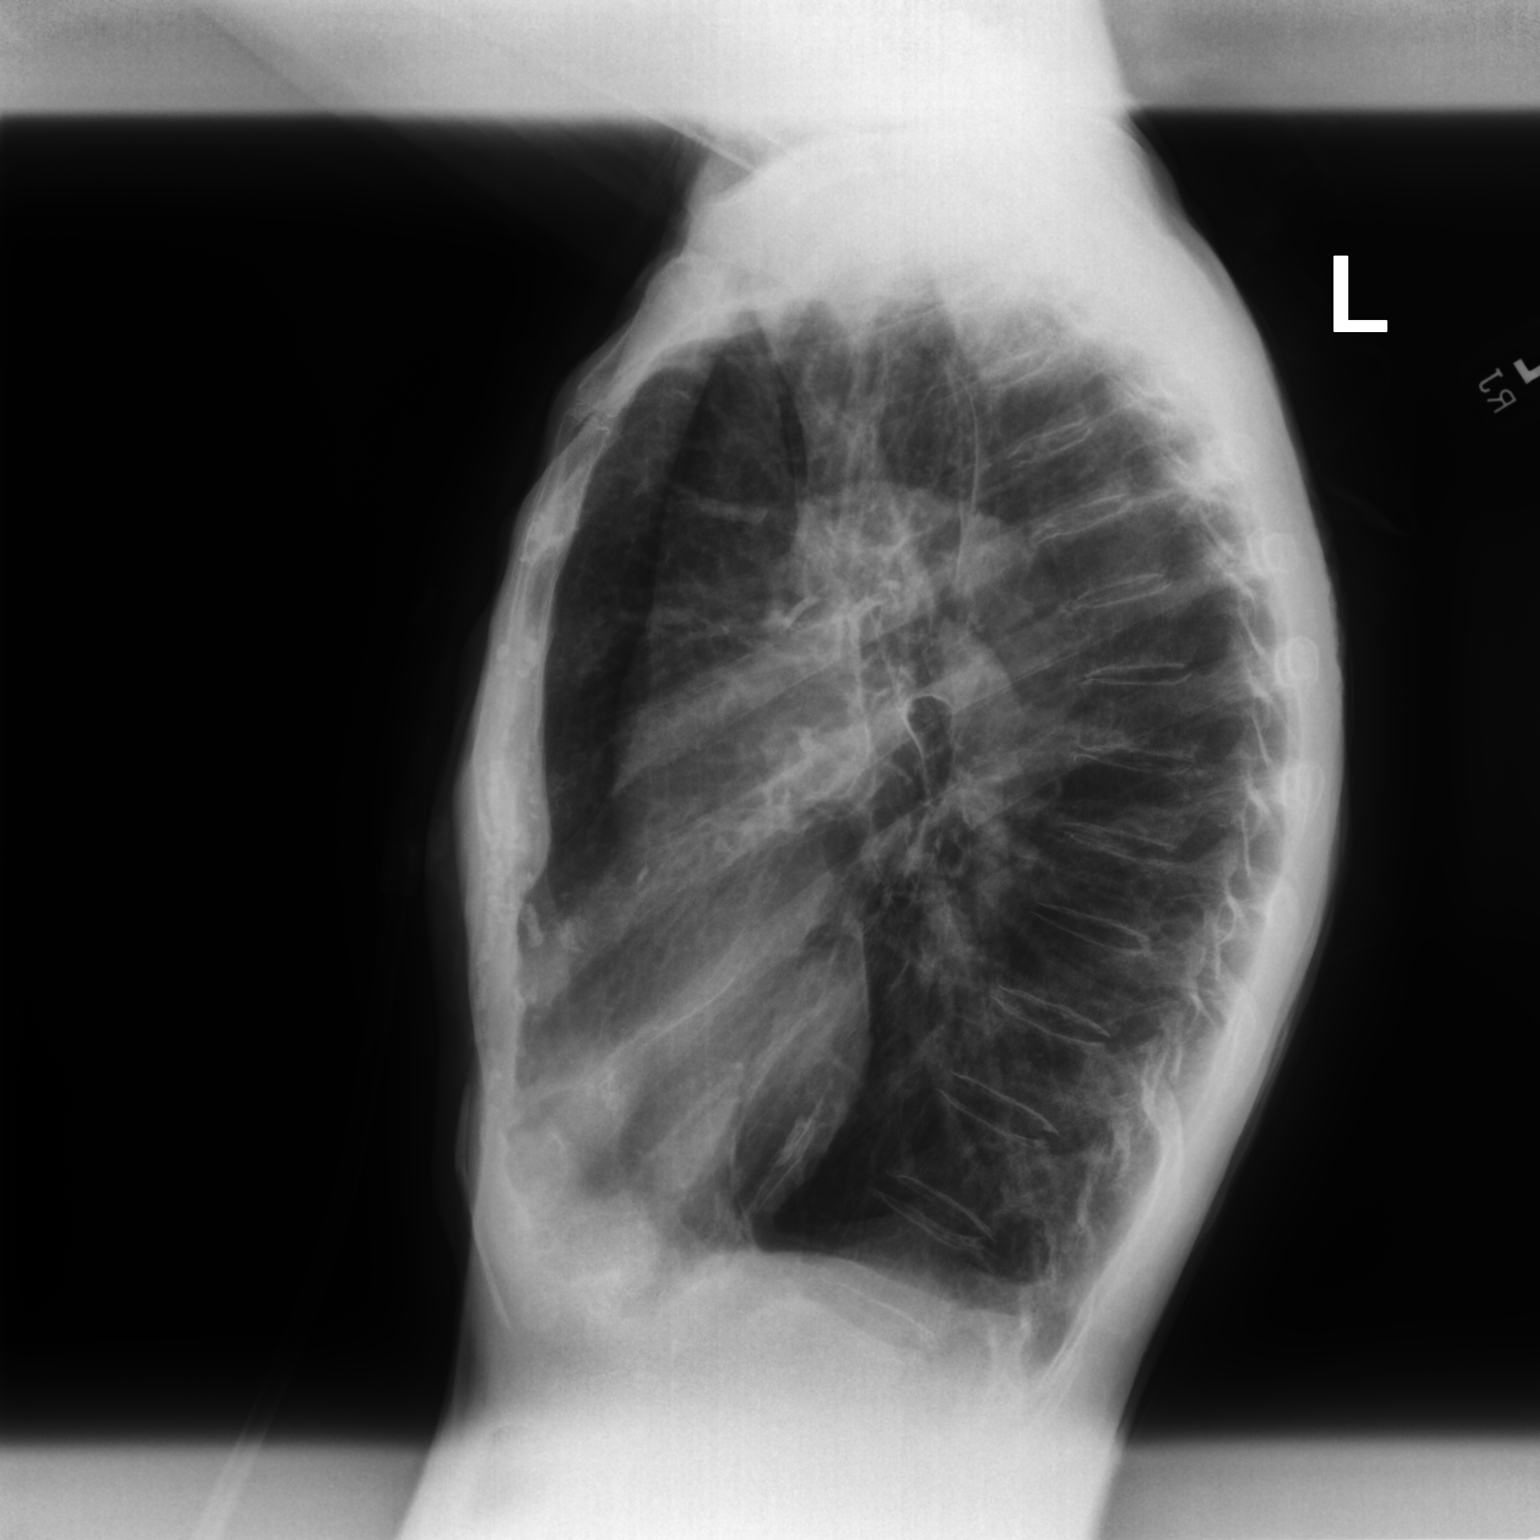

[2 of 2 positions shown; findings below may reference images not displayed]

FINDINGS: Frontal and lateral views of the chest demonstrate a stable cardiac
silhouette. Continued changes of hyperinflation and emphysema.
Stable 11 mm nodular density at the right lateral costophrenic
angle. This may reflect pleural scarring, though given persistence
since prior study follow-up chest CT could be considered. No other
airspace disease, effusion, or pneumothorax. No acute bony
abnormalities.
IMPRESSION: 1. Likely nodular pleural thickening at the right lateral
costophrenic angle. However, given persistence since prior study,
follow-up CT could be considered.
2. Emphysema.

## 2022-03-16 ENCOUNTER — Other Ambulatory Visit: Payer: Self-pay | Admitting: Internal Medicine

## 2022-03-25 ENCOUNTER — Ambulatory Visit (INDEPENDENT_AMBULATORY_CARE_PROVIDER_SITE_OTHER): Payer: Medicare Other | Admitting: Family Medicine

## 2022-03-25 ENCOUNTER — Encounter: Payer: Self-pay | Admitting: Family Medicine

## 2022-03-25 VITALS — BP 164/60 | HR 94 | Temp 98.5°F | Ht 64.0 in | Wt 88.8 lb

## 2022-03-25 DIAGNOSIS — J449 Chronic obstructive pulmonary disease, unspecified: Secondary | ICD-10-CM | POA: Diagnosis not present

## 2022-03-25 DIAGNOSIS — J9612 Chronic respiratory failure with hypercapnia: Secondary | ICD-10-CM

## 2022-03-25 DIAGNOSIS — E43 Unspecified severe protein-calorie malnutrition: Secondary | ICD-10-CM

## 2022-03-25 DIAGNOSIS — I10 Essential (primary) hypertension: Secondary | ICD-10-CM | POA: Diagnosis not present

## 2022-03-25 DIAGNOSIS — J9611 Chronic respiratory failure with hypoxia: Secondary | ICD-10-CM

## 2022-03-25 NOTE — Progress Notes (Signed)
1  Established Patient Office Visit   Subjective  Patient ID: Cathy Hensley, female    DOB: July 18, 1940  Age: 82 y.o. 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  Pt gained 1 lbs since last OFV.  Eating a little more.  Has a HH aide, Brenda, 5 day/ wk 10 am -4 pm which is going well.  Pt considering a generator for the house so if the power goes out her O2 compressor will still work.        ROS Negative unless stated above    Objective:     BP (!) 164/60 (BP Location: Left Arm, Patient Position: Sitting, Cuff Size: Normal)   Pulse 94   Temp 98.5 F (36.9 C) (Oral)   Ht 5\' 4"  (1.626 m)   Wt 88 lb 12.8 oz (40.3 kg)   SpO2 94%   BMI 15.24 kg/m    Physical Exam Constitutional:      General: She is not in acute distress.    Appearance: Normal appearance. She is not ill-appearing.  HENT:     Head: Normocephalic and atraumatic.     Nose: Nose normal.     Mouth/Throat:     Mouth: Mucous membranes are moist.  Eyes:     Extraocular Movements: Extraocular movements intact.     Conjunctiva/sclera: Conjunctivae normal.     Pupils: Pupils are equal, round, and reactive to light.  Cardiovascular:     Rate and Rhythm: Normal rate and regular rhythm.     Heart sounds: Normal heart sounds. No murmur heard.    No gallop.  Pulmonary:     Effort: Pulmonary effort is normal. No respiratory distress.     Breath sounds: Normal breath sounds. No wheezing, rhonchi or rales.     Comments: On 3L O2 via [place]. Abdominal:     General: Bowel sounds are normal.     Palpations: Abdomen is soft.  Skin:    General: Skin is warm and dry.  Neurological:     Mental Status: She is alert and oriented to person, place, and time.      No results  found for any visits on 03/25/22.    Assessment & Plan:  COPD mixed type (HCC)  Essential hypertension  Chronic respiratory failure with hypoxia and hypercapnia (HCC)  Severe protein-calorie malnutrition (HCC)  Pt seems to be improving since aide started.  Congratulated on wt gain.  Continue encouraging meals, snacks, and supplemental drinks.  BP elevated, consider increasing Norvasc for readings consistently > 150/90.  Continue O2 2.5-3L.  Continue inhalers and f/u with Pulm.  Return if symptoms worsen or fail to improve.   Deeann Saint, MD

## 2022-03-29 DIAGNOSIS — J449 Chronic obstructive pulmonary disease, unspecified: Secondary | ICD-10-CM | POA: Diagnosis not present

## 2022-04-10 DIAGNOSIS — J449 Chronic obstructive pulmonary disease, unspecified: Secondary | ICD-10-CM | POA: Diagnosis not present

## 2022-04-13 DIAGNOSIS — J449 Chronic obstructive pulmonary disease, unspecified: Secondary | ICD-10-CM | POA: Diagnosis not present

## 2022-04-22 ENCOUNTER — Ambulatory Visit: Payer: Medicare Other | Admitting: Internal Medicine

## 2022-04-23 DIAGNOSIS — J449 Chronic obstructive pulmonary disease, unspecified: Secondary | ICD-10-CM | POA: Diagnosis not present

## 2022-05-02 NOTE — Progress Notes (Unsigned)
\\  Subjective:   Patient ID: Cathy Hensley, female    DOB: [birth year]  MRN: [id number]     Brief patient profile:  4 yowf  MZ quit smoking  2002  severe COPD: Prescribed Pulmicort, Brovana, & Spiriva.  Chronic hypoxic respiratory failure: on  2.5 lpm home/ 4 pulsed with ambulation  Chronic hypercarbic respiratory failure: Not currently on noninvasive positive pressure ventilation. Secondary to COPD. Chronic allergic rhinitis: Prescribed Singulair.      12/06/2016 acute extended ov/Cathy Hensley re:  GOLD IV/ 02 dep on brovana/bud/ spiriva  Chief Complaint  Patient presents with   Acute Visit    Increased SOB and cough for the past 3 days. She states sputum is hard to produce, but when she does it's grey and green.   on 2.5 lpm home and 4lpm pulsed with baseline = MMRC3 = can't walk 100 yards even at a slow pace at a flat grade s stopping due to sob   Slept ok in recliner night before ov but usually able to lie flat in bed s am HA  Acutely worse x 72 with congested cough thick and slt grey/ green worse in am  Comfortable at rest/ no needing extra saba  rec zpak  Prednisone 10 mg take  4 each am x 2 days,   2 each am x 2 days,  1 each am x 2 days and stop  - add:  F/u with ov in one week - if worse in meantime rx levaquin 750 daily x 5 days   10/20/2021  f/u ov/Cathy Hensley re: GOLD 4 copd/ 02 dep  maint on triple rx   Chief Complaint  Patient presents with   Follow-up    PT states no changes from last visit.   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 She is using her albuterol inhaler once daily on average.   Dyspnea:  made it one lap around daughter's house on 02 bu not checking sats with ex  Cough: none  Sleeping: flat bed 2 pillows  SABA use: once a day  02: 2.5 lpm  New swelling both LE's on amlopidine      No obvious day to day or daytime variability or assoc excess/ purulent sputum or mucus plugs or hemoptysis or cp or chest tightness, subjective wheeze or overt sinus or hb symptoms.   Sleeping as above without nocturnal  or early am exacerbation  of respiratory  c/o's or need for noct saba. Also denies any obvious fluctuation of symptoms with weather or environmental changes or other aggravating or alleviating factors except as outlined above   No unusual exposure hx or h/o childhood pna/ asthma or knowledge of premature birth.  Current Allergies, Complete Past Medical History, Past Surgical History, Family History, and Social  History were reviewed in [facility] electronic medical record.  ROS  The following are not active complaints unless bolded Hoarseness, sore throat, dysphagia, dental problems, itching, sneezing,  nasal congestion or discharge of excess mucus or purulent secretions, ear ache,   fever, chills, sweats, unintended wt loss or wt gain, classically pleuritic or exertional cp,  orthopnea pnd or arm/hand swelling  or leg swelling, presyncope, palpitations, abdominal pain, anorexia, nausea, vomiting, diarrhea  or change in bowel habits or change in bladder habits, change in stools or change in urine, dysuria, hematuria,  rash, arthralgias, visual complaints, headache, numbness, weakness or ataxia or problems with walking or coordination,  change in mood or  memory.        Current Meds  Medication Sig   acetaminophen (TYLENOL) 500 MG tablet Take 500 mg by mouth every 6 (six) hours as needed.    albuterol (VENTOLIN HFA) 108 (90 Base) MCG/ACT inhaler INHALE 2 PUFFS INTO THE LUNGS EVERY 6 HOURS AS NEEDED FOR WHEEZING OR SHORTNESS OF BREATH   amLODipine (NORVASC) 5 MG tablet TAKE 1 TABLET(5 MG) BY MOUTH DAILY   arformoterol (BROVANA) 15 MCG/2ML NEBU USE 1 VIAL  IN  NEBULIZER TWICE  DAILY - morning and evening   aspirin 81 MG chewable tablet Chew 1 tablet (81 mg total) by mouth daily.   Blood Pressure KIT 1 Device by Does not apply route daily.   budesonide (PULMICORT) 0.25 MG/2ML nebulizer solution Use 1 vial in nebulizer twice daily.  Once mouth after treatment.   dextromethorphan-guaiFENesin (MUCINEX DM) 30-600 MG 12hr tablet Take 1 tablet by mouth 2 (two) times daily.   montelukast (SINGULAIR) 10 MG tablet TAKE 1 TABLET(10 MG) BY MOUTH AT BEDTIME   Multiple Vitamins-Minerals (CENTRUM SILVER PO) Take 1 tablet by mouth daily.   OXYGEN 2.5 lpm 24/7 (she uses 4lpm pulsed when on POC)   Respiratory Therapy Supplies (FLUTTER) DEVI 1 Device by Does not apply route as needed.   simvastatin (ZOCOR) 20 MG tablet TAKE 1 TABLET(20 MG) BY MOUTH AT BEDTIME   SPIRIVA RESPIMAT 2.5 MCG/ACT AERS INHALE 2 PUFFS INTO THE LUNGS DAILY                Objective:   Physical Exam   wts  05/03/2022        89  10/20/2021      87 approx   07/03/2021        87  01/09/2018          106  09/06/2017         109   12/15/2016    108   12/06/16 112 lb (50.8 kg)  09/28/16 113 lb (51.3 kg)  08/27/16 112 lb (50.8 kg)   Vital signs reviewed  05/03/2022  - Note at rest 02 sats  96% on 2.5 lpm    General appearance:    very frail thin w/c bound wf nad     HEENT :  Oropharynx  clear     NECK :  without JVD/Nodes/TM/ nl carotid upstrokes bilaterally   LUNGS: no acc muscle use,  Mod barrel  contour chest wall with bilateral  Distant bs s audible wheeze and  without cough on insp or exp maneuvers and mod  Hyperresonant  to  percussion bilaterally     CV:  RRR  no s3 or murmur or increase in P2, and 2+ pitting  both LE with venous stasis changes as well   ABD:  soft and nontender  MS:   Ext warm without deformities or   obvious joint restrictions , calf tenderness, cyanosis or clubbing   NEURO:  alert, approp, nl sensorium with  no motor or cerebellar deficits apparent.                    PFT 01/22/15: FVC 1.58 L (53%) FEV1 0.58 L (26%) FEV1/FVC 0.37 FEF 25-75 0.23 L (13%) no bronchodilator response 11/05/14: FVC 1.52 L (51%) FEV1 0.49 L (22%) FEV1/FVC 0.32 FEF 25-75 0.20 L (11%) no bronchodilator response ERV 87% DLCO uncorrected 27%              Assessment & Plan:

## 2022-05-03 ENCOUNTER — Ambulatory Visit: Payer: Medicare Other | Admitting: Internal Medicine

## 2022-05-03 ENCOUNTER — Encounter: Payer: Self-pay | Admitting: Internal Medicine

## 2022-05-03 ENCOUNTER — Telehealth: Payer: Self-pay | Admitting: Family Medicine

## 2022-05-03 VITALS — BP 152/76 | Ht 64.0 in | Wt 88.8 lb

## 2022-05-03 DIAGNOSIS — J9612 Chronic respiratory failure with hypercapnia: Secondary | ICD-10-CM

## 2022-05-03 DIAGNOSIS — J449 Chronic obstructive pulmonary disease, unspecified: Secondary | ICD-10-CM

## 2022-05-03 DIAGNOSIS — R609 Edema, unspecified: Secondary | ICD-10-CM | POA: Diagnosis not present

## 2022-05-03 DIAGNOSIS — J9611 Chronic respiratory failure with hypoxia: Secondary | ICD-10-CM | POA: Diagnosis not present

## 2022-05-03 NOTE — Telephone Encounter (Signed)
Pt daughter Cathy Hensley is calling and her mom saw dr Rolin Barry today and he wrote in her chart that he believes she should come off her bp med amlodipine due to her feet are swelling. Please advise

## 2022-05-03 NOTE — Patient Instructions (Addendum)
You may need to come off your amlopidine and use a mild diuretic to improve your leg swelling but I will defer this to your PCP (Dr Salomon Fick)   No change in your respiratory medications  for now   Make sure you check your oxygen saturation  AT  your highest level of activity (not after you stop)   to be sure it stays over 90% and adjust  02 flow upward to maintain this level if needed but remember to turn it back to previous settings when you stop (to conserve your supply).    Please schedule a follow up visit in 6 months but call sooner if needed

## 2022-05-04 ENCOUNTER — Encounter: Payer: Self-pay | Admitting: Family Medicine

## 2022-05-04 ENCOUNTER — Encounter: Payer: Self-pay | Admitting: Internal Medicine

## 2022-05-04 DIAGNOSIS — R609 Edema, unspecified: Secondary | ICD-10-CM | POA: Insufficient documentation

## 2022-05-04 NOTE — Assessment & Plan Note (Signed)
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) PFT 01/22/15: FVC 1.58 L (53%) FEV1 0.58 L (26%) FEV1/FVC 0.37 FEF 25-75 0.23 L (13%) no bronchodilator response 11/05/14: FVC 1.52 L (51%) FEV1 0.49 L (22%) FEV1/FVC 0.32 FEF 25-75 0.20 L (11%) no bronchodilator response ERV 87% DLCO uncorrected 27% - trained on flutter use 12/15/2016  - 07/03/2021  After extensive coaching inhaler device,  effectiveness =    75% from baseline 25% with SMI > continue spiriva plus neb laba / ics   Group D (now reclassified as E) in terms of symptom/risk and laba/lama/ICS  therefore appropriate rx at this point >>>  continue brovana/bud and spiriva and approp saba

## 2022-05-04 NOTE — Assessment & Plan Note (Addendum)
Chronic but worse at ov 05/03/2022 on amlodipine 5 mg daily with assoc venous stasis   May well have component of cor pulmonale but this would be a moot issue in terms of w/u and rec rx with diuretics which may need to include low dose aldactone, maybe consider d/c amlodipine but defer to PCP  F/u pulmonary clinic q 6 m, sooner if needed     Each maintenance medication was reviewed in detail including emphasizing most importantly the difference between maintenance and prns and under what circumstances the prns are to be triggered using an action plan format where appropriate.  Total time for H and P, chart review, counseling, reviewing smi/ neb/ 02  device(s) and generating customized AVS unique to this office visit / same day charting = 23 min

## 2022-05-04 NOTE — Assessment & Plan Note (Signed)
HCO3  09/28/16 = 35  HCO3  06/03/21 = 40    Well compensated at present > rec continue 2.5 lpm hs as not having AM symptoms to suggest worsening hypercarbia  but advised:  Make sure you check your oxygen saturation  AT  your highest level of activity (not after you stop)   to be sure it stays over 90% and adjust  02 flow upward to maintain this level if needed but remember to turn it back to previous settings when you stop (to conserve your supply).

## 2022-05-05 NOTE — Telephone Encounter (Signed)
That's fine.  If the swelling is from the bp med Amlodipine it will resolve once the medication is stopped.  Will likely need to adjust other meds/add a new bp med to help control bp.

## 2022-05-07 NOTE — Telephone Encounter (Signed)
Patient's daughter informed of the message below and verbalized understanding  

## 2022-05-10 DIAGNOSIS — J449 Chronic obstructive pulmonary disease, unspecified: Secondary | ICD-10-CM | POA: Diagnosis not present

## 2022-05-13 DIAGNOSIS — J449 Chronic obstructive pulmonary disease, unspecified: Secondary | ICD-10-CM | POA: Diagnosis not present

## 2022-05-24 ENCOUNTER — Other Ambulatory Visit: Payer: Self-pay

## 2022-05-24 ENCOUNTER — Emergency Department (HOSPITAL_COMMUNITY): Payer: Medicare Other

## 2022-05-24 ENCOUNTER — Encounter (HOSPITAL_COMMUNITY): Payer: Self-pay

## 2022-05-24 ENCOUNTER — Inpatient Hospital Stay (HOSPITAL_COMMUNITY)
Admission: EM | Admit: 2022-05-24 | Discharge: 2022-05-28 | DRG: 689 | Disposition: A | Discharge status: Home Health | Payer: Medicare Other | Attending: Internal Medicine | Admitting: Internal Medicine

## 2022-05-24 DIAGNOSIS — N390 Urinary tract infection, site not specified: Principal | ICD-10-CM | POA: Diagnosis present

## 2022-05-24 DIAGNOSIS — Z780 Asymptomatic menopausal state: Secondary | ICD-10-CM

## 2022-05-24 DIAGNOSIS — Z7409 Other reduced mobility: Secondary | ICD-10-CM | POA: Diagnosis present

## 2022-05-24 DIAGNOSIS — Z66 Do not resuscitate: Secondary | ICD-10-CM | POA: Diagnosis not present

## 2022-05-24 DIAGNOSIS — Z9981 Dependence on supplemental oxygen: Secondary | ICD-10-CM

## 2022-05-24 DIAGNOSIS — Z79899 Other long term (current) drug therapy: Secondary | ICD-10-CM

## 2022-05-24 DIAGNOSIS — F0392 Unspecified dementia, unspecified severity, with psychotic disturbance: Secondary | ICD-10-CM | POA: Diagnosis present

## 2022-05-24 DIAGNOSIS — L89151 Pressure ulcer of sacral region, stage 1: Secondary | ICD-10-CM | POA: Diagnosis not present

## 2022-05-24 DIAGNOSIS — I10 Essential (primary) hypertension: Secondary | ICD-10-CM | POA: Diagnosis not present

## 2022-05-24 DIAGNOSIS — E8801 Alpha-1-antitrypsin deficiency: Secondary | ICD-10-CM | POA: Diagnosis present

## 2022-05-24 DIAGNOSIS — G9341 Metabolic encephalopathy: Secondary | ICD-10-CM | POA: Diagnosis present

## 2022-05-24 DIAGNOSIS — E8770 Fluid overload, unspecified: Secondary | ICD-10-CM | POA: Diagnosis present

## 2022-05-24 DIAGNOSIS — R7989 Other specified abnormal findings of blood chemistry: Secondary | ICD-10-CM | POA: Diagnosis not present

## 2022-05-24 DIAGNOSIS — J9612 Chronic respiratory failure with hypercapnia: Secondary | ICD-10-CM | POA: Diagnosis not present

## 2022-05-24 DIAGNOSIS — R410 Disorientation, unspecified: Secondary | ICD-10-CM | POA: Diagnosis not present

## 2022-05-24 DIAGNOSIS — Z7951 Long term (current) use of inhaled steroids: Secondary | ICD-10-CM | POA: Diagnosis not present

## 2022-05-24 DIAGNOSIS — Z825 Family history of asthma and other chronic lower respiratory diseases: Secondary | ICD-10-CM

## 2022-05-24 DIAGNOSIS — R54 Age-related physical debility: Secondary | ICD-10-CM | POA: Diagnosis present

## 2022-05-24 DIAGNOSIS — J9611 Chronic respiratory failure with hypoxia: Secondary | ICD-10-CM | POA: Diagnosis present

## 2022-05-24 DIAGNOSIS — Z87891 Personal history of nicotine dependence: Secondary | ICD-10-CM | POA: Diagnosis not present

## 2022-05-24 DIAGNOSIS — R0602 Shortness of breath: Secondary | ICD-10-CM | POA: Diagnosis not present

## 2022-05-24 DIAGNOSIS — Z7189 Other specified counseling: Secondary | ICD-10-CM

## 2022-05-24 DIAGNOSIS — E785 Hyperlipidemia, unspecified: Secondary | ICD-10-CM | POA: Diagnosis present

## 2022-05-24 DIAGNOSIS — E782 Mixed hyperlipidemia: Secondary | ICD-10-CM

## 2022-05-24 DIAGNOSIS — J449 Chronic obstructive pulmonary disease, unspecified: Secondary | ICD-10-CM | POA: Diagnosis not present

## 2022-05-24 DIAGNOSIS — R4182 Altered mental status, unspecified: Secondary | ICD-10-CM

## 2022-05-24 DIAGNOSIS — F05 Delirium due to known physiological condition: Secondary | ICD-10-CM | POA: Diagnosis present

## 2022-05-24 DIAGNOSIS — Z7982 Long term (current) use of aspirin: Secondary | ICD-10-CM | POA: Diagnosis not present

## 2022-05-24 DIAGNOSIS — E871 Hypo-osmolality and hyponatremia: Secondary | ICD-10-CM | POA: Diagnosis present

## 2022-05-24 DIAGNOSIS — Z515 Encounter for palliative care: Secondary | ICD-10-CM | POA: Diagnosis not present

## 2022-05-24 DIAGNOSIS — J441 Chronic obstructive pulmonary disease with (acute) exacerbation: Secondary | ICD-10-CM | POA: Diagnosis not present

## 2022-05-24 LAB — COMPREHENSIVE METABOLIC PANEL
ALT: 18 U/L (ref 0–44)
AST: 21 U/L (ref 15–41)
Albumin: 3.5 g/dL (ref 3.5–5.0)
Alkaline Phosphatase: 45 U/L (ref 38–126)
BUN: 18 mg/dL (ref 8–23)
CO2: >45 mmol/L — ABNORMAL HIGH (ref 22–32)
Calcium: 9.5 mg/dL (ref 8.9–10.3)
Chloride: 90 mmol/L — ABNORMAL LOW (ref 98–111)
Creatinine, Ser: 0.51 mg/dL (ref 0.44–1.00)
GFR, Estimated: >60 mL/min (ref >60)
Glucose, Bld: 82 mg/dL (ref 70–99)
Potassium: 4.3 mmol/L (ref 3.5–5.1)
Sodium: 147 mmol/L — ABNORMAL HIGH (ref 135–145)
Total Bilirubin: 0.6 mg/dL (ref 0.3–1.2)
Total Protein: 5.8 g/dL — ABNORMAL LOW (ref 6.5–8.1)

## 2022-05-24 LAB — CBC
HCT: 33.2 % — ABNORMAL LOW (ref 36.0–46.0)
Hemoglobin: 9.7 g/dL — ABNORMAL LOW (ref 12.0–15.0)
MCH: 31.7 pg (ref 26.0–34.0)
MCHC: 29.2 g/dL — ABNORMAL LOW (ref 30.0–36.0)
MCV: 108.5 fL — ABNORMAL HIGH (ref 80.0–100.0)
Platelets: 120 10*3/uL — ABNORMAL LOW (ref 150–400)
RBC: 3.06 MIL/uL — ABNORMAL LOW (ref 3.87–5.11)
RDW: 13 % (ref 11.5–15.5)
WBC: 4.4 10*3/uL (ref 4.0–10.5)
nRBC: 0 % (ref 0.0–0.2)

## 2022-05-24 LAB — URINALYSIS, ROUTINE W REFLEX MICROSCOPIC
Bilirubin Urine: NEGATIVE
Glucose, UA: NEGATIVE mg/dL
Ketones, ur: 5 mg/dL — AB
Leukocytes,Ua: NEGATIVE
Nitrite: NEGATIVE
Protein, ur: 30 mg/dL — AB
Specific Gravity, Urine: 1.015 (ref 1.005–1.030)
pH: 5 (ref 5.0–8.0)

## 2022-05-24 LAB — I-STAT VENOUS BLOOD GAS, ED
Acid-Base Excess: 26 mmol/L — ABNORMAL HIGH (ref 0.0–2.0)
Bicarbonate: 54.6 mmol/L — ABNORMAL HIGH (ref 20.0–28.0)
Calcium, Ion: 1.1 mmol/L — ABNORMAL LOW (ref 1.15–1.40)
HCT: 31 % — ABNORMAL LOW (ref 36.0–46.0)
Hemoglobin: 10.5 g/dL — ABNORMAL LOW (ref 12.0–15.0)
O2 Saturation: 97 %
Potassium: 4.4 mmol/L (ref 3.5–5.1)
Sodium: 141 mmol/L (ref 135–145)
TCO2: >50 mmol/L — ABNORMAL HIGH (ref 22–32)
pCO2, Ven: 79.8 mmHg (ref 44–60)
pH, Ven: 7.443 — ABNORMAL HIGH (ref 7.25–7.43)
pO2, Ven: 92 mmHg — ABNORMAL HIGH (ref 32–45)

## 2022-05-24 LAB — CBG MONITORING, ED
Glucose-Capillary: 62 mg/dL — ABNORMAL LOW (ref 70–99)
Glucose-Capillary: 99 mg/dL (ref 70–99)

## 2022-05-24 LAB — TROPONIN I (HIGH SENSITIVITY)
Troponin I (High Sensitivity): 24 ng/L — ABNORMAL HIGH (ref <18)
Troponin I (High Sensitivity): 27 ng/L — ABNORMAL HIGH (ref <18)

## 2022-05-24 LAB — BRAIN NATRIURETIC PEPTIDE: B Natriuretic Peptide: 84.1 pg/mL (ref 0.0–100.0)

## 2022-05-24 MED ORDER — ASPIRIN 81 MG PO CHEW
81.0000 mg | CHEWABLE_TABLET | Freq: Every day | ORAL | Status: DC
  Administered 2022-05-24 – 2022-05-28 (×5): 81 mg via ORAL
  Filled 2022-05-24 (×5): qty 1

## 2022-05-24 MED ORDER — ARFORMOTEROL TARTRATE 15 MCG/2ML IN NEBU
15.0000 ug | INHALATION_SOLUTION | Freq: Two times a day (BID) | RESPIRATORY_TRACT | Status: DC
  Administered 2022-05-24 – 2022-05-28 (×8): 15 ug via RESPIRATORY_TRACT
  Filled 2022-05-24 (×9): qty 2

## 2022-05-24 MED ORDER — MELATONIN 3 MG PO TABS
3.0000 mg | ORAL_TABLET | Freq: Every day | ORAL | Status: DC
  Administered 2022-05-24 – 2022-05-27 (×4): 3 mg via ORAL
  Filled 2022-05-24 (×4): qty 1

## 2022-05-24 MED ORDER — ACETAMINOPHEN 650 MG RE SUPP: 650.0000 mg | Freq: Four times a day (QID) | RECTAL | Status: DC | PRN

## 2022-05-24 MED ORDER — SIMVASTATIN 20 MG PO TABS
20.0000 mg | ORAL_TABLET | Freq: Every day | ORAL | Status: DC
  Administered 2022-05-26 – 2022-05-27 (×2): 20 mg via ORAL
  Filled 2022-05-24 (×3): qty 1

## 2022-05-24 MED ORDER — ACETAMINOPHEN 325 MG PO TABS: 650.0000 mg | ORAL_TABLET | Freq: Four times a day (QID) | ORAL | Status: DC | PRN

## 2022-05-24 MED ORDER — SODIUM CHLORIDE 0.9 % IV SOLN
1.0000 g | Freq: Two times a day (BID) | INTRAVENOUS | Status: AC
  Administered 2022-05-24 – 2022-05-27 (×6): 1 g via INTRAVENOUS
  Filled 2022-05-24 (×6): qty 10

## 2022-05-24 MED ORDER — TIOTROPIUM BROMIDE MONOHYDRATE 2.5 MCG/ACT IN AERS: 2.0000 | INHALATION_SPRAY | Freq: Every day | RESPIRATORY_TRACT | Status: DC

## 2022-05-24 MED ORDER — DM-GUAIFENESIN ER 30-600 MG PO TB12
1.0000 | ORAL_TABLET | Freq: Two times a day (BID) | ORAL | Status: DC
  Administered 2022-05-25 – 2022-05-28 (×7): 1 via ORAL
  Filled 2022-05-24 (×9): qty 1

## 2022-05-24 MED ORDER — BUDESONIDE 0.25 MG/2ML IN SUSP
0.2500 mg | Freq: Two times a day (BID) | RESPIRATORY_TRACT | Status: DC
  Administered 2022-05-24 – 2022-05-28 (×8): 0.25 mg via RESPIRATORY_TRACT
  Filled 2022-05-24 (×9): qty 2

## 2022-05-24 MED ORDER — ALBUTEROL SULFATE (2.5 MG/3ML) 0.083% IN NEBU: 2.5000 mg | INHALATION_SOLUTION | Freq: Four times a day (QID) | RESPIRATORY_TRACT | Status: DC | PRN

## 2022-05-24 MED ORDER — AMLODIPINE BESYLATE 5 MG PO TABS: 5.0000 mg | ORAL_TABLET | Freq: Every day | ORAL | Status: DC

## 2022-05-24 MED ORDER — SENNA 8.6 MG PO TABS
1.0000 | ORAL_TABLET | Freq: Two times a day (BID) | ORAL | Status: DC
  Administered 2022-05-24 – 2022-05-27 (×7): 8.6 mg via ORAL
  Filled 2022-05-24 (×8): qty 1

## 2022-05-24 MED ORDER — UMECLIDINIUM BROMIDE 62.5 MCG/ACT IN AEPB
1.0000 | INHALATION_SPRAY | Freq: Every day | RESPIRATORY_TRACT | Status: DC
  Filled 2022-05-24: qty 7

## 2022-05-24 MED ORDER — HYDRALAZINE HCL 20 MG/ML IJ SOLN: 10.0000 mg | Freq: Four times a day (QID) | INTRAMUSCULAR | Status: DC | PRN

## 2022-05-24 MED ORDER — MONTELUKAST SODIUM 10 MG PO TABS
10.0000 mg | ORAL_TABLET | Freq: Every day | ORAL | Status: DC
  Administered 2022-05-24 – 2022-05-27 (×4): 10 mg via ORAL
  Filled 2022-05-24 (×4): qty 1

## 2022-05-24 MED ORDER — DOXYCYCLINE HYCLATE 100 MG PO TABS
100.0000 mg | ORAL_TABLET | Freq: Two times a day (BID) | ORAL | Status: DC
  Administered 2022-05-24 – 2022-05-28 (×8): 100 mg via ORAL
  Filled 2022-05-24 (×8): qty 1

## 2022-05-24 MED ORDER — POLYETHYLENE GLYCOL 3350 17 G PO PACK: 17.0000 g | PACK | Freq: Every day | ORAL | Status: DC | PRN

## 2022-05-24 MED ORDER — IPRATROPIUM-ALBUTEROL 0.5-2.5 (3) MG/3ML IN SOLN
3.0000 mL | Freq: Once | RESPIRATORY_TRACT | Status: AC
  Administered 2022-05-24: 3 mL via RESPIRATORY_TRACT
  Filled 2022-05-24: qty 3

## 2022-05-24 MED ORDER — ENOXAPARIN SODIUM 40 MG/0.4ML IJ SOSY
40.0000 mg | PREFILLED_SYRINGE | INTRAMUSCULAR | Status: DC
  Administered 2022-05-24 – 2022-05-27 (×4): 40 mg via SUBCUTANEOUS
  Filled 2022-05-24 (×4): qty 0.4

## 2022-05-24 NOTE — ED Notes (Signed)
ED TO INPATIENT HANDOFF REPORT  ED Nurse Name and Phone #: Corrie Dandy and [place], RN, [phone]  S Name/Age/Gender Cathy Hensley 82 y.o. female Room/Bed: 009C/009C  Code Status   Code Status: DNR  Home/SNF/Other Home Patient oriented to: self Is this baseline? No   Triage Complete: Triage complete  Chief Complaint Acute delirium [R41.0]  Triage Note Per daughter, pt disoriented x 2 days, lethargic, unable to perform ADL's, endorses urinary incontinence, some burning with urination; hx copd, 2.5 L baseline; normally A and O, able to perform adl's; denies fevers; swelling noted to bilateral LE and fingers, denies CHF hx; states she was recently seen by pulmonologist and was told she had low long volumes; denies increased sob, endorses baseline sob   Allergies No Known Allergies  Level of Care/Admitting Diagnosis ED Disposition     ED Disposition  Admit   Condition  --   Comment  Hospital Area: [facility] [[id number]]  Level of Care: Med-Surg [16]  May place patient in observation at [facility] or [facility] if equivalent level of care is available:: Yes  Covid Evaluation: Asymptomatic - no recent exposure (last 10 days) testing not required  Diagnosis: Acute delirium [186803]  Admitting Physician: Lorin Glass [[id number]]  Attending Physician: Lorin Glass [[id number]]          B Medical/Surgery History Past Medical History:  Diagnosis Date   Alpha-1-antitrypsin deficiency carrier    COPD 07/13/2006   HYPERTENSION 07/13/2006   MENOPAUSAL SYNDROME 07/13/2006   Other and unspecified hyperlipidemia 07/13/2006   Respiratory failure (HCC)    Chronic hypoxic   Past Surgical History:  Procedure Laterality Date   TONSILLECTOMY       A IV Location/Drains/Wounds Patient Lines/Drains/Airways Status     Active Line/Drains/Airways     Name Placement date Placement time Site Days   Peripheral IV 05/24/22 20 G Posterior;Right Hand 05/24/22  --  Hand  less than  1            Intake/Output Last 24 hours No intake or output data in the 24 hours ending 05/24/22 1812  Labs/Imaging Results for orders placed or performed during the hospital encounter of 05/24/22 (from the past 48 hour(s))  Comprehensive metabolic panel     Status: Abnormal   Collection Time: 05/24/22 12:56 PM  Result Value Ref Range   Sodium 147 (H) 135 - 145 mmol/L   Potassium 4.3 3.5 - 5.1 mmol/L   Chloride 90 (L) 98 - 111 mmol/L   CO2 >45 (H) 22 - 32 mmol/L    Comment: REPEATED TO VERIFY   Glucose, Bld 82 70 - 99 mg/dL    Comment: Glucose reference range applies only to samples taken after fasting for at least 8 hours.   BUN 18 8 - 23 mg/dL   Creatinine, Ser 9.56 0.44 - 1.00 mg/dL   Calcium 9.5 8.9 - 21.3 mg/dL   Total Protein 5.8 (L) 6.5 - 8.1 g/dL   Albumin 3.5 3.5 - 5.0 g/dL   AST 21 15 - 41 U/L   ALT 18 0 - 44 U/L   Alkaline Phosphatase 45 38 - 126 U/L   Total Bilirubin 0.6 0.3 - 1.2 mg/dL   GFR, Estimated >08 >65 mL/min    Comment: (NOTE) Calculated using the CKD-EPI Creatinine Equation (2021)    Anion gap NOT CALCULATED 5 - 15    Comment: Performed at [facility], [address] [place], [place] [address]  CBC  Status: Abnormal   Collection Time: 05/24/22 12:56 PM  Result Value Ref Range   WBC 4.4 4.0 - 10.5 K/uL   RBC 3.06 (L) 3.87 - 5.11 MIL/uL   Hemoglobin 9.7 (L) 12.0 - 15.0 g/dL   HCT 16.1 (L) 09.6 - 04.5 %   MCV 108.5 (H) 80.0 - 100.0 fL   MCH 31.7 26.0 - 34.0 pg   MCHC 29.2 (L) 30.0 - 36.0 g/dL   RDW 40.9 81.1 - 91.4 %   Platelets 120 (L) 150 - 400 K/uL    Comment: REPEATED TO VERIFY   nRBC 0.0 0.0 - 0.2 %    Comment: Performed at [facility], [address] [place], [place] [address]  Brain natriuretic peptide     Status: None   Collection Time: 05/24/22 12:56 PM  Result Value Ref Range   B Natriuretic Peptide 84.1 0.0 - 100.0 pg/mL    Comment: Performed at [facility], [address], [place], [place] [address]   Troponin I (High Sensitivity)     Status: Abnormal   Collection Time: 05/24/22  2:40 PM  Result Value Ref Range   Troponin I (High Sensitivity) 24 (H) <18 ng/L    Comment: (NOTE) Elevated high sensitivity troponin I (hsTnI) values and significant  changes across serial measurements may suggest ACS but many other  chronic and acute conditions are known to elevate hsTnI results.  Refer to the "Links" section for chest pain algorithms and additional  guidance. Performed at [facility], [address], [place], [place] [address]   CBG monitoring, ED     Status: Abnormal   Collection Time: 05/24/22  2:53 PM  Result Value Ref Range   Glucose-Capillary 62 (L) 70 - 99 mg/dL    Comment: Glucose reference range applies only to samples taken after fasting for at least 8 hours.  I-Stat venous blood gas, ([facility], [facility], [facility])     Status: Abnormal   Collection Time: 05/24/22  3:03 PM  Result Value Ref Range   pH, Ven 7.443 (H) 7.25 - 7.43   pCO2, Ven 79.8 (HH) 44 - 60 mmHg   pO2, Ven 92 (H) 32 - 45 mmHg   Bicarbonate 54.6 (H) 20.0 - 28.0 mmol/L   TCO2 >50 (H) 22 - 32 mmol/L   O2 Saturation 97 %   Acid-Base Excess 26.0 (H) 0.0 - 2.0 mmol/L   Sodium 141 135 - 145 mmol/L   Potassium 4.4 3.5 - 5.1 mmol/L   Calcium, Ion 1.10 (L) 1.15 - 1.40 mmol/L   HCT 31.0 (L) 36.0 - 46.0 %   Hemoglobin 10.5 (L) 12.0 - 15.0 g/dL   Sample type VENOUS    Comment NOTIFIED PHYSICIAN   CBG monitoring, ED     Status: None   Collection Time: 05/24/22  3:46 PM  Result Value Ref Range   Glucose-Capillary 99 70 - 99 mg/dL    Comment: Glucose reference range applies only to samples taken after fasting for at least 8 hours.  Troponin I (High Sensitivity)     Status: Abnormal   Collection Time: 05/24/22  3:48 PM  Result Value Ref Range   Troponin I (High Sensitivity) 27 (H) <18 ng/L    Comment: (NOTE) Elevated high sensitivity troponin I (hsTnI) values and significant  changes across serial measurements may  suggest ACS but many other  chronic and acute conditions are known to elevate hsTnI results.  Refer to the "Links" section for chest pain  algorithms and additional  guidance. Performed at [facility], [address] [place], [place] [address]   Urinalysis, Routine w reflex microscopic -Urine, Clean Catch     Status: Abnormal   Collection Time: 05/24/22  4:25 PM  Result Value Ref Range   Color, Urine YELLOW YELLOW   APPearance HAZY (A) CLEAR   Specific Gravity, Urine 1.015 1.005 - 1.030   pH 5.0 5.0 - 8.0   Glucose, UA NEGATIVE NEGATIVE mg/dL   Hgb urine dipstick SMALL (A) NEGATIVE   Bilirubin Urine NEGATIVE NEGATIVE   Ketones, ur 5 (A) NEGATIVE mg/dL   Protein, ur 30 (A) NEGATIVE mg/dL   Nitrite NEGATIVE NEGATIVE   Leukocytes,Ua NEGATIVE NEGATIVE   RBC / HPF 6-10 0 - 5 RBC/hpf   WBC, UA 0-5 0 - 5 WBC/hpf   Bacteria, UA RARE (A) NONE SEEN   Squamous Epithelial / HPF 0-5 0 - 5 /HPF   Mucus PRESENT     Comment: Performed at [facility], [address], [place], [place] [address]   CT Head Wo Contrast  Result Date: 05/24/2022 CLINICAL DATA:  Mental status change, unknown cause. EXAM: CT HEAD WITHOUT CONTRAST TECHNIQUE: Contiguous axial images were obtained from the base of the skull through the vertex without intravenous contrast. RADIATION DOSE REDUCTION: This exam was performed according to the departmental dose-optimization program which includes automated exposure control, adjustment of the mA and/or kV according to patient size and/or use of iterative reconstruction technique. COMPARISON:  Head CT 10/02/2003. FINDINGS: Brain: No acute hemorrhage. Moderate chronic small-vessel disease. Unchanged dystrophic appearing calcification in the medial aspect of the left occipital lobe. No hydrocephalus or extra-axial collection. No mass effect or midline shift. Vascular: No hyperdense vessel or unexpected calcification. Skull: No calvarial fracture or suspicious bone lesion.  Skull base is unremarkable. Sinuses/Orbits: Unremarkable. Other: None. IMPRESSION: 1. No acute intracranial abnormality. 2. Moderate chronic small-vessel disease. Electronically Signed   By: Orvan Falconer M.D.   On: 05/24/2022 14:35   DG Chest 2 View  Result Date: 05/24/2022 CLINICAL DATA:  Shortness of breath. EXAM: CHEST - 2 VIEW COMPARISON:  05/11/2021 FINDINGS: Lungs are hyperinflated. There is perihilar peribronchial thickening. There are no focal consolidations or pleural effusions. Heart is enlarged. No pulmonary edema. IMPRESSION: 1. Hyperinflation and bronchitic changes. 2. Cardiomegaly. No pulmonary edema. Electronically Signed   By: Norva Pavlov M.D.   On: 05/24/2022 13:28    Pending Labs Unresulted Labs (From admission, onward)     Start     Ordered   05/25/22 0500  Basic metabolic panel  Tomorrow morning,   R        05/24/22 1808   05/25/22 0500  CBC  Tomorrow morning,   R        05/24/22 1808            Vitals/Pain Today's Vitals   05/24/22 1530 05/24/22 1615 05/24/22 1700 05/24/22 1730  BP: (!) 172/59 (!) 162/68 (!) 160/66 (!) 165/59  Pulse: 81 76 74 79  Resp: 18 (!) 22 18 20   Temp:      TempSrc:      SpO2: 93% 97% 100% 100%  PainSc:        Isolation Precautions No active isolations  Medications Medications  albuterol (VENTOLIN HFA) 108 (90 Base) MCG/ACT inhaler 2 puff (has no administration in time range)  amLODipine (NORVASC) tablet 5 mg (has no administration in time range)  arformoterol (BROVANA) nebulizer solution 15 mcg (has no  administration in time range)  aspirin chewable tablet 81 mg (has no administration in time range)  budesonide (PULMICORT) nebulizer solution 0.25 mg (has no administration in time range)  dextromethorphan-guaiFENesin (MUCINEX DM) 30-600 MG per 12 hr tablet 1 tablet (has no administration in time range)  montelukast (SINGULAIR) tablet 10 mg (has no administration in time range)  simvastatin (ZOCOR) tablet 20 mg (has no  administration in time range)  Tiotropium Bromide Monohydrate AERS 2 puff (has no administration in time range)  acetaminophen (TYLENOL) tablet 650 mg (has no administration in time range)    Or  acetaminophen (TYLENOL) suppository 650 mg (has no administration in time range)  hydrALAZINE (APRESOLINE) injection 10 mg (has no administration in time range)  enoxaparin (LOVENOX) injection 40 mg (has no administration in time range)  senna (SENOKOT) tablet 8.6 mg (has no administration in time range)  polyethylene glycol (MIRALAX / GLYCOLAX) packet 17 g (has no administration in time range)  ipratropium-albuterol (DUONEB) 0.5-2.5 (3) MG/3ML nebulizer solution 3 mL (3 mLs Nebulization Given 05/24/22 1752)    Mobility walks with person assist     Focused Assessments Neuro Assessment Handoff:  Swallow screen pass? Yes          Neuro Assessment: Exceptions to WDL Neuro Checks:      Has TPA been given? No If patient is a Neuro Trauma and patient is going to OR before floor call report to 4N Charge nurse: [phone] or [phone]   R Recommendations: See Admitting Provider Note  Report given to:   Additional Notes: pt is on 2.5L [place] baseline.

## 2022-05-24 NOTE — ED Notes (Signed)
CBG 64 

## 2022-05-24 NOTE — H&P (Signed)
[facility] History and Physical  Cathy Hensley ZOX:[id number] DOB: [birth year] DOA: 05/24/2022 PCP: Deeann Saint, MD  Admitted from: home Chief Complaint: Confusion, disorientation  History of Present Illness: Cathy Hensley is a 82 y.o. female with PMH significant for alpha-1 antitrypsin deficiency, past history of smoking, severe COPD, chronic hypercarbic respiratory failure on 2.5 to 4 L of oxygen at home, not on positive pressure ventilation, follows up with pulmonology [name] as an outpatient. Last seen by [name] on 4/29.  No changes made at the time. Also with history of chronic short-term memory loss, hypertension, hyperlipidemia Brought to the ED by family today for 2 to 3 days of disorientation, waxing and waning confusion, incomprehensible speech at times.  Also endorsed dysuria, but no fever.  In the ED, patient was afebrile, heart rate in 80s, blood pressure in 140s, breathing on room air Labs showed a venous blood gas with pH of 7.44, pCO2 80, bicarb 55, troponin mildly elevated 27 Urinalysis with hazy yellow urine with small hemoglobin, negative leukocytes, negative nitrite, rare bacteria Chest x-ray showed hyperinflation and bronchitic changes, no pulm edema CT head did not show any intracranial abnormality, showed moderate chronic small vessel disease. Patient received DuoNebs in the ED Hospitalist service was consulted for overnight observation for altered mentation.  At the time of my evaluation, patient was propped up in bed.  Elderly Caucasian female.  Not in physical distress.  Confused, frail.  Daughter and husband at bedside. No wheezing or cough noted.  Review of Systems:  All systems were reviewed and were negative unless otherwise mentioned in the HPI   Past medical history: Past Medical History:  Diagnosis Date   Alpha-1-antitrypsin deficiency carrier    COPD 07/13/2006   HYPERTENSION 07/13/2006   MENOPAUSAL SYNDROME 07/13/2006   Other and  unspecified hyperlipidemia 07/13/2006   Respiratory failure (HCC)    Chronic hypoxic    Past surgical history: Past Surgical History:  Procedure Laterality Date   TONSILLECTOMY      Social History:  reports that she quit smoking about 21 years ago. Her smoking use included cigarettes. She has a 21.00 pack-year smoking history. She has been exposed to tobacco smoke. She has never used smokeless tobacco. She reports current alcohol use of about 1.0 standard drink of alcohol per week. She reports that she does not use drugs.  Allergies:  No Known Allergies Patient has no known allergies.   Family history:  Family History  Problem Relation Age of Onset   AAA (abdominal aortic aneurysm) Mother    Emphysema Mother    Aneurysm Sister        brain   Emphysema Father    Lymphoma Sister      Home Meds: Prior to Admission medications   Medication Sig Start Date End Date Taking? Authorizing Provider  acetaminophen (TYLENOL) 500 MG tablet Take 500 mg by mouth every 6 (six) hours as needed.    [provider]  albuterol (VENTOLIN HFA) 108 (90 Base) MCG/ACT inhaler INHALE 2 PUFFS INTO THE LUNGS EVERY 6 HOURS AS NEEDED FOR WHEEZING OR SHORTNESS OF BREATH 10/14/21   Deeann Saint, MD  amLODipine (NORVASC) 5 MG tablet TAKE 1 TABLET(5 MG) BY MOUTH DAILY 12/28/21   Deeann Saint, MD  arformoterol (BROVANA) 15 MCG/2ML NEBU USE 1 VIAL  IN  NEBULIZER TWICE  DAILY - morning and evening 10/10/20   Deeann Saint, MD  aspirin 81 MG chewable tablet Chew 1 tablet (81 mg  total) by mouth daily. 09/07/14   Lonia Blood, MD  Blood Pressure KIT 1 Device by Does not apply route daily. 02/18/22   Deeann Saint, MD  budesonide (PULMICORT) 0.25 MG/2ML nebulizer solution Use 1 vial in nebulizer twice daily.  Once mouth after treatment. 10/10/20   Deeann Saint, MD  dextromethorphan-guaiFENesin (MUCINEX DM) 30-600 MG 12hr tablet Take 1 tablet by mouth 2 (two) times daily.    [provider]  montelukast (SINGULAIR) 10 MG tablet TAKE 1 TABLET(10 MG) BY MOUTH AT BEDTIME 12/28/21   Deeann Saint, MD  Multiple Vitamins-Minerals (CENTRUM SILVER PO) Take 1 tablet by mouth daily.    [provider]  OXYGEN 2.5 lpm 24/7 (she uses 4lpm pulsed when on POC)    [provider]  Respiratory Therapy Supplies (FLUTTER) DEVI 1 Device by Does not apply route as needed. 12/15/16   Nyoka Cowden, MD  simvastatin (ZOCOR) 20 MG tablet TAKE 1 TABLET(20 MG) BY MOUTH AT BEDTIME 11/30/21   Deeann Saint, MD  SPIRIVA RESPIMAT 2.5 MCG/ACT AERS INHALE 2 PUFFS INTO THE LUNGS DAILY 03/16/22   Nyoka Cowden, MD    Physical Exam: Vitals:   05/24/22 1700 05/24/22 1730 05/24/22 1830 05/24/22 1843  BP: (!) 160/66 (!) 165/59 (!) 166/60   Pulse: 74 79 85   Resp: 18 20  (!) 24  Temp:    98.2 F (36.8 C)  TempSrc:    Oral  SpO2: 100% 100% 99%    Wt Readings from Last 3 Encounters:  05/03/22 40.3 kg  03/25/22 40.3 kg  02/18/22 39.5 kg   There is no height or weight on file to calculate BMI.  General exam: Pleasant, cachectic elderly Caucasian female. Skin: No rashes, lesions or ulcers. HEENT: Atraumatic, normocephalic, no obvious bleeding Lungs: Diminished air entry in both bases.  No crackles or wheezing CVS: Regular rate and rhythm, no murmur GI/Abd soft, nontender, nondistended, bowel sound present CNS: Alert, awake, oriented to her name only.  Confused and restless Psychiatry: Confused Extremities: Pedal edema 1+ bilaterally   ------------------------------------------------------------------------------------------------------ Assessment/Plan: Acute metabolic encephalopathy Chronic short memory loss Brought in for disorientation, waxing and waning confusion, incomprehensible speech in the setting of chronic hypercapnia.   Mentions dysuria.  Probably has UTI triggering confusion. Also has hypercapnia but is chronic. CT head unremarkable. Observe  overnight.  If mental status worsens, may need MRI.  UTI Complains of dysuria for last 1 week. Urinalysis unremarkable. Because of UTI symptoms and worsening confusion, I would start her on short course of IV Rocephin.  Discussed with daughter at bedside  Severe COPD Chronic hypoxic hypercapnic respiratory failure No fever.  Complains of cough with thick sputum.  No infiltrates on chest x-ray PTA on Pulmicort twice daily, Spiriva daily, Brovana twice daily, Singulair daily albuterol as needed and Mucinex DM twice daily. 4/29, seen by pulmonologist.  No changes in breathing medicine at the time. Resume all. Chronically on 2.5 to 4 L oxygen.  Not on positive pressure ventilation.  Given her severe hypercapnia, I offered BiPAP overnight.  If she tolerates, may help improve hypercapnia and confusion.  Will order for BiPAP tonight. Start on empiric IV Rocephin and oral doxycycline  Elevated troponin Mildly elevated troponin likely because of difficulty breathing. Recent Labs    05/24/22 1440 05/24/22 1548  TROPONINIHS 24* 27*   HTN Bilateral lower extremity edema Patient was previously on amlodipine 5 mg daily.  Given the concern of bilateral pedal edema, amlodipine  was stopped recently.  I would avoid diuresis given poor oral intake already.  I offered for TED hose  HLD Continue aspirin 81 mg daily and simvastatin  Impaired mobility PT eval ordered  Goals of care   Code Status: DNR.  Discussed with husband and daughter at bedside.  Daughter states that in the past, her pulmonologist [name] had also opened up a discussion about hospice if her condition worsens.   DVT prophylaxis:  enoxaparin (LOVENOX) injection 40 mg Start: 05/24/22 2000 Place TED hose Start: 05/24/22 1843   Antimicrobials: IV Rocephin and oral doxycycline Fluid: None Consultants: None Family Communication: Daughter and husband at bedside  Dispo: The patient is from: Home              Anticipated d/c is  to: Home hopefully in 1 to 2 days  Diet: Diet Order             Diet regular Room service appropriate? Yes; Fluid consistency: Thin  Diet effective now                   ------------------------------------------------------------------------------------- Severity of Illness: The appropriate patient status for this patient is OBSERVATION. Observation status is judged to be reasonable and necessary in order to provide the required intensity of service to ensure the patient's safety. The patient's presenting symptoms, physical exam findings, and initial radiographic and laboratory data in the context of their medical condition is felt to place them at decreased risk for further clinical deterioration. Furthermore, it is anticipated that the patient will be medically stable for discharge from the hospital within 2 midnights of admission.  -------------------------------------------------------------------------------------   Labs on Admission:   CBC: Recent Labs  Lab 05/24/22 1256 05/24/22 1503  WBC 4.4  --   HGB 9.7* 10.5*  HCT 33.2* 31.0*  MCV 108.5*  --   PLT 120*  --     Basic Metabolic Panel: Recent Labs  Lab 05/24/22 1256 05/24/22 1503  NA 147* 141  K 4.3 4.4  CL 90*  --   CO2 >45*  --   GLUCOSE 82  --   BUN 18  --   CREATININE 0.51  --   CALCIUM 9.5  --     Liver Function Tests: Recent Labs  Lab 05/24/22 1256  AST 21  ALT 18  ALKPHOS 45  BILITOT 0.6  PROT 5.8*  ALBUMIN 3.5   No results for input(s): "LIPASE", "AMYLASE" in the last 168 hours. No results for input(s): "AMMONIA" in the last 168 hours.  Cardiac Enzymes: No results for input(s): "CKTOTAL", "CKMB", "CKMBINDEX", "TROPONINI" in the last 168 hours.  BNP (last 3 results) Recent Labs    05/24/22 1256  BNP 84.1    ProBNP (last 3 results) Recent Labs    02/04/22 1625  PROBNP 36.0    CBG: Recent Labs  Lab 05/24/22 1453 05/24/22 1546  GLUCAP 62* 99    Lipase  No results  found for: "LIPASE"   Urinalysis    Component Value Date/Time   COLORURINE YELLOW 05/24/2022 1625   APPEARANCEUR HAZY (A) 05/24/2022 1625   LABSPEC 1.015 05/24/2022 1625   PHURINE 5.0 05/24/2022 1625   GLUCOSEU NEGATIVE 05/24/2022 1625   GLUCOSEU NEGATIVE 02/05/2022 0824   HGBUR SMALL (A) 05/24/2022 1625   HGBUR trace-lysed 10/19/2006 0813   BILIRUBINUR NEGATIVE 05/24/2022 1625   BILIRUBINUR negative 02/04/2022 1659   KETONESUR 5 (A) 05/24/2022 1625   PROTEINUR 30 (A) 05/24/2022 1625   UROBILINOGEN 0.2 02/05/2022  1610   NITRITE NEGATIVE 05/24/2022 1625   LEUKOCYTESUR NEGATIVE 05/24/2022 1625     Drugs of Abuse  No results found for: "LABOPIA", "COCAINSCRNUR", "LABBENZ", "AMPHETMU", "THCU", "LABBARB"    Radiological Exams on Admission: CT Head Wo Contrast  Result Date: 05/24/2022 CLINICAL DATA:  Mental status change, unknown cause. EXAM: CT HEAD WITHOUT CONTRAST TECHNIQUE: Contiguous axial images were obtained from the base of the skull through the vertex without intravenous contrast. RADIATION DOSE REDUCTION: This exam was performed according to the departmental dose-optimization program which includes automated exposure control, adjustment of the mA and/or kV according to patient size and/or use of iterative reconstruction technique. COMPARISON:  Head CT 10/02/2003. FINDINGS: Brain: No acute hemorrhage. Moderate chronic small-vessel disease. Unchanged dystrophic appearing calcification in the medial aspect of the left occipital lobe. No hydrocephalus or extra-axial collection. No mass effect or midline shift. Vascular: No hyperdense vessel or unexpected calcification. Skull: No calvarial fracture or suspicious bone lesion. Skull base is unremarkable. Sinuses/Orbits: Unremarkable. Other: None. IMPRESSION: 1. No acute intracranial abnormality. 2. Moderate chronic small-vessel disease. Electronically Signed   By: Orvan Falconer M.D.   On: 05/24/2022 14:35   DG Chest 2 View  Result  Date: 05/24/2022 CLINICAL DATA:  Shortness of breath. EXAM: CHEST - 2 VIEW COMPARISON:  05/11/2021 FINDINGS: Lungs are hyperinflated. There is perihilar peribronchial thickening. There are no focal consolidations or pleural effusions. Heart is enlarged. No pulmonary edema. IMPRESSION: 1. Hyperinflation and bronchitic changes. 2. Cardiomegaly. No pulmonary edema. Electronically Signed   By: Norva Pavlov M.D.   On: 05/24/2022 13:28     Signed, Lorin Glass, MD [facility] 05/24/2022

## 2022-05-24 NOTE — ED Provider Notes (Signed)
[place] EMERGENCY DEPARTMENT AT [facility] Provider Note   CSN: [id number] Arrival date & time: 05/24/22  1155     History  No chief complaint on file.   Cathy Hensley is a 82 y.o. female.  HPI Patient presents for altered mental status.  Medical history includes COPD, HTN, HLD.  Daughter reports that she has chronic short-term memory loss but she has had recent disorientation.  Family describes this as intermittent confusion and nonsensical speech over the weekend.  At baseline, she uses nebulized breathing treatments 1-2 times per day.  Her significant other states that she went a full day without using them due to somnolence.  Patient has complained of some recent dysuria.  She has not had any known falls.    Home Medications Prior to Admission medications   Medication Sig Start Date End Date Taking? Authorizing Provider  acetaminophen (TYLENOL) 500 MG tablet Take 500 mg by mouth every 6 (six) hours as needed.    [provider]  albuterol (VENTOLIN HFA) 108 (90 Base) MCG/ACT inhaler INHALE 2 PUFFS INTO THE LUNGS EVERY 6 HOURS AS NEEDED FOR WHEEZING OR SHORTNESS OF BREATH 10/14/21   Deeann Saint, MD  amLODipine (NORVASC) 5 MG tablet TAKE 1 TABLET(5 MG) BY MOUTH DAILY 12/28/21   Deeann Saint, MD  arformoterol (BROVANA) 15 MCG/2ML NEBU USE 1 VIAL  IN  NEBULIZER TWICE  DAILY - morning and evening 10/10/20   Deeann Saint, MD  aspirin 81 MG chewable tablet Chew 1 tablet (81 mg total) by mouth daily. 09/07/14   Lonia Blood, MD  Blood Pressure KIT 1 Device by Does not apply route daily. 02/18/22   Deeann Saint, MD  budesonide (PULMICORT) 0.25 MG/2ML nebulizer solution Use 1 vial in nebulizer twice daily.  Once mouth after treatment. 10/10/20   Deeann Saint, MD  dextromethorphan-guaiFENesin (MUCINEX DM) 30-600 MG 12hr tablet Take 1 tablet by mouth 2 (two) times daily.    [provider]  montelukast (SINGULAIR) 10 MG tablet TAKE 1  TABLET(10 MG) BY MOUTH AT BEDTIME 12/28/21   Deeann Saint, MD  Multiple Vitamins-Minerals (CENTRUM SILVER PO) Take 1 tablet by mouth daily.    [provider]  OXYGEN 2.5 lpm 24/7 (she uses 4lpm pulsed when on POC)    [provider]  Respiratory Therapy Supplies (FLUTTER) DEVI 1 Device by Does not apply route as needed. 12/15/16   Nyoka Cowden, MD  simvastatin (ZOCOR) 20 MG tablet TAKE 1 TABLET(20 MG) BY MOUTH AT BEDTIME 11/30/21   Deeann Saint, MD  SPIRIVA RESPIMAT 2.5 MCG/ACT AERS INHALE 2 PUFFS INTO THE LUNGS DAILY 03/16/22   Nyoka Cowden, MD      Allergies    Patient has no known allergies.    Review of Systems   Review of Systems  Constitutional:  Positive for activity change, appetite change and fatigue.  Genitourinary:  Positive for dysuria.  Psychiatric/Behavioral:  Positive for confusion.   All other systems reviewed and are negative.   Physical Exam Updated Vital Signs BP (!) 166/60   Pulse 85   Temp 98.2 F (36.8 C) (Oral)   Resp (!) 24   SpO2 99%  Physical Exam Vitals and nursing note reviewed.  Constitutional:      General: She is not in acute distress.    Appearance: Normal appearance. She is well-developed. She is ill-appearing. She is not toxic-appearing or diaphoretic.  HENT:     Head:  Normocephalic and atraumatic.     Right Ear: External ear normal.     Left Ear: External ear normal.     Nose: Nose normal.     Mouth/Throat:     Mouth: Mucous membranes are moist.  Eyes:     Extraocular Movements: Extraocular movements intact.     Conjunctiva/sclera: Conjunctivae normal.  Cardiovascular:     Rate and Rhythm: Normal rate and regular rhythm.     Heart sounds: No murmur heard. Pulmonary:     Effort: Pulmonary effort is normal. No respiratory distress.     Breath sounds: Decreased air movement present. Decreased breath sounds present.  Abdominal:     General: There is no distension.     Palpations: Abdomen is soft.      Tenderness: There is no abdominal tenderness.  Musculoskeletal:        General: No swelling. Normal range of motion.     Cervical back: Neck supple.     Right lower leg: No edema.     Left lower leg: No edema.  Skin:    General: Skin is warm and dry.     Coloration: Skin is not jaundiced or pale.  Neurological:     General: No focal deficit present.     Mental Status: She is alert. She is disoriented.  Psychiatric:        Mood and Affect: Mood normal.        Behavior: Behavior normal.     ED Results / Procedures / Treatments   Labs (all labs ordered are listed, but only abnormal results are displayed) Labs Reviewed  COMPREHENSIVE METABOLIC PANEL - Abnormal; Notable for the following components:      Result Value   Sodium 147 (*)    Chloride 90 (*)    CO2 >45 (*)    Total Protein 5.8 (*)    All other components within normal limits  CBC - Abnormal; Notable for the following components:   RBC 3.06 (*)    Hemoglobin 9.7 (*)    HCT 33.2 (*)    MCV 108.5 (*)    MCHC 29.2 (*)    Platelets 120 (*)    All other components within normal limits  URINALYSIS, ROUTINE W REFLEX MICROSCOPIC - Abnormal; Notable for the following components:   APPearance HAZY (*)    Hgb urine dipstick SMALL (*)    Ketones, ur 5 (*)    Protein, ur 30 (*)    Bacteria, UA RARE (*)    All other components within normal limits  CBG MONITORING, ED - Abnormal; Notable for the following components:   Glucose-Capillary 62 (*)    All other components within normal limits  I-STAT VENOUS BLOOD GAS, ED - Abnormal; Notable for the following components:   pH, Ven 7.443 (*)    pCO2, Ven 79.8 (*)    pO2, Ven 92 (*)    Bicarbonate 54.6 (*)    TCO2 >50 (*)    Acid-Base Excess 26.0 (*)    Calcium, Ion 1.10 (*)    HCT 31.0 (*)    Hemoglobin 10.5 (*)    All other components within normal limits  TROPONIN I (HIGH SENSITIVITY) - Abnormal; Notable for the following components:   Troponin I (High Sensitivity) 24 (*)     All other components within normal limits  TROPONIN I (HIGH SENSITIVITY) - Abnormal; Notable for the following components:   Troponin I (High Sensitivity) 27 (*)    All other components within normal  limits  BRAIN NATRIURETIC PEPTIDE  BASIC METABOLIC PANEL  CBC  CBG MONITORING, ED    EKG None  Radiology CT Head Wo Contrast  Result Date: 05/24/2022 CLINICAL DATA:  Mental status change, unknown cause. EXAM: CT HEAD WITHOUT CONTRAST TECHNIQUE: Contiguous axial images were obtained from the base of the skull through the vertex without intravenous contrast. RADIATION DOSE REDUCTION: This exam was performed according to the departmental dose-optimization program which includes automated exposure control, adjustment of the mA and/or kV according to patient size and/or use of iterative reconstruction technique. COMPARISON:  Head CT 10/02/2003. FINDINGS: Brain: No acute hemorrhage. Moderate chronic small-vessel disease. Unchanged dystrophic appearing calcification in the medial aspect of the left occipital lobe. No hydrocephalus or extra-axial collection. No mass effect or midline shift. Vascular: No hyperdense vessel or unexpected calcification. Skull: No calvarial fracture or suspicious bone lesion. Skull base is unremarkable. Sinuses/Orbits: Unremarkable. Other: None. IMPRESSION: 1. No acute intracranial abnormality. 2. Moderate chronic small-vessel disease. Electronically Signed   By: Orvan Falconer M.D.   On: 05/24/2022 14:35   DG Chest 2 View  Result Date: 05/24/2022 CLINICAL DATA:  Shortness of breath. EXAM: CHEST - 2 VIEW COMPARISON:  05/11/2021 FINDINGS: Lungs are hyperinflated. There is perihilar peribronchial thickening. There are no focal consolidations or pleural effusions. Heart is enlarged. No pulmonary edema. IMPRESSION: 1. Hyperinflation and bronchitic changes. 2. Cardiomegaly. No pulmonary edema. Electronically Signed   By: Norva Pavlov M.D.   On: 05/24/2022 13:28     Procedures Procedures    Medications Ordered in ED Medications  albuterol (PROVENTIL) (2.5 MG/3ML) 0.083% nebulizer solution 2.5 mg (has no administration in time range)  arformoterol (BROVANA) nebulizer solution 15 mcg (has no administration in time range)  aspirin chewable tablet 81 mg (has no administration in time range)  budesonide (PULMICORT) nebulizer solution 0.25 mg (has no administration in time range)  dextromethorphan-guaiFENesin (MUCINEX DM) 30-600 MG per 12 hr tablet 1 tablet (has no administration in time range)  montelukast (SINGULAIR) tablet 10 mg (has no administration in time range)  simvastatin (ZOCOR) tablet 20 mg (has no administration in time range)  Tiotropium Bromide Monohydrate AERS 2 puff (has no administration in time range)  acetaminophen (TYLENOL) tablet 650 mg (has no administration in time range)    Or  acetaminophen (TYLENOL) suppository 650 mg (has no administration in time range)  hydrALAZINE (APRESOLINE) injection 10 mg (has no administration in time range)  enoxaparin (LOVENOX) injection 40 mg (has no administration in time range)  senna (SENOKOT) tablet 8.6 mg (has no administration in time range)  polyethylene glycol (MIRALAX / GLYCOLAX) packet 17 g (has no administration in time range)  cefTRIAXone (ROCEPHIN) 1 g in sodium chloride 0.9 % 100 mL IVPB (has no administration in time range)  melatonin tablet 3 mg (has no administration in time range)  doxycycline (VIBRA-TABS) tablet 100 mg (has no administration in time range)  ipratropium-albuterol (DUONEB) 0.5-2.5 (3) MG/3ML nebulizer solution 3 mL (3 mLs Nebulization Given 05/24/22 1752)    ED Course/ Medical Decision Making/ A&P                             Medical Decision Making Amount and/or Complexity of Data Reviewed Radiology: ordered.  Risk Prescription drug management. Decision regarding hospitalization.   This patient presents to the ED for concern of altered mental status,  this involves an extensive number of treatment options, and is a complaint that carries with it  a high risk of complications and morbidity.  The differential diagnosis includes delirium, polypharmacy, infection, metabolic derangements, hypercarbia, CVA, ICH, occult trauma   Co morbidities that complicate the patient evaluation  COPD, HTN, HLD   Additional history obtained:  Additional history obtained from patient's daughter, patient's significant other External records from outside source obtained and reviewed including EMR   Lab Tests:  I Ordered, and personally interpreted labs.  The pertinent results include: Compensated hypercarbia on blood gas; slightly elevated troponin with no change on repeat; no evidence of UTI; slight decrease in hemoglobin from baseline, no leukocytosis   Imaging Studies ordered:  I ordered imaging studies including chest x-ray, CT head I independently visualized and interpreted imaging which showed no acute findings I agree with the radiologist interpretation   Cardiac Monitoring: / EKG:  The patient was maintained on a cardiac monitor.  I personally viewed and interpreted the cardiac monitored which showed an underlying rhythm of: Sinus rhythm  Problem List / ED Course / Critical interventions / Medication management  Patient presents for waxing and waning confusion over the past 2 to 3 days.  Symptoms described by daughter consistent with delirium.  Vital signs on arrival are notable for hypertension.  Patient is currently on her baseline supplemental oxygen.  History from patient is limited by dementia and possibly by acute encephalopathy.  On lung auscultation, she has diminished air movement.  She is able to speak in complete sentences.  She does not appear to have increased work of breathing.  Abdomen is soft and nontender.  Laboratory workup was initiated.  Results were notable for compensated hypercarbia, slight decrease in hemoglobin from  baseline, baseline hyponatremia.  Chest x-ray and CT head did not show acute findings.  Urinalysis showed no evidence of infection, despite patient's recent dysuria.  I suspect the patient may have had waxing and waning worsening hypercarbic respiratory failure.  Solu-Medrol and DuoNeb were ordered to optimize pulmonary function.  Patient was admitted for further management. I ordered medication including Solu-Medrol and DuoNeb for COPD Reevaluation of the patient after these medicines showed that the patient improved I have reviewed the patients home medicines and have made adjustments as needed   Social Determinants of Health:  Lives at home with partner         Final Clinical Impression(s) / ED Diagnoses Final diagnoses:  Altered mental status, unspecified altered mental status type  Chronic obstructive pulmonary disease, unspecified COPD type [facility])    Rx / DC Orders ED Discharge Orders     None         Gloris Manchester, MD 05/24/22 [phone]

## 2022-05-24 NOTE — Progress Notes (Signed)
TRH night cross cover note:   I was contacted by RN regarding this patient who is admitted earlier today for acute metabolic encephalopathy complicated by chronic hypoxic hypercapnic respiratory failure with plan that includes the initiation of overnight BiPAP to attend to any contribution towards the patient's acute encephalopathy stemming from hypercapnia.  I confirmed with the patient's RN that the patient is not on BiPAP as outpatient, including no qhs BiPAP at home. RN conveys that the patient is currently on a med telemetry floor, but that this floor is unable to initiate new BiPAP.  In order to transfer the patient from med/tele to PCU/SDU to accommodate plan for overnight initiation of BiPAP, I have placed orders for the following:  1) placed a new admit order, specifying admission to PCU/SDU 2) placed a transfer order, requesting the patient be transferred to PCU/SDU 3) placed an order to change the patient's level of care to a PCU/SDU      Newton Pigg, DO Hospitalist

## 2022-05-24 NOTE — ED Triage Notes (Addendum)
Per daughter, pt disoriented x 2 days, lethargic, unable to perform ADL's, endorses urinary incontinence, some burning with urination; hx copd, 2.5 L baseline; normally A and O, able to perform adl's; denies fevers; swelling noted to bilateral LE and fingers, denies CHF hx; states she was recently seen by pulmonologist and was told she had low long volumes; denies increased sob, endorses baseline sob
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
## 2022-05-24 NOTE — ED Notes (Signed)
Pt's cbg is 62. Orange juice given.

## 2022-05-25 DIAGNOSIS — Z79899 Other long term (current) drug therapy: Secondary | ICD-10-CM | POA: Diagnosis not present

## 2022-05-25 DIAGNOSIS — E8801 Alpha-1-antitrypsin deficiency: Secondary | ICD-10-CM | POA: Diagnosis present

## 2022-05-25 DIAGNOSIS — J9611 Chronic respiratory failure with hypoxia: Secondary | ICD-10-CM | POA: Diagnosis present

## 2022-05-25 DIAGNOSIS — J449 Chronic obstructive pulmonary disease, unspecified: Secondary | ICD-10-CM | POA: Diagnosis present

## 2022-05-25 DIAGNOSIS — Z7409 Other reduced mobility: Secondary | ICD-10-CM | POA: Diagnosis present

## 2022-05-25 DIAGNOSIS — F0392 Unspecified dementia, unspecified severity, with psychotic disturbance: Secondary | ICD-10-CM | POA: Diagnosis present

## 2022-05-25 DIAGNOSIS — Z7951 Long term (current) use of inhaled steroids: Secondary | ICD-10-CM | POA: Diagnosis not present

## 2022-05-25 DIAGNOSIS — L89151 Pressure ulcer of sacral region, stage 1: Secondary | ICD-10-CM | POA: Diagnosis present

## 2022-05-25 DIAGNOSIS — Z66 Do not resuscitate: Secondary | ICD-10-CM | POA: Diagnosis present

## 2022-05-25 DIAGNOSIS — Z7982 Long term (current) use of aspirin: Secondary | ICD-10-CM | POA: Diagnosis not present

## 2022-05-25 DIAGNOSIS — Z825 Family history of asthma and other chronic lower respiratory diseases: Secondary | ICD-10-CM | POA: Diagnosis not present

## 2022-05-25 DIAGNOSIS — N390 Urinary tract infection, site not specified: Secondary | ICD-10-CM | POA: Diagnosis present

## 2022-05-25 DIAGNOSIS — J9612 Chronic respiratory failure with hypercapnia: Secondary | ICD-10-CM | POA: Diagnosis present

## 2022-05-25 DIAGNOSIS — Z515 Encounter for palliative care: Secondary | ICD-10-CM | POA: Diagnosis not present

## 2022-05-25 DIAGNOSIS — R7989 Other specified abnormal findings of blood chemistry: Secondary | ICD-10-CM | POA: Diagnosis present

## 2022-05-25 DIAGNOSIS — E871 Hypo-osmolality and hyponatremia: Secondary | ICD-10-CM | POA: Diagnosis present

## 2022-05-25 DIAGNOSIS — Z7189 Other specified counseling: Secondary | ICD-10-CM | POA: Diagnosis not present

## 2022-05-25 DIAGNOSIS — Z9981 Dependence on supplemental oxygen: Secondary | ICD-10-CM | POA: Diagnosis not present

## 2022-05-25 DIAGNOSIS — I10 Essential (primary) hypertension: Secondary | ICD-10-CM | POA: Diagnosis present

## 2022-05-25 DIAGNOSIS — G9341 Metabolic encephalopathy: Secondary | ICD-10-CM | POA: Diagnosis present

## 2022-05-25 DIAGNOSIS — F05 Delirium due to known physiological condition: Secondary | ICD-10-CM | POA: Diagnosis present

## 2022-05-25 DIAGNOSIS — R410 Disorientation, unspecified: Secondary | ICD-10-CM | POA: Diagnosis present

## 2022-05-25 DIAGNOSIS — E8770 Fluid overload, unspecified: Secondary | ICD-10-CM | POA: Diagnosis present

## 2022-05-25 DIAGNOSIS — E785 Hyperlipidemia, unspecified: Secondary | ICD-10-CM | POA: Diagnosis present

## 2022-05-25 DIAGNOSIS — Z87891 Personal history of nicotine dependence: Secondary | ICD-10-CM | POA: Diagnosis not present

## 2022-05-25 DIAGNOSIS — Z780 Asymptomatic menopausal state: Secondary | ICD-10-CM | POA: Diagnosis not present

## 2022-05-25 DIAGNOSIS — R54 Age-related physical debility: Secondary | ICD-10-CM | POA: Diagnosis present

## 2022-05-25 LAB — BASIC METABOLIC PANEL
BUN: 16 mg/dL (ref 8–23)
CO2: >45 mmol/L — ABNORMAL HIGH (ref 22–32)
Calcium: 9 mg/dL (ref 8.9–10.3)
Chloride: 87 mmol/L — ABNORMAL LOW (ref 98–111)
Creatinine, Ser: 0.53 mg/dL (ref 0.44–1.00)
GFR, Estimated: >60 mL/min (ref >60)
Glucose, Bld: 101 mg/dL — ABNORMAL HIGH (ref 70–99)
Potassium: 4 mmol/L (ref 3.5–5.1)
Sodium: 143 mmol/L (ref 135–145)

## 2022-05-25 LAB — CBC
HCT: 28.9 % — ABNORMAL LOW (ref 36.0–46.0)
Hemoglobin: 8.7 g/dL — ABNORMAL LOW (ref 12.0–15.0)
MCH: 31.4 pg (ref 26.0–34.0)
MCHC: 30.1 g/dL (ref 30.0–36.0)
MCV: 104.3 fL — ABNORMAL HIGH (ref 80.0–100.0)
Platelets: 116 10*3/uL — ABNORMAL LOW (ref 150–400)
RBC: 2.77 MIL/uL — ABNORMAL LOW (ref 3.87–5.11)
RDW: 12.8 % (ref 11.5–15.5)
WBC: 4.4 10*3/uL (ref 4.0–10.5)
nRBC: 0 % (ref 0.0–0.2)

## 2022-05-25 MED ORDER — UMECLIDINIUM BROMIDE 62.5 MCG/ACT IN AEPB
1.0000 | INHALATION_SPRAY | Freq: Every day | RESPIRATORY_TRACT | Status: DC
  Administered 2022-05-26 – 2022-05-28 (×3): 1 via RESPIRATORY_TRACT
  Filled 2022-05-25: qty 7
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
## 2022-05-25 NOTE — Progress Notes (Signed)
1:15a report given to receiving RN on 6east.   135a Transferred to 6 east via bed. Daughter still at bedside. Pt settled into new room with receiving RN present. Pt pleasant for transfer however remains confused.

## 2022-05-25 NOTE — Progress Notes (Signed)
Communicated with Dr Arlean Hopping tonight that patient has an order for Bipap and could not be initiated as patient was in acute care setting and required higher level of care for initiation. Pt in no apparent distress.  In communication with bed placement tonight. Unable to place patient right away as no bed was immediately available.  At 12:10 patient assigned to bed on 6 east however awaiting completion of cleaning.  Pt remains stable, no complaints. Daughter at bedside updated

## 2022-05-25 NOTE — Evaluation (Signed)
Physical Therapy Evaluation Patient Details Name: Cathy Hensley MRN: [id number] DOB: May 15, 1940 Today's Date: 05/25/2022  History of Present Illness  82 y.o. female brought to ED by family for 2-3 days of disorientation and dysuria. HR in 80s BP in 140s and breathing room air. Chest x-ray showed hyperinflation and bronchitic changes, no pulm edema. Admitted for observation of UTI, severe COPD, HTN with bilateral LE edema. PMH significant for alpha-1 antitrypsin deficiency, past history of smoking, severe COPD, chronic hypercarbic respiratory failure on 2.5 to 4 L of oxygen at home  Clinical Impression  PTA pt living with husband in 2 story home with 3 steps to enter and bed and bath upstairs. Pt ambulated household distances with increased UE support but no AD. Pt has HHAide 10-4pm 5days/wk to assist with bathing, dressing, cooking and light housekeeping. Pt has had a decreased in mobility in the last week, including refusal of going upstairs to take a shower or sleep, preferring to sleep on the couch downstairs. Pt has history of short term memory loss, but this too has been worse. Pt is currently mod A for bed mobility and maxA for steadying with transfers. Given level of confusion pt will likely do better in her own home with increased assistance. PT will benefit from continued acute and post-acute PT and referral to Mobility Specialist. Daughter voices understanding.        Recommendations for follow up therapy are one component of a multi-disciplinary discharge planning process, led by the attending physician.  Recommendations may be updated based on patient status, additional functional criteria and insurance authorization.     Assistance Recommended at Discharge Frequent or constant Supervision/Assistance  Patient can return home with the following  A lot of help with walking and/or transfers;A lot of help with bathing/dressing/bathroom;Assistance with cooking/housework;Assistance with  feeding;Direct supervision/assist for medications management;Direct supervision/assist for financial management;Assist for transportation;Help with stairs or ramp for entrance    Equipment Recommendations Hospital bed;Wheelchair (measurements PT);Wheelchair cushion (measurements PT);BSC/3in1  Recommendations for Other Services  OT consult    Functional Status Assessment Patient has had a recent decline in their functional status and demonstrates the ability to make significant improvements in function in a reasonable and predictable amount of time.     Precautions / Restrictions Precautions Precautions: Fall Restrictions Weight Bearing Restrictions: No      Mobility  Bed Mobility Overal bed mobility: Needs Assistance Bed Mobility: Rolling, Sidelying to Sit Rolling: Mod assist Sidelying to sit: Min assist       General bed mobility comments: modA for rolling onto her side from bed pan, decreased ability to understand that she is on bed pan, does not follow step by step commands to come to EoB, but when directed to come to EoB she is able to with min A for elevation of trunk    Transfers Overall transfer level: Needs assistance Equipment used: 1 person hand held assist Transfers: Sit to/from Stand, Bed to chair/wheelchair/BSC Sit to Stand: Mod assist   Step pivot transfers: Max assist       General transfer comment: modA for power to upright with HHA, with stepping to recliner pt with bilateral knee buckling requiring max A for steady, vc for sequencing    Ambulation/Gait               General Gait Details: deferred due to weakness      Balance Overall balance assessment: Needs assistance Sitting-balance support: Feet supported, Bilateral upper extremity supported Sitting balance-Leahy Scale: Poor Sitting  balance - Comments: daughter provides light assist through out   Standing balance support: Bilateral upper extremity supported, During functional activity,  Single extremity supported Standing balance-Leahy Scale: Zero                               Pertinent Vitals/Pain Pain Assessment Pain Assessment: Faces Faces Pain Scale: Hurts a little bit Pain Location: generalized with movement Pain Descriptors / Indicators: Grimacing, Guarding Pain Intervention(s): Limited activity within patient's tolerance, Monitored during session, Repositioned    Home Living Family/patient expects to be discharged to:: Private residence Living Arrangements: Spouse/significant other Available Help at Discharge: Family;Available 24 hours/day Type of Home: House Home Access: Stairs to enter Entrance Stairs-Rails: None Entrance Stairs-Number of Steps: 3 Alternate Level Stairs-Number of Steps: 13 Home Layout: Two level;1/2 bath on main level;Bed/bath upstairs Home Equipment: Shower seat;Hand held shower head      Prior Function Prior Level of Function : Needs assist  Cognitive Assist : ADLs (cognitive);Mobility (cognitive)           Mobility Comments: a week ago pt was able to walk in home without AD, and climb stairs to bathroom for showers, in past days she has not been going upstairs, refusing showers and sleeping on the couch ADLs Comments: has HHAide 09-8/1XBJ/YN assists with bathing, dressing, housework, husband and daughter manage medications     Hand Dominance   Dominant Hand: Right    Extremity/Trunk Assessment   Upper Extremity Assessment Upper Extremity Assessment: Overall WFL for tasks assessed    Lower Extremity Assessment Lower Extremity Assessment: Generalized weakness;Difficult to assess due to impaired cognition    Cervical / Trunk Assessment Cervical / Trunk Assessment: Kyphotic  Communication   Communication: No difficulties  Cognition Arousal/Alertness: Awake/alert Behavior During Therapy: Flat affect Overall Cognitive Status: Impaired/Different from baseline Area of Impairment: Orientation, Attention,  Memory, Following commands, Safety/judgement, Awareness, Problem solving                 Orientation Level: Disoriented to, Place, Situation, Time Current Attention Level: Selective Memory: Decreased short-term memory Following Commands: Follows one step commands consistently, Follows multi-step commands inconsistently, Follows one step commands with increased time Safety/Judgement: Decreased awareness of deficits, Decreased awareness of safety Awareness: Intellectual Problem Solving: Slow processing, Difficulty sequencing, Decreased initiation, Requires verbal cues, Requires tactile cues General Comments: pt family reports 4 days of decreased cognition on top of gradual decline in cognition, pt with decreased desire for personal care (showers), eating and mobilizing        General Comments General comments (skin integrity, edema, etc.): Pt daughter and husband present, daughter provides most of the information, VSS on 8L O2 via HFNC        Assessment/Plan    PT Assessment Patient needs continued PT services  PT Problem List Decreased strength;Decreased activity tolerance;Decreased balance;Decreased mobility;Decreased coordination;Decreased cognition;Decreased knowledge of use of DME;Decreased safety awareness;Cardiopulmonary status limiting activity       PT Treatment Interventions DME instruction;Gait training;Stair training;Functional mobility training;Therapeutic activities;Therapeutic exercise;Balance training;Cognitive remediation;Patient/family education    PT Goals (Current goals can be found in the Care Plan section)  Acute Rehab PT Goals Patient Stated Goal: to walk better PT Goal Formulation: With patient/family Time For Goal Achievement: 06/08/22 Potential to Achieve Goals: Fair    Frequency Min 1X/week        AM-PAC PT "6 Clicks" Mobility  Outcome Measure Help needed turning from your back to your side while  in a flat bed without using bedrails?: A  Lot Help needed moving from lying on your back to sitting on the side of a flat bed without using bedrails?: A Lot Help needed moving to and from a bed to a chair (including a wheelchair)?: A Lot Help needed standing up from a chair using your arms (e.g., wheelchair or bedside chair)?: A Lot Help needed to walk in hospital room?: Total Help needed climbing 3-5 steps with a railing? : Total 6 Click Score: 10    End of Session Equipment Utilized During Treatment: Gait belt;Oxygen Activity Tolerance: Patient tolerated treatment well Patient left: in chair;with call bell/phone within reach;with chair alarm set;with family/visitor present Nurse Communication: Mobility status;Other (comment) (inquired into Palliative consult) PT Visit Diagnosis: Muscle weakness (generalized) (M62.81)    Time: 1610-9604 PT Time Calculation (min) (ACUTE ONLY): 37 min   Charges:   PT Evaluation $PT Eval Moderate Complexity: 1 Mod PT Treatments $Therapeutic Activity: 8-22 mins        Soleia Badolato B. Beverely Risen PT, DPT Acute Rehabilitation Services Please use secure chat or  Call Office [phone]   Elon Alas Fleet 05/25/2022, 1:20 PM

## 2022-05-25 NOTE — Progress Notes (Signed)
PROGRESS NOTE  Cathy Hensley  DOB: 1940-10-04  PCP: Deeann Saint, MD ZOX:[id number]  DOA: 05/24/2022  LOS: 0 days  Hospital Day: 2  Brief narrative: Cathy Hensley is a 82 y.o. female with PMH significant for alpha-1 antitrypsin deficiency, past history of smoking, severe COPD, chronic hypercarbic respiratory failure on 2.5 to 4 L of oxygen at home, not on positive pressure ventilation, follows up with pulmonology [name] as an outpatient. Last seen by [name] on 4/29.  No changes made at the time. Also with history of chronic short-term memory loss, hypertension, hyperlipidemia Brought to the ED by family today for 2 to 3 days of disorientation, waxing and waning confusion, incomprehensible speech at times.  Also endorsed dysuria, but no fever.  In the ED, patient was afebrile, heart rate in 80s, blood pressure in 140s, breathing on room air Labs showed a venous blood gas with pH of 7.44, pCO2 80, bicarb 55, troponin mildly elevated 27 Urinalysis with hazy yellow urine with small hemoglobin, negative leukocytes, negative nitrite, rare bacteria Chest x-ray showed hyperinflation and bronchitic changes, no pulm edema CT head did not show any intracranial abnormality, showed moderate chronic small vessel disease. Patient received DuoNebs in the ED Kept in observation to [facility]  Subjective: Patient was seen and examined this morning.  Elderly Caucasian female.  Propped up in bed.  Mostly somnolent throughout my conversation.  Daughter and husband at bedside.   Patient was tried on BiPAP last night but she did not tolerate it. Daughter has noticed her very weak this morning.  Mental status waxing and waning.  Assessment/Plan: Acute metabolic encephalopathy Chronic short memory loss Brought in for disorientation, waxing and waning confusion, incomprehensible speech in the setting of chronic hypercapnia.  Also mentioned dysuria raising possibility of UTI CT head unremarkable. Mental  status continues to wax and wane.  I offered MRI as a neck step but given her age and comorbidities, probably not much yield and likely would be difficult to obtain with her level of mental status..  Family understands and would not like to probe more for now.  UTI Complains of dysuria for last 1 week. Urinalysis unremarkable. Because of UTI symptoms and worsening confusion, she is currently on empiric course of IV Rocephin.   Severe COPD Chronic hypoxic hypercapnic respiratory failure No fever.  Complains of cough with thick sputum.  No infiltrates on chest x-ray PTA on Pulmicort twice daily, Spiriva daily, Brovana twice daily, Singulair daily albuterol as needed and Mucinex DM twice daily. 4/29, seen by pulmonologist.  No changes in breathing medicine at the time. Currently continued on bronchodilators as before. Chronically on 2.5 to 4 L oxygen.  Not on positive pressure ventilation.  Given her severe hypercapnia, I wanted to try BiPAP.  Apparently did not tolerate last night.  At the request of family, we will not try tonight.   Continue empiric IV Rocephin and oral doxycycline  Elevated troponin Mildly elevated troponin likely because of difficulty breathing. Recent Labs    05/24/22 1440 05/24/22 1548  TROPONINIHS 24* 27*   HTN Bilateral lower extremity edema Patient was previously on amlodipine 5 mg daily.  Given the concern of bilateral pedal edema, amlodipine was stopped recently.  I would avoid diuresis given poor oral intake already.  TED hose ordered.  HLD Continue aspirin 81 mg daily and simvastatin  Impaired mobility PT eval ordered  Goals of care   Code Status: DNR.  Discussed with husband and daughter at bedside.  Daughter states that in the past, her pulmonologist [name] had also opened up a discussion about hospice if her condition worsens.  Requested palliative care this morning.   DVT prophylaxis:  enoxaparin (LOVENOX) injection 40 mg Start: 05/24/22  2000 Place TED hose Start: 05/24/22 1843   Antimicrobials: IV Rocephin and oral doxycycline Fluid: None Consultants: None Family Communication: Daughter and husband at bedside  Status: Observation Level of care:  Progressive   Patient from: Home Anticipated d/c to: Pending clinical course Needs to continue in-hospital care:  Mental status not improving.  PT eval pending.   Diet: Diet Order             Diet regular Fluid consistency: Thin  Diet effective now                  Scheduled Meds:  arformoterol  15 mcg Nebulization BID   aspirin  81 mg Oral Daily   budesonide  0.25 mg Nebulization BID   dextromethorphan-guaiFENesin  1 tablet Oral BID   doxycycline  100 mg Oral Q12H   enoxaparin (LOVENOX) injection  40 mg Subcutaneous Q24H   melatonin  3 mg Oral QHS   montelukast  10 mg Oral QHS   senna  1 tablet Oral BID   simvastatin  20 mg Oral q1800   umeclidinium bromide  1 puff Inhalation Daily    PRN meds: acetaminophen **OR** acetaminophen, albuterol, hydrALAZINE, polyethylene glycol   Infusions:   cefTRIAXone (ROCEPHIN)  IV 1 g (05/25/22 1031)    Diet:  Diet Order             Diet regular Fluid consistency: Thin  Diet effective now                   Antimicrobials: Anti-infectives (From admission, onward)    Start     Dose/Rate Route Frequency Ordered Stop   05/24/22 2200  doxycycline (VIBRA-TABS) tablet 100 mg        100 mg Oral Every 12 hours 05/24/22 1844     05/24/22 1845  cefTRIAXone (ROCEPHIN) 1 g in sodium chloride 0.9 % 100 mL IVPB        1 g 200 mL/hr over 30 Minutes Intravenous Every 12 hours 05/24/22 1841 05/27/22 2159       Skin assessment:      Nutritional status:  Body mass index is 16.12 kg/m.          Objective: Vitals:   05/25/22 0700 05/25/22 0843  BP: (!) 154/53   Pulse: 93 92  Resp: (!) 22 17  Temp: 98.5 F (36.9 C)   SpO2: 100% 100%    Intake/Output Summary (Last 24 hours) at 05/25/2022 1032 Last  data filed at 05/25/2022 1000 Gross per 24 hour  Intake 340 ml  Output 0 ml  Net 340 ml   Filed Weights   05/25/22 0141  Weight: 42.6 kg   Weight change:  Body mass index is 16.12 kg/m.   Physical Exam: General exam: Pleasant, cachectic elderly Caucasian female. Skin: No rashes, lesions or ulcers. HEENT: Atraumatic, normocephalic, no obvious bleeding Lungs: Diminished air entry in both bases.  No crackles or wheezing CVS: Regular rate and rhythm, no murmur GI/Abd soft, nontender, nondistended, bowel sound present CNS: Alert, awake, oriented to her name only.  Confused and restless Psychiatry: Confused Extremities: Pedal edema 1+ bilaterally  Data Review: I have personally reviewed the laboratory data and studies available.  F/u labs ordered Unresulted Labs (From admission,  onward)     Start     Ordered   05/26/22 0500  CBC with Differential/Platelet  Tomorrow morning,   R       Question:  Specimen collection method  Answer:  Lab=Lab collect   05/25/22 1032   05/26/22 0500  Basic metabolic panel  Tomorrow morning,   R       Question:  Specimen collection method  Answer:  Lab=Lab collect   05/25/22 1032            Total time spent in review of labs and imaging, patient evaluation, formulation of plan, documentation and communication with family: 45 minutes  Signed, Lorin Glass, MD [facility] 05/25/2022

## 2022-05-25 NOTE — Progress Notes (Signed)
OT Cancellation Note  Patient Details Name: Cathy Hensley MRN: [id number] DOB: 06-02-1940   Cancelled Treatment:    Reason Eval/Treat Not Completed: Patient's level of consciousness.  Pt sleeping and would not arouse to stimulation.  Pt's daughter reports she hasn't slept well, so will re-attempt tomorrow.   Eleora Sutherland OTR/L 05/25/2022, 3:36 PM

## 2022-05-26 DIAGNOSIS — Z7189 Other specified counseling: Secondary | ICD-10-CM

## 2022-05-26 DIAGNOSIS — R410 Disorientation, unspecified: Secondary | ICD-10-CM

## 2022-05-26 DIAGNOSIS — Z515 Encounter for palliative care: Secondary | ICD-10-CM

## 2022-05-26 DIAGNOSIS — J449 Chronic obstructive pulmonary disease, unspecified: Secondary | ICD-10-CM

## 2022-05-26 DIAGNOSIS — Z66 Do not resuscitate: Secondary | ICD-10-CM

## 2022-05-26 LAB — BASIC METABOLIC PANEL
BUN: 11 mg/dL (ref 8–23)
CO2: >45 mmol/L — ABNORMAL HIGH (ref 22–32)
Calcium: 8.6 mg/dL — ABNORMAL LOW (ref 8.9–10.3)
Chloride: 88 mmol/L — ABNORMAL LOW (ref 98–111)
Creatinine, Ser: 0.37 mg/dL — ABNORMAL LOW (ref 0.44–1.00)
GFR, Estimated: >60 mL/min (ref >60)
Glucose, Bld: 75 mg/dL (ref 70–99)
Potassium: 3.9 mmol/L (ref 3.5–5.1)
Sodium: 143 mmol/L (ref 135–145)

## 2022-05-26 LAB — CBC WITH DIFFERENTIAL/PLATELET
Abs Immature Granulocytes: 0.01 10*3/uL (ref 0.00–0.07)
Basophils Absolute: 0 10*3/uL (ref 0.0–0.1)
Basophils Relative: 0 %
Eosinophils Absolute: 0 10*3/uL (ref 0.0–0.5)
Eosinophils Relative: 1 %
HCT: 29.5 % — ABNORMAL LOW (ref 36.0–46.0)
Hemoglobin: 9 g/dL — ABNORMAL LOW (ref 12.0–15.0)
Immature Granulocytes: 0 %
Lymphocytes Relative: 21 %
Lymphs Abs: 0.6 10*3/uL — ABNORMAL LOW (ref 0.7–4.0)
MCH: 32 pg (ref 26.0–34.0)
MCHC: 30.5 g/dL (ref 30.0–36.0)
MCV: 105 fL — ABNORMAL HIGH (ref 80.0–100.0)
Monocytes Absolute: 0.4 10*3/uL (ref 0.1–1.0)
Monocytes Relative: 13 %
Neutro Abs: 1.9 10*3/uL (ref 1.7–7.7)
Neutrophils Relative %: 65 %
Platelets: 103 10*3/uL — ABNORMAL LOW (ref 150–400)
RBC: 2.81 MIL/uL — ABNORMAL LOW (ref 3.87–5.11)
RDW: 12.6 % (ref 11.5–15.5)
WBC: 3 10*3/uL — ABNORMAL LOW (ref 4.0–10.5)
nRBC: 0 % (ref 0.0–0.2)
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
## 2022-05-26 NOTE — Evaluation (Signed)
Occupational Therapy Evaluation Patient Details Name: Cathy Hensley MRN: [id number] DOB: 1940/02/25 Today's Date: 05/26/2022   History of Present Illness 82 y.o. female brought to ED by family for 2-3 days of disorientation and dysuria. HR in 80s BP in 140s and breathing room air. Chest x-ray showed hyperinflation and bronchitic changes, no pulm edema. Admitted for observation of UTI, severe COPD, HTN with bilateral LE edema. PMH significant for alpha-1 antitrypsin deficiency, past history of smoking, severe COPD, chronic hypercarbic respiratory failure on 2.5 to 4 L of oxygen at home   Clinical Impression   Pt walks without assist in her home, but has needed hand held assist in the days leading up to admission. Pt is dependent in bathing, dressing and all IADLs by her caregiver. She lives with her husband. Pt presents with impaired cognition, may be near baseline, decreased activity tolerance, generalized weakness and impaired standing balance. She ambulate on 5L O2 this date with RW and min guard assist. Her family would prefer she use a RW at home. Will follow acutely. Do not anticipate pt will need post acute OT.      Recommendations for follow up therapy are one component of a multi-disciplinary discharge planning process, led by the attending physician.  Recommendations may be updated based on patient status, additional functional criteria and insurance authorization.   Assistance Recommended at Discharge Frequent or constant Supervision/Assistance  Patient can return home with the following A little help with walking and/or transfers;A lot of help with bathing/dressing/bathroom;Assistance with cooking/housework;Direct supervision/assist for medications management;Direct supervision/assist for financial management;Assist for transportation;Help with stairs or ramp for entrance    Functional Status Assessment  Patient has had a recent decline in their functional status and demonstrates the  ability to make significant improvements in function in a reasonable and predictable amount of time.  Equipment Recommendations   (RW)    Recommendations for Other Services       Precautions / Restrictions Precautions Precautions: Fall Precaution Comments: 2.5-3L at baseline Restrictions Weight Bearing Restrictions: No      Mobility Bed Mobility               General bed mobility comments: in chair    Transfers Overall transfer level: Needs assistance Equipment used: Rolling walker (2 wheels) Transfers: Sit to/from Stand Sit to Stand: Min assist           General transfer comment: cues for hand placement, min assist to rise and steady, progressed to min guard      Balance Overall balance assessment: Needs assistance   Sitting balance-Leahy Scale: Good       Standing balance-Leahy Scale: Poor                             ADL either performed or assessed with clinical judgement   ADL Overall ADL's : Needs assistance/impaired Eating/Feeding: Set up;Sitting   Grooming: Min guard;Standing;Wash/dry Electrical engineer Transfer: Min guard;Ambulation;Rolling walker (2 wheels)           Functional mobility during ADLs: Min guard;Rolling walker (2 wheels) General ADL Comments: family would like pt to use RW     Vision Ability to See in Adequate Light: 0 Adequate Patient Visual Report: No change from baseline       Perception     Praxis      Pertinent Vitals/Pain  Pain Assessment Pain Assessment: No/denies pain     Hand Dominance Right   Extremity/Trunk Assessment Upper Extremity Assessment Upper Extremity Assessment: Generalized weakness   Lower Extremity Assessment Lower Extremity Assessment: Defer to PT evaluation   Cervical / Trunk Assessment Cervical / Trunk Assessment: Kyphotic   Communication Communication Communication: No difficulties   Cognition Arousal/Alertness: Awake/alert Behavior During  Therapy: Flat affect Overall Cognitive Status: History of cognitive impairments - at baseline                                 General Comments: alert, family reports cognition is in and out     General Comments       Exercises     Shoulder Instructions      Home Living Family/patient expects to be discharged to:: Private residence Living Arrangements: Spouse/significant other Available Help at Discharge: Family;Available 24 hours/day;Personal care attendant Type of Home: House Home Access: Stairs to enter [employer] of Steps: 3 Entrance Stairs-Rails: None Home Layout: Two level;1/2 bath on main level;Bed/bath upstairs Alternate Level Stairs-Number of Steps: 13 Alternate Level Stairs-Rails: Left Bathroom Shower/Tub: Chief Strategy Officer: Standard     Home Equipment: Shower seat;Hand held shower head          Prior Functioning/Environment Prior Level of Function : Needs assist             Mobility Comments: a week ago pt was able to walk in home without AD, and climb stairs to bathroom for showers, in past days she has not been going upstairs, refusing showers and sleeping on the couch ADLs Comments: has HHAide 95-6/2ZHY/QM assists with bathing, dressing, housework, husband and daughter manage medications, family is increasing to over weekends too        OT Problem List: Decreased strength;Decreased activity tolerance;Impaired balance (sitting and/or standing);Decreased cognition;Decreased safety awareness;Decreased knowledge of use of DME or AE      OT Treatment/Interventions: Self-care/ADL training;DME and/or AE instruction;Patient/family education;Balance training;Therapeutic activities    OT Goals(Current goals can be found in the care plan section) Acute Rehab OT Goals OT Goal Formulation: With patient/family Time For Goal Achievement: 06/09/22 Potential to Achieve Goals: Good ADL Goals Pt Will Perform Grooming: with  supervision;standing (2 activities) Pt Will Transfer to Toilet: with supervision;ambulating Pt Will Perform Toileting - Clothing Manipulation and hygiene: with supervision;sit to/from stand  OT Frequency: Min 2X/week    Co-evaluation              AM-PAC OT "6 Clicks" Daily Activity     Outcome Measure Help from another person eating meals?: A Little Help from another person taking care of personal grooming?: A Little Help from another person toileting, which includes using toliet, bedpan, or urinal?: A Lot Help from another person bathing (including washing, rinsing, drying)?: A Lot Help from another person to put on and taking off regular upper body clothing?: A Lot Help from another person to put on and taking off regular lower body clothing?: A Lot 6 Click Score: 14   End of Session Equipment Utilized During Treatment: Gait belt;Rolling walker (2 wheels);Oxygen Nurse Communication: Mobility status  Activity Tolerance: Patient tolerated treatment well Patient left: in chair;with call bell/phone within reach;with chair alarm set;with family/visitor present  OT Visit Diagnosis: Other abnormalities of gait and mobility (R26.89);Unsteadiness on feet (R26.81);Muscle weakness (generalized) (M62.81);Other symptoms and signs involving cognitive function  Time: 1610-9604 OT Time Calculation (min): 36 min Charges:  OT General Charges $OT Visit: 1 Visit OT Evaluation $OT Eval Moderate Complexity: 1 Mod OT Treatments $Self Care/Home Management : 8-22 mins  Berna Spare, OTR/L Acute Rehabilitation Services Office: [phone]   Evern Bio 05/26/2022, 12:24 PM

## 2022-05-26 NOTE — Progress Notes (Signed)
PROGRESS NOTE  Cathy Hensley  DOB: January 12, 1940  PCP: Deeann Saint, MD ZOX:[id number]  DOA: 05/24/2022  LOS: 1 day  Hospital Day: 3  Brief narrative: Cathy Hensley is a 82 y.o. female with PMH significant for alpha-1 antitrypsin deficiency, past history of smoking, severe COPD, chronic hypercarbic respiratory failure on 2.5 to 4 L of oxygen at home, not on positive pressure ventilation, follows up with pulmonology [name] as an outpatient. Last seen by [name] on 4/29.  No changes made at the time. Also with history of chronic short-term memory loss, hypertension, hyperlipidemia Brought to the ED by family today for 2 to 3 days of disorientation, waxing and waning confusion, incomprehensible speech at times.  Also endorsed dysuria, but no fever.  In the ED, patient was afebrile, heart rate in 80s, blood pressure in 140s, breathing on room air Labs showed a venous blood gas with pH of 7.44, pCO2 80, bicarb 55, troponin mildly elevated 27 Urinalysis with hazy yellow urine with small hemoglobin, negative leukocytes, negative nitrite, rare bacteria Chest x-ray showed hyperinflation and bronchitic changes, no pulm edema CT head did not show any intracranial abnormality, showed moderate chronic small vessel disease. Patient received DuoNebs in the ED Kept in observation to [facility]  Subjective: Patient was seen and examined this morning. Daughter and RN were able to take her out of bed to recliner this morning. More awake, alert, able to answer simple yes/no questions.   Daughter at bedside and excited with the progress.  Assessment/Plan: Acute metabolic encephalopathy Chronic short memory loss Brought in for disorientation, waxing and waning confusion, incomprehensible speech in the setting of chronic hypercapnia.  Also mentioned dysuria raising possibility of UTI CT head unremarkable. Mental status continues to wax and wane. I offered MRI as the next step but given her age and  comorbidities, probably not much yield. Likely would be difficult to obtain with her level of mental status..  Family understands and would not like to probe more for now.  UTI Complains of dysuria for last 1 week. Urinalysis unremarkable. Because of UTI symptoms and worsening confusion, she is currently on empiric course of IV Rocephin.   Severe COPD Chronic hypoxic hypercapnic respiratory failure No fever.  Complains of cough with thick sputum.  No infiltrates on chest x-ray PTA on Pulmicort twice daily, Spiriva daily, Brovana twice daily, Singulair daily albuterol as needed and Mucinex DM twice daily. 4/29, seen by pulmonologist.  No changes in breathing medicine at the time. Currently continued on bronchodilators as before. Chronically on 2.5 to 4 L oxygen. Not on positive pressure ventilation.  Given her severe hypercapnia, I wanted to try BiPAP.  However, patient did not tolerate.   Continue empiric IV Rocephin and oral doxycycline  Elevated troponin Mildly elevated troponin likely because of difficulty breathing. Recent Labs    05/24/22 1440 05/24/22 1548  TROPONINIHS 24* 27*   HTN Bilateral lower extremity edema Patient was previously on amlodipine 5 mg daily.  Given the concern of bilateral pedal edema, amlodipine was stopped recently.  I would avoid diuresis given poor oral intake already.  TED hose ordered.  HLD Continue aspirin 81 mg daily and simvastatin  Impaired mobility PT eval ordered.  Home with PT ordered. Ordered for Hospital bed, Wheelchair (measurements PT), Wheelchair cushion (measurements PT), BSC/3in1   Goals of care   Code Status: DNR.  Discussed with husband and daughter at bedside.  Daughter states that in the past, her pulmonologist [name] had also  opened up a discussion about hospice if her condition worsens.  Requested palliative care this morning.   DVT prophylaxis:  enoxaparin (LOVENOX) injection 40 mg Start: 05/24/22 2000 Place TED hose  Start: 05/24/22 1843   Antimicrobials: IV Rocephin and oral doxycycline Fluid: None Consultants: None Family Communication: Daughter at bedside  Status: Observation Level of care:  Progressive   Patient from: Home Anticipated d/c to: Pending clinical course Needs to continue in-hospital care:  Physical and mental status improving.  Family making arrangements.  Tentative plan to discharge home tomorrow with home health   Diet: Diet Order             Diet regular Fluid consistency: Thin  Diet effective now                  Scheduled Meds:  arformoterol  15 mcg Nebulization BID   aspirin  81 mg Oral Daily   budesonide  0.25 mg Nebulization BID   dextromethorphan-guaiFENesin  1 tablet Oral BID   doxycycline  100 mg Oral Q12H   enoxaparin (LOVENOX) injection  40 mg Subcutaneous Q24H   melatonin  3 mg Oral QHS   montelukast  10 mg Oral QHS   senna  1 tablet Oral BID   simvastatin  20 mg Oral q1800   umeclidinium bromide  1 puff Inhalation Daily    PRN meds: acetaminophen **OR** acetaminophen, albuterol, hydrALAZINE, polyethylene glycol   Infusions:   cefTRIAXone (ROCEPHIN)  IV 1 g (05/26/22 0844)    Diet:  Diet Order             Diet regular Fluid consistency: Thin  Diet effective now                   Antimicrobials: Anti-infectives (From admission, onward)    Start     Dose/Rate Route Frequency Ordered Stop   05/24/22 2200  doxycycline (VIBRA-TABS) tablet 100 mg        100 mg Oral Every 12 hours 05/24/22 1844     05/24/22 1845  cefTRIAXone (ROCEPHIN) 1 g in sodium chloride 0.9 % 100 mL IVPB        1 g 200 mL/hr over 30 Minutes Intravenous Every 12 hours 05/24/22 1841 05/27/22 2159       Skin assessment:      Nutritional status:  Body mass index is 16.12 kg/m.          Objective: Vitals:   05/26/22 0803 05/26/22 1320  BP:    Pulse:    Resp:  16  Temp:  98.5 F (36.9 C)  SpO2: 92%     Intake/Output Summary (Last 24 hours) at  05/26/2022 1359 Last data filed at 05/26/2022 1300 Gross per 24 hour  Intake 598 ml  Output 800 ml  Net -202 ml   Filed Weights   05/25/22 0141  Weight: 42.6 kg   Weight change:  Body mass index is 16.12 kg/m.   Physical Exam: General exam: Pleasant, cachectic elderly Caucasian female. Skin: No rashes, lesions or ulcers. HEENT: Atraumatic, normocephalic, no obvious bleeding Lungs: Diminished air entry in both bases.  No crackles or wheezing CVS: Regular rate and rhythm, no murmur GI/Abd soft, nontender, nondistended, bowel sound present CNS: Alert, awake, oriented.  Able to answer simple yes/no questions.   Psychiatry: Mood appropriate Extremities: No pedal edema no calf tenderness Data Review: I have personally reviewed the laboratory data and studies available.  F/u labs ordered Unresulted Labs (From admission, onward)  None       Total time spent in review of labs and imaging, patient evaluation, formulation of plan, documentation and communication with family: 45 minutes  Signed, Lorin Glass, MD [facility] 05/26/2022

## 2022-05-27 DIAGNOSIS — R410 Disorientation, unspecified: Secondary | ICD-10-CM | POA: Diagnosis not present

## 2022-05-27 NOTE — TOC Initial Note (Signed)
Transition of Care [facility]) - Initial/Assessment Note    Patient Details  Name: Cathy Hensley MRN: [id number] Date of Birth: 04-09-1940  Transition of Care [facility]) CM/SW Contact:    Gala Lewandowsky, RN Phone Number: 05/27/2022, 10:48 AM  Clinical Narrative:  Late Entry: Patient presented for confusion and disorientation. Patient had family @ the bedside during the visit. Daughter [name] is contact [phone]. PTA patient was from home with spouse. Disposition plan discussed with the family and they are currently working on personal care for 24 hour supervision. Patient currently receives services from [facility] Mon-Friday 10:00 am-4:00 pm. Additional personal care brochures provided as well. Palliative had spoken with the family and they are agreeable to outpatient palliative services- Referral submitted to [employer]. Agency will contact the patient for eligibility. Case Manager discussed home health services RN/PT/OT and reviewed the Medicare.gov list: first choice was [facility] and second choice [facility]. [facility] is able to accept the patient and family is agreeable with start of care being next Tuesday 5-28 2/2 the Memorial Day weekend and staffing. Case Manager will follow for orders. Per [name], the patient will return to her home at [address] [place] [address]. [name] with [facility] is aware and will reach out to the family. DME discussed Hospital bed, Wheelchair, [employer], and Bedside [employer]. Family is agreeable to DME referral via [employer]. All DME except for the bed will be delivered to the hospital room on 05-27-22. Family asked that bed be delivered on 05-28-22 due to making room for the bed. Patient has oxygen via [employer] at 2.5 liters-patient is currently on 5 liters and if the patient continues to need an increased liter flow- new orders will need to be submitted to the company. [employer] is aware that the patients family is asking for  humidification. Case Manager will continue to follow for transition of care needs as the patient progresses.            Expected Discharge Plan: Home w Home Health Services Barriers to Discharge: No Barriers Identified   Patient Goals and CMS Choice Patient states their goals for this hospitalization and ongoing recovery are:: to return home CMS Medicare.gov Compare Post Acute Care list provided to:: Patient Choice offered to / list presented to : Adult Children      Expected Discharge Plan and Services   Discharge Planning Services: CM Consult Post Acute Care Choice: Home Health, Durable Medical Equipment Living arrangements for the past 2 months:  (townhouse)                 DME Arranged: Hospital bed, High strength lightweight manual wheelchair with seat cushion, Walker rolling, Bedside commode DME Agency: [employer] Date DME Agency Contacted: 05/26/22 Time DME Agency Contacted: 1500 Representative spoke with at DME Agency: Vaughan Basta HH Arranged: RN, Disease Management, PT, OT HH Agency: [facility] Date [facility] Agency Contacted: 05/26/22 Time HH Agency Contacted: 1530 Representative spoke with at [facility] Agency: [name]  Prior Living Arrangements/Services Living arrangements for the past 2 months:  (townhouse) Lives with:: Spouse Patient language and need for interpreter reviewed:: Yes Do you feel safe going back to the place where you live?: Yes      Need for Family Participation in Patient Care: Yes (Comment) Care giver support system in place?: Yes (comment) Current home services: Homehealth aide (Personal Caregivers from [facility]: Mon- Friday 10:00 am-4:00 pm.) Criminal Activity/Legal Involvement Pertinent to Current Situation/Hospitalization: No - Comment as needed  Activities of Daily Living Home Assistive Devices/Equipment: Oxygen ADL Screening (condition at time of admission) Patient's cognitive ability adequate to safely complete daily  activities?: No Is the patient deaf or have difficulty hearing?: No Does the patient have difficulty seeing, even when wearing glasses/contacts?: No Does the patient have difficulty concentrating, remembering, or making decisions?: Yes Patient able to express need for assistance with ADLs?: Yes Does the patient have difficulty dressing or bathing?: Yes Independently performs ADLs?: No Communication: Needs assistance Is this a change from baseline?: Pre-admission baseline Dressing (OT): Needs assistance Is this a change from baseline?: Pre-admission baseline Grooming: Needs assistance Is this a change from baseline?: Pre-admission baseline Feeding: Independent Bathing: Needs assistance Is this a change from baseline?: Pre-admission baseline Toileting: Needs assistance Is this a change from baseline?: Pre-admission baseline In/Out Bed: Needs assistance Is this a change from baseline?: Pre-admission baseline Walks in Home: Needs assistance Is this a change from baseline?: Pre-admission baseline Does the patient have difficulty walking or climbing stairs?: Yes Weakness of Legs: Both Weakness of Arms/Hands: None  Permission Sought/Granted Permission sought to share information with : Family Supports, Case Manager Permission granted to share information with : Yes, Verbal Permission Granted     Permission granted to share info w AGENCY: [facility], [facility], [facility]        Emotional Assessment Appearance:: Appears stated age Attitude/Demeanor/Rapport: Engaged Affect (typically observed): Appropriate Orientation: : Oriented to Self, Oriented to Place Alcohol / Substance Use: Not Applicable Psych Involvement: No (comment)  Admission diagnosis:  Acute delirium [R41.0] Altered mental status, unspecified altered mental status type [R41.82] Chronic obstructive pulmonary disease, unspecified COPD type (HCC) [J44.9] Acute metabolic encephalopathy [G93.41] Patient Active Problem List    Diagnosis Date Noted   Palliative care by specialist 05/26/2022   Goals of care, counseling/discussion 05/26/2022   DNR (do not resuscitate) 05/26/2022   Chronic obstructive pulmonary disease (HCC) 05/26/2022   Acute delirium 05/24/2022   Acute metabolic encephalopathy 05/24/2022   Edema both LE's 05/04/2022   Lung nodule 05/11/2021   Syncope 03/09/2019   CAP (community acquired pneumonia) 12/07/2016   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 01/22/2015   Chronic respiratory failure with hypoxia and hypercapnia (HCC) 09/24/2014   Protein calorie malnutrition (HCC)    COPD exacerbation (HCC)    Dyslipidemia 05/03/2011   Other and unspecified hyperlipidemia 07/13/2006   Essential hypertension 07/13/2006   COPD GOLD IV  07/13/2006   PCP:  Deeann Saint, MD Pharmacy:   [facility] [facility] [address] - [place], [place] - [address] [facility] [place] [address] Phone: [phone] Fax: [phone]  [id number] [place],  - [address] [facility] [address] [facility] [address] [place] [address] Phone: [phone] Fax: [phone]  [facility] - [place], [place] - [address] [facility] [facility] [place]. [address] [employer]. [address] [place] [address] Phone: [phone] Fax: [phone]     Social Determinants of Health (SDOH) Social History: SDOH Screenings   Food Insecurity: No Food Insecurity (05/24/2022)  Housing: Patient Declined (05/24/2022)  Transportation Needs: No Transportation Needs (05/24/2022)  Utilities: Not At Risk (05/24/2022)  Alcohol Screen: Low Risk  (01/28/2022)  Depression (PHQ2-9): High Risk (03/25/2022)  Financial Resource Strain: Low Risk  (01/28/2022)  Physical Activity: Inactive (01/28/2022)  Social Connections: Moderately Isolated (01/28/2022)  Stress: No Stress Concern Present (01/28/2022)  Tobacco Use: Medium Risk (05/24/2022)   SDOH Interventions:      Readmission  Risk Interventions     No data to display

## 2022-05-27 NOTE — Progress Notes (Signed)
Physical Therapy Treatment Patient Details Name: Cathy Hensley MRN: [id number] DOB: 1940/03/11 Today's Date: 05/27/2022   History of Present Illness 82 y.o. female brought to ED by family for 2-3 days of disorientation and dysuria. HR in 80s BP in 140s and breathing room air. Chest x-ray showed hyperinflation and bronchitic changes, no pulm edema. Admitted for observation of UTI, severe COPD, HTN with bilateral LE edema. PMH significant for alpha-1 antitrypsin deficiency, past history of smoking, severe COPD, chronic hypercarbic respiratory failure on 2.5 to 4 L of oxygen at home    PT Comments    Pt was seen for progression to walk with O2 in place at 3L and observed O2 sats to be 95% or better.  Pt is a bit uncoordinated with managing her walker but according to previous therapy sessions this was the case.  Will recommend her to inpt therapy as previously noted, with equipment guidelines below.  PT goals for acute care are also noted on POC, and will continue to work on these needs.   Recommendations for follow up therapy are one component of a multi-disciplinary discharge planning process, led by the attending physician.  Recommendations may be updated based on patient status, additional functional criteria and insurance authorization.  Follow Up Recommendations       Assistance Recommended at Discharge Frequent or constant Supervision/Assistance  Patient can return home with the following A lot of help with walking and/or transfers;A lot of help with bathing/dressing/bathroom;Assistance with cooking/housework;Assistance with feeding;Direct supervision/assist for medications management;Direct supervision/assist for financial management;Assist for transportation;Help with stairs or ramp for entrance   Equipment Recommendations  Hospital bed;Wheelchair (measurements PT);Wheelchair cushion (measurements PT);BSC/3in1    Recommendations for Other Services       Precautions / Restrictions  Precautions Precautions: Fall Precaution Comments: on 3L baseline Restrictions Weight Bearing Restrictions: No Other Position/Activity Restrictions: monitor sats     Mobility  Bed Mobility               General bed mobility comments: up in chair    Transfers Overall transfer level: Needs assistance Equipment used: Rolling walker (2 wheels) Transfers: Sit to/from Stand Sit to Stand: Min assist           General transfer comment: reminders for hand placement but does better more automatically    Ambulation/Gait Ambulation/Gait assistance: Min assist Gait Distance (Feet): 40 Feet Assistive device: Rolling walker (2 wheels), 1 person hand held assist Gait Pattern/deviations: Step-to pattern, Step-through pattern, Decreased stride length, Wide base of support Gait velocity: reduced Gait velocity interpretation: <1.31 ft/sec, indicative of household ambulator Pre-gait activities: standing balance ck General Gait Details: directions are an issue, tends to be unfocused on goal despite instruction   Stairs             Wheelchair Mobility    Modified Rankin (Stroke Patients Only)       Balance Overall balance assessment: Needs assistance Sitting-balance support: Feet supported, Bilateral upper extremity supported Sitting balance-Leahy Scale: Good     Standing balance support: Bilateral upper extremity supported, During functional activity Standing balance-Leahy Scale: Poor                              Cognition Arousal/Alertness: Awake/alert Behavior During Therapy: Flat affect Overall Cognitive Status: History of cognitive impairments - at baseline Area of Impairment: Problem solving, Safety/judgement, Awareness, Following commands, Attention  Current Attention Level: Selective Memory: Decreased short-term memory, Decreased recall of precautions Following Commands: Follows one step commands with increased  time Safety/Judgement: Decreased awareness of deficits, Decreased awareness of safety Awareness: Intellectual Problem Solving: Slow processing, Difficulty sequencing, Decreased initiation, Requires verbal cues, Requires tactile cues General Comments: pt is alternating attending to obstacles and forgetting instructions        Exercises General Exercises - Lower Extremity Ankle Circles/Pumps: AROM, 5 reps Quad Sets: AROM, 10 reps Gluteal Sets: AROM, 10 reps    General Comments General comments (skin integrity, edema, etc.): Pt was getting up to walk with help to balance, directions for path needed continually as well as recentering in walker with control of sats throughout.      Pertinent Vitals/Pain Pain Assessment Pain Assessment: Faces Faces Pain Scale: Hurts a little bit Pain Location: general soreness    Home Living                          Prior Function            PT Goals (current goals can now be found in the care plan section) Acute Rehab PT Goals Patient Stated Goal: get stronger Progress towards PT goals: Progressing toward goals    Frequency    Min 1X/week      PT Plan Current plan remains appropriate    Co-evaluation              AM-PAC PT "6 Clicks" Mobility   Outcome Measure  Help needed turning from your back to your side while in a flat bed without using bedrails?: A Lot Help needed moving from lying on your back to sitting on the side of a flat bed without using bedrails?: A Lot Help needed moving to and from a bed to a chair (including a wheelchair)?: A Little Help needed standing up from a chair using your arms (e.g., wheelchair or bedside chair)?: A Little Help needed to walk in hospital room?: A Lot Help needed climbing 3-5 steps with a railing? : Total 6 Click Score: 13    End of Session Equipment Utilized During Treatment: Gait belt;Oxygen Activity Tolerance: Patient tolerated treatment well Patient left: in  chair;with call bell/phone within reach;with chair alarm set;with family/visitor present Nurse Communication: Mobility status;Other (comment) PT Visit Diagnosis: Muscle weakness (generalized) (M62.81)     Time: 1610-9604 PT Time Calculation (min) (ACUTE ONLY): 38 min  Charges:  $Gait Training: 8-22 mins $Therapeutic Exercise: 8-22 mins $Therapeutic Activity: 8-22 mins           Ivar Drape 05/27/2022, 9:03 PM  Samul Dada, PT PhD Acute Rehab Dept. Number: [facility] [id number] and [facility] [phone]

## 2022-05-27 NOTE — Plan of Care (Signed)

## 2022-05-27 NOTE — Progress Notes (Signed)
PROGRESS NOTE  Cathy Hensley  DOB: October 06, 1940  PCP: Deeann Saint, MD ZOX:[id number]  DOA: 05/24/2022  LOS: 2 days  Hospital Day: 4  Brief narrative: Cathy Hensley is a 82 y.o. female with PMH significant for alpha-1 antitrypsin deficiency, past history of smoking, severe COPD, chronic hypercarbic respiratory failure on 2.5 to 4 L of oxygen at home, not on positive pressure ventilation, follows up with pulmonology [name] as an outpatient. Last seen by [name] on 4/29.  No changes made at the time. Also with history of chronic short-term memory loss, hypertension, hyperlipidemia Brought to the ED by family today for 2 to 3 days of disorientation, waxing and waning confusion, incomprehensible speech at times.  Also endorsed dysuria, but no fever.  In the ED, patient was afebrile, heart rate in 80s, blood pressure in 140s, breathing on room air Labs showed a venous blood gas with pH of 7.44, pCO2 80, bicarb 55, troponin mildly elevated 27 Urinalysis with hazy yellow urine with small hemoglobin, negative leukocytes, negative nitrite, rare bacteria Chest x-ray showed hyperinflation and bronchitic changes, no pulm edema CT head did not show any intracranial abnormality, showed moderate chronic small vessel disease. Patient received DuoNebs in the ED Kept in observation to [facility]  Subjective: Patient was seen and examined this morning. Propped up in bed.  Alert, awake, looks more sleepy today.  Patient's daughter Selena Batten at bedside. She is concerned about her physical strength, nocturnal hypoxia They are waiting for DME arrangements at home. They are planning to take her to one of the daughters home tomorrow.  Assessment/Plan: Acute metabolic encephalopathy Chronic short memory loss Brought in for disorientation, waxing and waning confusion, incomprehensible speech in the setting of chronic hypercapnia.  Also mentioned dysuria raising possibility of UTI CT head unremarkable. Mental  status gradually improved than at presentation.  But she continues to have episodes of confusion, hallucinations due to dementia.  UTI Complains of dysuria for last 1 week. Urinalysis unremarkable. Because of UTI symptoms and worsening confusion, she is currently on empiric course of IV Rocephin.   Severe COPD Chronic hypoxic hypercapnic respiratory failure No fever.  Complains of cough with thick sputum.  No infiltrates on chest x-ray PTA on Pulmicort twice daily, Spiriva daily, Brovana twice daily, Singulair daily albuterol as needed and Mucinex DM twice daily. 4/29, seen by pulmonologist.  No changes in breathing medicine at the time. Currently continued on bronchodilators as before. Chronically on 2.5 to 4 L oxygen. Not on positive pressure ventilation.  Given her severe hypercapnia, I wanted to try BiPAP.  However, patient did not tolerate.   Continue empiric IV Rocephin and oral doxycycline  Elevated troponin Mildly elevated troponin likely because of difficulty breathing. Recent Labs    05/24/22 1440 05/24/22 1548  TROPONINIHS 24* 27*    HTN Bilateral lower extremity edema Patient was previously on amlodipine 5 mg daily.  Given the concern of bilateral pedal edema, amlodipine was stopped recently.  I would avoid diuresis given poor oral intake already.  TED hose ordered.  HLD Continue aspirin 81 mg daily and simvastatin  Impaired mobility PT eval ordered.  Home with PT ordered. Ordered for Hospital bed, Wheelchair (measurements PT), Wheelchair cushion (measurements PT), BSC/3in1   Goals of care   Code Status: DNR.  Discussed with husband and daughter at bedside.  Daughter states that in the past, her pulmonologist [name] had also opened up a discussion about hospice if her condition worsens.  Currently care consultation  was obtained.  Family to continue with PT post discharge.  They have been given resources to contact hospice if patient deteriorates at home.   DVT  prophylaxis:  enoxaparin (LOVENOX) injection 40 mg Start: 05/24/22 2000 Place TED hose Start: 05/24/22 1843   Antimicrobials: IV Rocephin and oral doxycycline Fluid: None Consultants: None Family Communication: Daughter at bedside  Status: Observation Level of care:  Progressive   Patient from: Home Anticipated d/c to: Pending clinical course Needs to continue in-hospital care:  Physical and mental status improving.  Family making arrangements.  Tentative plan to discharge home tomorrow with home health   Diet: Diet Order             Diet regular Fluid consistency: Thin  Diet effective now                  Scheduled Meds:  arformoterol  15 mcg Nebulization BID   aspirin  81 mg Oral Daily   budesonide  0.25 mg Nebulization BID   dextromethorphan-guaiFENesin  1 tablet Oral BID   doxycycline  100 mg Oral Q12H   enoxaparin (LOVENOX) injection  40 mg Subcutaneous Q24H   melatonin  3 mg Oral QHS   montelukast  10 mg Oral QHS   senna  1 tablet Oral BID   simvastatin  20 mg Oral q1800   umeclidinium bromide  1 puff Inhalation Daily    PRN meds: acetaminophen **OR** acetaminophen, albuterol, hydrALAZINE, polyethylene glycol   Infusions:     Diet:  Diet Order             Diet regular Fluid consistency: Thin  Diet effective now                   Antimicrobials: Anti-infectives (From admission, onward)    Start     Dose/Rate Route Frequency Ordered Stop   05/24/22 2200  doxycycline (VIBRA-TABS) tablet 100 mg        100 mg Oral Every 12 hours 05/24/22 1844     05/24/22 1845  cefTRIAXone (ROCEPHIN) 1 g in sodium chloride 0.9 % 100 mL IVPB        1 g 200 mL/hr over 30 Minutes Intravenous Every 12 hours 05/24/22 1841 05/27/22 1033       Skin assessment:      Nutritional status:  Body mass index is 16.04 kg/m.          Objective: Vitals:   05/27/22 0331 05/27/22 0833  BP: (!) 134/54 (!) 147/51  Pulse: 82 82  Resp: (!) 25 (!) 22  Temp:  98.3 F (36.8 C) 97.8 F (36.6 C)  SpO2: 97% 99%    Intake/Output Summary (Last 24 hours) at 05/27/2022 1404 Last data filed at 05/26/2022 2200 Gross per 24 hour  Intake 120 ml  Output --  Net 120 ml    Filed Weights   05/25/22 0141 05/27/22 0331  Weight: 42.6 kg 42.4 kg   Weight change:  Body mass index is 16.04 kg/m.   Physical Exam: General exam: Pleasant, cachectic elderly Caucasian female. Skin: No rashes, lesions or ulcers. HEENT: Atraumatic, normocephalic, no obvious bleeding Lungs: Diminished air entry in both bases.  No crackles or wheezing CVS: Regular rate and rhythm, no murmur GI/Abd soft, nontender, nondistended, bowel sound present CNS: Alert, awake, hard of hearing.  Slightly more somnolent today Psychiatry: Mood appropriate Extremities: No pedal edema no calf tenderness Data Review: I have personally reviewed the laboratory data and studies available.  F/u  labs ordered [employer] (From admission, onward)    None       Total time spent in review of labs and imaging, patient evaluation, formulation of plan, documentation and communication with family: 45 minutes  Signed, Lorin Glass, MD [facility] 05/27/2022

## 2022-05-27 NOTE — Plan of Care (Signed)

## 2022-05-28 ENCOUNTER — Other Ambulatory Visit (HOSPITAL_COMMUNITY): Payer: Self-pay

## 2022-05-28 DIAGNOSIS — R410 Disorientation, unspecified: Secondary | ICD-10-CM | POA: Diagnosis not present

## 2022-05-28 MED ORDER — MONTELUKAST SODIUM 10 MG PO TABS
10.0000 mg | ORAL_TABLET | Freq: Every day | ORAL | 0 refills | Status: DC
Start: 2022-05-28 — End: 2022-06-08
  Filled 2022-05-28: qty 30, 30d supply, fill #0

## 2022-05-28 MED ORDER — BUDESONIDE 0.25 MG/2ML IN SUSP
RESPIRATORY_TRACT | 11 refills | Status: DC
Start: 2022-05-28 — End: 2022-06-08
  Filled 2022-05-28: qty 60, 30d supply, fill #0

## 2022-05-28 MED ORDER — ARFORMOTEROL TARTRATE 15 MCG/2ML IN NEBU
INHALATION_SOLUTION | RESPIRATORY_TRACT | 11 refills | Status: DC
Start: 2022-05-28 — End: 2022-06-08
  Filled 2022-05-28: qty 60, 30d supply, fill #0

## 2022-05-28 MED ORDER — ALBUTEROL SULFATE HFA 108 (90 BASE) MCG/ACT IN AERS
2.0000 | INHALATION_SPRAY | Freq: Four times a day (QID) | RESPIRATORY_TRACT | 3 refills | Status: DC | PRN
Start: 2022-05-28 — End: 2022-06-08
  Filled 2022-05-28: qty 18, 25d supply, fill #0

## 2022-05-28 MED ORDER — SIMVASTATIN 20 MG PO TABS
20.0000 mg | ORAL_TABLET | Freq: Every evening | ORAL | 2 refills | Status: DC
Start: 2022-05-28 — End: 2022-06-08
  Filled 2022-05-28: qty 30, 30d supply, fill #0

## 2022-05-28 MED ORDER — QUETIAPINE FUMARATE 25 MG PO TABS
25.0000 mg | ORAL_TABLET | Freq: Every day | ORAL | 0 refills | Status: AC
  Filled 2022-05-28: qty 5, 5d supply, fill #0

## 2022-05-28 MED ORDER — SPIRIVA RESPIMAT 2.5 MCG/ACT IN AERS
2.0000 | INHALATION_SPRAY | Freq: Every day | RESPIRATORY_TRACT | 11 refills | Status: DC
  Filled 2022-05-28: qty 4, 30d supply, fill #0

## 2022-05-28 NOTE — Discharge Summary (Signed)
Physician Discharge Summary  Cathy Hensley:[id number] DOB: 12-Feb-1940 DOA: 05/24/2022  PCP: Deeann Saint, MD  Admit date: 05/24/2022 Discharge date: 05/28/2022  Admitted From: Home Discharge disposition: Home with home health PT  Brief narrative: Cathy Hensley is a 82 y.o. female with PMH significant for alpha-1 antitrypsin deficiency, past history of smoking, severe COPD, chronic hypercarbic respiratory failure on 2.5 to 4 L of oxygen at home, not on positive pressure ventilation, follows up with pulmonology [name] as an outpatient. Last seen by [name] on 4/29.  No changes made at the time. Also with history of chronic short-term memory loss, hypertension, hyperlipidemia Brought to the ED by family today for 2 to 3 days of disorientation, waxing and waning confusion, incomprehensible speech at times.  Also endorsed dysuria, but no fever.  In the ED, patient was afebrile, heart rate in 80s, blood pressure in 140s, breathing on room air Labs showed a venous blood gas with pH of 7.44, pCO2 80, bicarb 55, troponin mildly elevated 27 Urinalysis with hazy yellow urine with small hemoglobin, negative leukocytes, negative nitrite, rare bacteria Chest x-ray showed hyperinflation and bronchitic changes, no pulm edema CT head did not show any intracranial abnormality, showed moderate chronic small vessel disease. Patient received DuoNebs in the ED Kept in observation to [facility]  Subjective: Patient was seen and examined this morning.  Propped up in bed.  Not in distress.  On supplemental oxygen.  Husband and 2 daughters at bedside. Overall physical and mental status improving.  Assessment/Plan: Acute metabolic encephalopathy Chronic short memory loss Brought in for disorientation, waxing and waning confusion, incomprehensible speech in the setting of chronic hypercapnia.  Also mentioned dysuria raising possibility of UTI CT head unremarkable. In the next few days since admission,  patient's overall physical and mental gradually improved. She still has episodes of confusion but no restlessness or agitation. To help minimize sundowning and nocturnal behavioral issues, I have offered low-dose Seroquel 25 mg nightly to the family.  Family wants to try it if needed.  I wrote a limited supply prescription.  UTI On admission, patient complained of dysuria for 1 week. Urinalysis unremarkable. Present to be treated with IV Rocephin.  No need to continue postdischarge.  Severe COPD Chronic hypoxic hypercapnic respiratory failure No fever.  Complains of cough with thick sputum.  No infiltrates on chest x-ray PTA on Pulmicort twice daily, Spiriva daily, Brovana twice daily, Singulair daily albuterol as needed and Mucinex DM twice daily. 4/29, seen by pulmonologist.  No changes in breathing medicine at the time. Currently continued on bronchodilators as before. Chronically on 2.5 to 4 L oxygen. Not on positive pressure ventilation.  Given her severe hypercapnia, she was tried on nocturnal BiPAP.  However, patient did not tolerate.   Completed course of empiric antibiotics  Elevated troponin Mildly elevated troponin likely because of difficulty breathing.  HTN Bilateral lower extremity edema Patient was previously on amlodipine 5 mg daily.  Given the concern of bilateral pedal edema, amlodipine was stopped recently.  I would avoid diuresis given poor oral intake already.  TED hose were applied.  Swelling gradually improving.  HLD Continue aspirin 81 mg daily and simvastatin  Impaired mobility PT eval ordered.  Home with PT ordered. Ordered for Hospital bed, Wheelchair (measurements PT), Wheelchair cushion (measurements PT), BSC/3in1   Goals of care   Code Status: DNR.  Discussed with husband and daughter at bedside.  Daughter states that in the past, her pulmonologist [name] had  also opened up a discussion about hospice if her condition worsens.  Palliative care  consultation was obtained.  Family wanted to continue with PT post discharge.  They have been given resources to contact hospice if patient deteriorates at home.  Wounds:  - Pressure Injury 05/26/22 Sacrum Stage 1 -  Intact skin with non-blanchable redness of a localized area usually over a bony prominence. (Active)  Date First Assessed/Time First Assessed: 05/26/22 1900   Location: Sacrum  Staging: Stage 1 -  Intact skin with non-blanchable redness of a localized area usually over a bony prominence.  Present on Admission: Yes    Assessments 05/27/2022  8:00 PM 05/28/2022  7:30 AM  Dressing Type Foam - Lift dressing to assess site every shift Foam - Lift dressing to assess site every shift  Dressing Clean, Dry, Intact Clean, Dry, Intact  Site / Wound Assessment Pink;Red Pink;Red  Peri-wound Assessment Pink;Erythema (non-blanchable) --  Drainage Amount None None  Treatment Off loading Off loading     No associated orders.    Discharge Exam:   Vitals:   05/28/22 0352 05/28/22 0400 05/28/22 0600 05/28/22 0858  BP: (!) 150/59     Pulse: 82 84 83 89  Resp: (!) 22 20 20  (!) 22  Temp: 98.3 F (36.8 C)     TempSrc: Oral     SpO2: 100% 100% 97% 100%  Weight:      Height:        Body mass index is 16.04 kg/m.   General exam: Pleasant, cachectic elderly Caucasian female. Skin: No rashes, lesions or ulcers. HEENT: Atraumatic, normocephalic, no obvious bleeding Lungs: Diminished air entry in both bases.  No crackles or wheezing CVS: Regular rate and rhythm, no murmur GI/Abd soft, nontender, nondistended, bowel sound present CNS: Alert, awake, hard of hearing.  More awake gradually Psychiatry: Mood appropriate Extremities: No pedal edema no calf tenderness  Follow ups:    Follow-up Information     [facility] Follow up.   Why: Hospital Bed to be delivered to the home. Rolling walker, Bedside commode and Wheelchair to be delivered to the room. Contact information: Located in:  [employer] Address: [address], [place], [place] [address] Phone: [phone]        [facility] Follow up.   Why: Registered Nurse, Physical Therapy, and Occupational Therapy-office to call with visit times. Please call Liaison [name] with any questions at [phone] Contact information: [address] [place] [address] [phone]         Deeann Saint, MD Follow up.   Specialty: Family Medicine Contact information: [address] [place] [address] [phone]                 Discharge Instructions:   Discharge Instructions     Call MD for:  difficulty breathing, headache or visual disturbances   Complete by: As directed    Call MD for:  extreme fatigue   Complete by: As directed    Call MD for:  hives   Complete by: As directed    Call MD for:  persistant dizziness or light-headedness   Complete by: As directed    Call MD for:  persistant nausea and vomiting   Complete by: As directed    Call MD for:  severe uncontrolled pain   Complete by: As directed    Call MD for:  temperature >100.4   Complete by: As directed    Diet general  Complete by: As directed    Discharge instructions   Complete by: As directed    General discharge instructions: Follow with Primary MD Deeann Saint, MD in 7 days  Please request your PCP  to go over your hospital tests, procedures, radiology results at the follow up. Please get your medicines reviewed and adjusted.  Your PCP may decide to repeat certain labs or tests as needed. Do not drive, operate heavy machinery, perform activities at heights, swimming or participation in water activities or provide baby sitting services if your were admitted for syncope or siezures until you have seen by Primary MD or a Neurologist and advised to do so again. [place] Controlled Substance Reporting System database was reviewed. Do not drive, operate heavy machinery,  perform activities at heights, swim, participate in water activities or provide baby-sitting services while on medications for pain, sleep and mood until your outpatient physician has reevaluated you and advised to do so again.  You are strongly recommended to comply with the dose, frequency and duration of prescribed medications. Activity: As tolerated with Full fall precautions use walker/cane & assistance as needed Avoid using any recreational substances like cigarette, tobacco, alcohol, or non-prescribed drug. If you experience worsening of your admission symptoms, develop shortness of breath, life threatening emergency, suicidal or homicidal thoughts you must seek medical attention immediately by calling 911 or calling your MD immediately  if symptoms less severe. You must read complete instructions/literature along with all the possible adverse reactions/side effects for all the medicines you take and that have been prescribed to you. Take any new medicine only after you have completely understood and accepted all the possible adverse reactions/side effects.  Wear Seat belts while driving. You were cared for by a hospitalist during your hospital stay. If you have any questions about your discharge medications or the care you received while you were in the hospital after you are discharged, you can call the unit and ask to speak with the hospitalist or the covering physician. Once you are discharged, your primary care physician will handle any further medical issues. Please note that NO REFILLS for any discharge medications will be authorized once you are discharged, as it is imperative that you return to your primary care physician (or establish a relationship with a primary care physician if you do not have one).   Discharge wound care:   Complete by: As directed    Increase activity slowly   Complete by: As directed        Discharge Medications:   Allergies as of 05/28/2022   No Known  Allergies      Medication List     STOP taking these medications    amLODipine 5 MG tablet Commonly known as: NORVASC   Blood Pressure Kit   OXYGEN       TAKE these medications    acetaminophen 500 MG tablet Commonly known as: TYLENOL Take 500 mg by mouth every 6 (six) hours as needed.   albuterol 108 (90 Base) MCG/ACT inhaler Commonly known as: VENTOLIN HFA INHALE 2 PUFFS INTO THE LUNGS EVERY 6 HOURS AS NEEDED FOR WHEEZING OR SHORTNESS OF BREATH   arformoterol 15 MCG/2ML Nebu Commonly known as: Brovana USE 1 VIAL  IN  NEBULIZER TWICE  DAILY - morning and evening What changed:  how much to take how to take this when to take this additional instructions   aspirin 81 MG chewable tablet Chew 1 tablet (81 mg total) by mouth daily.  budesonide 0.25 MG/2ML nebulizer solution Commonly known as: PULMICORT Use 1 vial in nebulizer twice daily.  Once mouth after treatment. What changed:  how much to take how to take this when to take this additional instructions   CENTRUM SILVER PO Take 1 tablet by mouth daily.   dextromethorphan-guaiFENesin 30-600 MG 12hr tablet Commonly known as: MUCINEX DM Take 1 tablet by mouth 2 (two) times daily.   Flutter Devi 1 Device by Does not apply route as needed.   montelukast 10 MG tablet Commonly known as: SINGULAIR TAKE 1 TABLET(10 MG) BY MOUTH AT BEDTIME What changed: See the new instructions.   QUEtiapine 25 MG tablet Commonly known as: SEROquel Take 1 tablet (25 mg total) by mouth at bedtime for 5 days.   simvastatin 20 MG tablet Commonly known as: ZOCOR TAKE 1 TABLET(20 MG) BY MOUTH AT BEDTIME What changed: See the new instructions.   Spiriva Respimat 2.5 MCG/ACT Aers Generic drug: Tiotropium Bromide Monohydrate INHALE 2 PUFFS INTO THE LUNGS DAILY               Durable Medical Equipment  (From admission, onward)           Start     Ordered   05/27/22 0816  For home use only DME oxygen  Once        Question Answer Comment  Length of Need Lifetime   Mode or (Route) Nasal cannula   Liters per Minute 6   Frequency Continuous (stationary and portable oxygen unit needed)   Oxygen conserving device Yes   Oxygen delivery system Gas      05/27/22 0815   05/26/22 1447  For home use only DME Hospital bed  Once       Comments: Therapeutic mattress  Question Answer Comment  Length of Need Lifetime   The above medical condition requires: Patient requires the ability to reposition frequently   Bed type Semi-electric   Support Surface: Gel Overlay      05/26/22 1447   05/26/22 1356  For home use only DME standard manual wheelchair with seat cushion  Once       Comments: Patient suffers from advanced COPD which impairs their ability to perform daily activities like bathing, dressing, feeding, grooming, and toileting in the home.  A cane, crutch, or walker will not resolve issue with performing activities of daily living. A wheelchair will allow patient to safely perform daily activities. Patient can safely propel the wheelchair in the home or has a caregiver who can provide assistance. Length of need Lifetime. Accessories: elevating leg rests (ELRs), wheel locks, extensions and anti-tippers.   05/26/22 1355   05/26/22 1355  For home use only DME wheelchair cushion (seat and back)  Once        05/26/22 1354   05/26/22 1355  For home use only DME 3 n 1  Once        05/26/22 1355              Discharge Care Instructions  (From admission, onward)           Start     Ordered   05/28/22 0000  Discharge wound care:        05/28/22 1312             The results of significant diagnostics from this hospitalization (including imaging, microbiology, ancillary and laboratory) are listed below for reference.    Procedures and Diagnostic Studies:   CT Head Wo Contrast  Result Date: 05/24/2022 CLINICAL DATA:  Mental status change, unknown cause. EXAM: CT HEAD WITHOUT CONTRAST  TECHNIQUE: Contiguous axial images were obtained from the base of the skull through the vertex without intravenous contrast. RADIATION DOSE REDUCTION: This exam was performed according to the departmental dose-optimization program which includes automated exposure control, adjustment of the mA and/or kV according to patient size and/or use of iterative reconstruction technique. COMPARISON:  Head CT 10/02/2003. FINDINGS: Brain: No acute hemorrhage. Moderate chronic small-vessel disease. Unchanged dystrophic appearing calcification in the medial aspect of the left occipital lobe. No hydrocephalus or extra-axial collection. No mass effect or midline shift. Vascular: No hyperdense vessel or unexpected calcification. Skull: No calvarial fracture or suspicious bone lesion. Skull base is unremarkable. Sinuses/Orbits: Unremarkable. Other: None. IMPRESSION: 1. No acute intracranial abnormality. 2. Moderate chronic small-vessel disease. Electronically Signed   By: Orvan Falconer M.D.   On: 05/24/2022 14:35   DG Chest 2 View  Result Date: 05/24/2022 CLINICAL DATA:  Shortness of breath. EXAM: CHEST - 2 VIEW COMPARISON:  05/11/2021 FINDINGS: Lungs are hyperinflated. There is perihilar peribronchial thickening. There are no focal consolidations or pleural effusions. Heart is enlarged. No pulmonary edema. IMPRESSION: 1. Hyperinflation and bronchitic changes. 2. Cardiomegaly. No pulmonary edema. Electronically Signed   By: Norva Pavlov M.D.   On: 05/24/2022 13:28     Labs:   Basic Metabolic Panel: Recent Labs  Lab 05/24/22 1256 05/24/22 1503 05/25/22 0208 05/26/22 0136  NA 147* 141 143 143  K 4.3 4.4 4.0 3.9  CL 90*  --  87* 88*  CO2 >45*  --  >45* >45*  GLUCOSE 82  --  101* 75  BUN 18  --  16 11  CREATININE 0.51  --  0.53 0.37*  CALCIUM 9.5  --  9.0 8.6*   GFR Estimated Creatinine Clearance: 36.9 mL/min (A) (by C-G formula based on SCr of 0.37 mg/dL (L)). Liver Function Tests: Recent Labs  Lab  05/24/22 1256  AST 21  ALT 18  ALKPHOS 45  BILITOT 0.6  PROT 5.8*  ALBUMIN 3.5   No results for input(s): "LIPASE", "AMYLASE" in the last 168 hours. No results for input(s): "AMMONIA" in the last 168 hours. Coagulation profile No results for input(s): "INR", "PROTIME" in the last 168 hours.  CBC: Recent Labs  Lab 05/24/22 1256 05/24/22 1503 05/25/22 0208 05/26/22 0136  WBC 4.4  --  4.4 3.0*  NEUTROABS  --   --   --  1.9  HGB 9.7* 10.5* 8.7* 9.0*  HCT 33.2* 31.0* 28.9* 29.5*  MCV 108.5*  --  104.3* 105.0*  PLT 120*  --  116* 103*   Cardiac Enzymes: No results for input(s): "CKTOTAL", "CKMB", "CKMBINDEX", "TROPONINI" in the last 168 hours. BNP: Invalid input(s): "POCBNP" CBG: Recent Labs  Lab 05/24/22 1453 05/24/22 1546  GLUCAP 62* 99   D-Dimer No results for input(s): "DDIMER" in the last 72 hours. Hgb A1c No results for input(s): "HGBA1C" in the last 72 hours. Lipid Profile No results for input(s): "CHOL", "HDL", "LDLCALC", "TRIG", "CHOLHDL", "LDLDIRECT" in the last 72 hours. Thyroid function studies No results for input(s): "TSH", "T4TOTAL", "T3FREE", "THYROIDAB" in the last 72 hours.  Invalid input(s): "FREET3" Anemia work up No results for input(s): "VITAMINB12", "FOLATE", "FERRITIN", "TIBC", "IRON", "RETICCTPCT" in the last 72 hours. Microbiology No results found for this or any previous visit (from the past 240 hour(s)).  Time coordinating discharge: 45 minutes  Signed: Sharonlee Nine  [facility] 05/28/2022, 1:13 PM

## 2022-05-28 NOTE — Progress Notes (Signed)
   Patient Saturations on Room Air at Rest = 79%  Patient Saturations on 3 Liters of oxygen while Sitting = 92%

## 2022-05-28 NOTE — Care Management Important Message (Signed)
Important Message  Patient Details  Name: LANDI NEUVILLE MRN: [id number] Date of Birth: 1940/06/17   Medicare Important Message Given:  Yes     Sherilyn Banker 05/28/2022, 3:25 PM

## 2022-05-28 NOTE — TOC Transition Note (Addendum)
Transition of Care [facility]) - CM/SW Discharge Note   Patient Details  Name: Cathy Hensley MRN: [id number] Date of Birth: 17-Jul-1940  [facility] CM/SW Contact:  Gala Lewandowsky, RN Phone Number: 05/28/2022, 11:25 AM   Clinical Narrative: Per family, the plan will be for transition home today. Hospital bed is being delivered today by 5:00 pm. Other equipment has been delivered to the room. Staff RN to check the liter flow for oxygen needs. [facility] will be providing 24 hours supervision starting today at 3:00 pm. No further needs identified.     1331 05-28-22 [facility] is aware of the increase in liter flow. Family to call [facility] once they arrive home for the concentrator to be delivered. [facility] will reach out to the family regarding palliative services.  Final next level of care: Home w Home Health Services Barriers to Discharge: No Barriers Identified  Patient Goals and CMS Choice CMS Medicare.gov Compare Post Acute Care list provided to:: Patient Choice offered to / list presented to : Adult Children  Discharge Plan and Services Additional resources added to the After Visit Summary for     Discharge Planning Services: CM Consult Post Acute Care Choice: Home Health, Durable Medical Equipment          DME Arranged: Hospital bed, High strength lightweight manual wheelchair with seat cushion, Walker rolling, Bedside commode DME Agency: [employer] Date DME Agency Contacted: 05/26/22 Time DME Agency Contacted: 1500 Representative spoke with at DME Agency: Vaughan Basta HH Arranged: RN, Disease Management, PT, OT HH Agency: [facility] Date [facility] Agency Contacted: 05/26/22 Time HH Agency Contacted: 1530 Representative spoke with at [facility] Ms Agency: [name]  Social Determinants of Health (SDOH) Interventions SDOH Screenings   Food Insecurity: No Food Insecurity (05/24/2022)  Housing: Patient Declined (05/24/2022)  Transportation  Needs: No Transportation Needs (05/24/2022)  Utilities: Not At Risk (05/24/2022)  Alcohol Screen: Low Risk  (01/28/2022)  Depression (PHQ2-9): High Risk (03/25/2022)  Financial Resource Strain: Low Risk  (01/28/2022)  Physical Activity: Inactive (01/28/2022)  Social Connections: Moderately Isolated (01/28/2022)  Stress: No Stress Concern Present (01/28/2022)  Tobacco Use: Medium Risk (05/24/2022)   Readmission Risk Interventions     No data to display

## 2022-06-01 ENCOUNTER — Telehealth: Payer: Self-pay

## 2022-06-01 NOTE — Transitions of Care (Post Inpatient/ED Visit) (Signed)
06/01/2022  Name: Cathy Hensley MRN: [id number] DOB: 12-Jan-1940  Today's TOC FU Call Status: Today's TOC FU Call Status:: Successful TOC FU Call Competed TOC FU Call Complete Date: 06/01/22  Transition Care Management Follow-up Telephone Call Date of Discharge: 05/28/22 Discharge Facility: [facility]) Type of Discharge: Inpatient Admission Primary Inpatient Discharge Diagnosis:: "AMS" How have you been since you were released from the hospital?: Better (Daughter states pt returned back to her home on yest-has 24hr cargivers. She is checking on pt several times per day.) Any questions or concerns?: No  Items Reviewed: Did you receive and understand the discharge instructions provided?: Yes Medications obtained,verified, and reconciled?: No Medications Not Reviewed Reasons:: Other: (partial med review as daughter not with meds at present-she reports she has filled med planner for pt-she has also instructed caregivers on how to adminster resp inhalers and neb txs) Any new allergies since your discharge?: No Dietary orders reviewed?: Yes Type of Diet Ordered:: low salt/heart healthy Do you have support at home?: Yes People in Home: spouse Name of Support/Comfort Primary Source: 24hr caregivers provided by [employer]  Medications Reviewed Today: Medications Reviewed Today     Reviewed by Charlyn Minerva, RN (Registered Nurse) on 06/01/22 at 1032  Med List Status: <None>   Medication Order Taking? Sig Documenting Provider Last Dose Status Informant  acetaminophen (TYLENOL) 500 MG tablet [id number] No Take 500 mg by mouth every 6 (six) hours as needed. [provider] Unknown Active Pharmacy Records, Self, Family Member  albuterol (VENTOLIN HFA) 108 (90 Base) MCG/ACT inhaler [id number]  Inhale 2 puffs into the lungs every 6 (six) hours as needed for wheezing or shortness of breath. Lorin Glass, MD  Active   arformoterol (BROVANA) 15 MCG/2ML NEBU [id number]   USE 1 VIAL  IN  NEBULIZER TWICE  DAILY - morning and evening Dahal, Melina Schools, MD  Active   aspirin 81 MG chewable tablet [id number] No Chew 1 tablet (81 mg total) by mouth daily. Lonia Blood, MD 05/24/2022 Active Self, Family Member, Pharmacy Records  budesonide (PULMICORT) 0.25 MG/2ML nebulizer solution [id number]  Use 1 vial in nebulizer twice daily.  Once mouth after treatment. Lorin Glass, MD  Active   dextromethorphan-guaiFENesin [facility] DM) 30-600 MG 12hr tablet [id number] No Take 1 tablet by mouth 2 (two) times daily. [provider] Unknown Active Self, Family Member, Pharmacy Records  montelukast (SINGULAIR) 10 MG tablet [id number]  Take 1 tablet (10 mg total) by mouth at bedtime. Lorin Glass, MD  Active   Multiple Vitamins-Minerals (CENTRUM SILVER PO) [id number] No Take 1 tablet by mouth daily. [provider] 05/24/2022 Active Self, Family Member, Pharmacy Records  QUEtiapine (SEROQUEL) 25 MG tablet [id number]  Take 1 tablet (25 mg total) by mouth at bedtime for 5 days. Lorin Glass, MD  Active   Respiratory Therapy Supplies (FLUTTER) DEVI [id number] No 1 Device by Does not apply route as needed. Nyoka Cowden, MD Taking Active Self, Family Member, Pharmacy Records  simvastatin (ZOCOR) 20 MG tablet [id number]  Take 1 tablet (20 mg total) by mouth at bedtime. Lorin Glass, MD  Active   Tiotropium Bromide Monohydrate (SPIRIVA RESPIMAT) 2.5 MCG/ACT AERS [id number]  Inhale 2 puffs into the lungs daily. Lorin Glass, MD  Active   Med List Note Carola Rhine, [place] 09/19/19 1250): Rx fax number is [phone]            Home Care and Equipment/Supplies: Were Home Health Services Ordered?: Yes Name of Home Health Agency::  [facility] Has Agency set up a time to come to your home?: No (Daughter states she has already contacted inpt CM(Brenda) who set up services to advise that no one has contacted her-CM was following up with agency and will f/u with daughter on  update) Any new equipment or medical supplies ordered?: Yes Name of Medical supply agency?: [facility] bed,walker, wheelchair,[facility] Were you able to get the equipment/medical supplies?: Yes Do you have any questions related to the use of the equipment/supplies?: No  Functional Questionnaire: Do you need assistance with bathing/showering or dressing?: Yes Do you need assistance with meal preparation?: Yes Do you need assistance with eating?: No Do you have difficulty maintaining continence: Yes Do you need assistance with getting out of bed/getting out of a chair/moving?: Yes Do you have difficulty managing or taking your medications?: Yes (dtr fills med planner weekly)  Follow up appointments reviewed: PCP Follow-up appointment confirmed?: No (Dtr preferred to wait until she ses how pt prgreses & until [facility] visits dates/times made to make an appt as it is diffiuclt to get pt out of the home-encouraged dtr to consider virtual visit-she will think about it and call office to make f/u appt) MD Provider Line Number:[phone] Given: No Specialist Hospital Follow-up appointment confirmed?: NA Do you need transportation to your follow-up appointment?: No Do you understand care options if your condition(s) worsen?: Yes-patient verbalized understanding  SDOH Interventions Today    Flowsheet Row Most Recent Value  SDOH Interventions   Food Insecurity Interventions Intervention Not Indicated  Transportation Interventions Intervention Not Indicated      TOC Interventions Today    Flowsheet Row Most Recent Value  TOC Interventions   TOC Interventions Discussed/Reviewed TOC Interventions Discussed      Interventions Today    Flowsheet Row Most Recent Value  General Interventions   General Interventions Discussed/Reviewed General Interventions Discussed, Doctor Visits  Doctor Visits Discussed/Reviewed Doctor Visits Discussed, PCP  PCP/Specialist Visits Compliance with follow-up visit   Education Interventions   Education Provided Provided Education  Provided Verbal Education On Nutrition, When to see the doctor, Medication, Other  Nutrition Interventions   Nutrition Discussed/Reviewed Nutrition Discussed, Adding fruits and vegetables, Decreasing salt  Pharmacy Interventions   Pharmacy Dicussed/Reviewed Pharmacy Topics Discussed, Medications and their functions  Safety Interventions   Safety Discussed/Reviewed Safety Discussed, Fall Risk, Home Safety  Home Safety Assistive Devices  Advanced Directive Interventions   Advanced Directives Discussed/Reviewed Advanced Care Planning  [Daughter states that they will see how pt progresses/does the first couple of weeks returning home-and if no improvement/progress they will probably chose hospice-pt has already been referred to pallaitive care services]       Antionette Fairy, RN,BSN,CCM [facility] Care Management Care Management Community Coordinator Direct Phone: [phone] Toll Free: [phone] Fax: [phone]

## 2022-06-02 ENCOUNTER — Other Ambulatory Visit: Payer: Self-pay

## 2022-06-03 ENCOUNTER — Telehealth: Payer: Self-pay

## 2022-06-03 ENCOUNTER — Telehealth: Payer: Self-pay | Admitting: Family Medicine

## 2022-06-03 DIAGNOSIS — E785 Hyperlipidemia, unspecified: Secondary | ICD-10-CM | POA: Diagnosis not present

## 2022-06-03 DIAGNOSIS — G9341 Metabolic encephalopathy: Secondary | ICD-10-CM | POA: Diagnosis not present

## 2022-06-03 DIAGNOSIS — R413 Other amnesia: Secondary | ICD-10-CM | POA: Diagnosis not present

## 2022-06-03 DIAGNOSIS — I10 Essential (primary) hypertension: Secondary | ICD-10-CM | POA: Diagnosis not present

## 2022-06-03 DIAGNOSIS — Z7951 Long term (current) use of inhaled steroids: Secondary | ICD-10-CM | POA: Diagnosis not present

## 2022-06-03 DIAGNOSIS — R911 Solitary pulmonary nodule: Secondary | ICD-10-CM | POA: Diagnosis not present

## 2022-06-03 DIAGNOSIS — J9612 Chronic respiratory failure with hypercapnia: Secondary | ICD-10-CM | POA: Diagnosis not present

## 2022-06-03 DIAGNOSIS — J449 Chronic obstructive pulmonary disease, unspecified: Secondary | ICD-10-CM | POA: Diagnosis not present

## 2022-06-03 DIAGNOSIS — Z7982 Long term (current) use of aspirin: Secondary | ICD-10-CM | POA: Diagnosis not present

## 2022-06-03 DIAGNOSIS — I700 Atherosclerosis of aorta: Secondary | ICD-10-CM | POA: Diagnosis not present

## 2022-06-03 DIAGNOSIS — N390 Urinary tract infection, site not specified: Secondary | ICD-10-CM | POA: Diagnosis not present

## 2022-06-03 DIAGNOSIS — E43 Unspecified severe protein-calorie malnutrition: Secondary | ICD-10-CM | POA: Diagnosis not present

## 2022-06-03 DIAGNOSIS — J9611 Chronic respiratory failure with hypoxia: Secondary | ICD-10-CM | POA: Diagnosis not present

## 2022-06-03 DIAGNOSIS — L89151 Pressure ulcer of sacral region, stage 1: Secondary | ICD-10-CM | POA: Diagnosis not present

## 2022-06-03 NOTE — Telephone Encounter (Signed)
(  4:59 am) PC SW returned call to patient's daughter-Cathy Hensley. SW provided education to her regarding the palliative care program, which she is endorsed for her mother. She also confirmed support of upcoming visit scheduled.  No other concerns were noted.

## 2022-06-03 NOTE — Telephone Encounter (Addendum)
Corliss Marcus, PT with [facility] [phone] *Ok to leave a detailed message on this line  - PT Verbal Order -   Frequency:   2w4 1w4

## 2022-06-04 ENCOUNTER — Encounter (HOSPITAL_COMMUNITY): Payer: Self-pay

## 2022-06-04 ENCOUNTER — Emergency Department (HOSPITAL_COMMUNITY): Payer: Medicare Other

## 2022-06-04 ENCOUNTER — Emergency Department (HOSPITAL_COMMUNITY)
Admission: EM | Admit: 2022-06-04 | Discharge: 2022-06-04 | Disposition: A | Discharge status: Home / Self Care | Payer: Medicare Other | Attending: Emergency Medicine | Admitting: Emergency Medicine

## 2022-06-04 ENCOUNTER — Other Ambulatory Visit: Payer: Self-pay

## 2022-06-04 DIAGNOSIS — J9602 Acute respiratory failure with hypercapnia: Secondary | ICD-10-CM | POA: Diagnosis not present

## 2022-06-04 DIAGNOSIS — Z7982 Long term (current) use of aspirin: Secondary | ICD-10-CM | POA: Diagnosis not present

## 2022-06-04 DIAGNOSIS — I10 Essential (primary) hypertension: Secondary | ICD-10-CM | POA: Diagnosis not present

## 2022-06-04 DIAGNOSIS — R0902 Hypoxemia: Secondary | ICD-10-CM | POA: Diagnosis not present

## 2022-06-04 DIAGNOSIS — Z7189 Other specified counseling: Secondary | ICD-10-CM

## 2022-06-04 DIAGNOSIS — J9811 Atelectasis: Secondary | ICD-10-CM | POA: Diagnosis not present

## 2022-06-04 DIAGNOSIS — Z7951 Long term (current) use of inhaled steroids: Secondary | ICD-10-CM | POA: Diagnosis not present

## 2022-06-04 DIAGNOSIS — J9601 Acute respiratory failure with hypoxia: Secondary | ICD-10-CM | POA: Insufficient documentation

## 2022-06-04 DIAGNOSIS — Z7401 Bed confinement status: Secondary | ICD-10-CM | POA: Diagnosis not present

## 2022-06-04 DIAGNOSIS — F17200 Nicotine dependence, unspecified, uncomplicated: Secondary | ICD-10-CM | POA: Insufficient documentation

## 2022-06-04 DIAGNOSIS — R0602 Shortness of breath: Secondary | ICD-10-CM | POA: Diagnosis not present

## 2022-06-04 DIAGNOSIS — J449 Chronic obstructive pulmonary disease, unspecified: Secondary | ICD-10-CM | POA: Insufficient documentation

## 2022-06-04 DIAGNOSIS — R6889 Other general symptoms and signs: Secondary | ICD-10-CM | POA: Diagnosis not present

## 2022-06-04 DIAGNOSIS — G9340 Encephalopathy, unspecified: Secondary | ICD-10-CM | POA: Insufficient documentation

## 2022-06-04 DIAGNOSIS — J90 Pleural effusion, not elsewhere classified: Secondary | ICD-10-CM | POA: Diagnosis not present

## 2022-06-04 DIAGNOSIS — Z743 Need for continuous supervision: Secondary | ICD-10-CM | POA: Diagnosis not present

## 2022-06-04 DIAGNOSIS — R0689 Other abnormalities of breathing: Secondary | ICD-10-CM | POA: Diagnosis not present

## 2022-06-04 LAB — CBC WITH DIFFERENTIAL/PLATELET
Abs Immature Granulocytes: 0.03 10*3/uL (ref 0.00–0.07)
Basophils Absolute: 0 10*3/uL (ref 0.0–0.1)
Basophils Relative: 0 %
Eosinophils Absolute: 0 10*3/uL (ref 0.0–0.5)
Eosinophils Relative: 0 %
HCT: 33.2 % — ABNORMAL LOW (ref 36.0–46.0)
Hemoglobin: 9.7 g/dL — ABNORMAL LOW (ref 12.0–15.0)
Immature Granulocytes: 1 %
Lymphocytes Relative: 19 %
Lymphs Abs: 1 10*3/uL (ref 0.7–4.0)
MCH: 31.7 pg (ref 26.0–34.0)
MCHC: 29.2 g/dL — ABNORMAL LOW (ref 30.0–36.0)
MCV: 108.5 fL — ABNORMAL HIGH (ref 80.0–100.0)
Monocytes Absolute: 0.5 10*3/uL (ref 0.1–1.0)
Monocytes Relative: 9 %
Neutro Abs: 3.7 10*3/uL (ref 1.7–7.7)
Neutrophils Relative %: 71 %
Platelets: 179 10*3/uL (ref 150–400)
RBC: 3.06 MIL/uL — ABNORMAL LOW (ref 3.87–5.11)
RDW: 12.9 % (ref 11.5–15.5)
WBC: 5.2 10*3/uL (ref 4.0–10.5)
nRBC: 0 % (ref 0.0–0.2)

## 2022-06-04 LAB — I-STAT VENOUS BLOOD GAS, ED
Acid-Base Excess: 27 mmol/L — ABNORMAL HIGH (ref 0.0–2.0)
Bicarbonate: 56.3 mmol/L — ABNORMAL HIGH (ref 20.0–28.0)
Calcium, Ion: 1.11 mmol/L — ABNORMAL LOW (ref 1.15–1.40)
HCT: 30 % — ABNORMAL LOW (ref 36.0–46.0)
Hemoglobin: 10.2 g/dL — ABNORMAL LOW (ref 12.0–15.0)
O2 Saturation: 83 %
Potassium: 4.8 mmol/L (ref 3.5–5.1)
Sodium: 142 mmol/L (ref 135–145)
TCO2: >50 mmol/L — ABNORMAL HIGH (ref 22–32)
pCO2, Ven: 91.2 mmHg (ref 44–60)
pH, Ven: 7.398 (ref 7.25–7.43)
pO2, Ven: 52 mmHg — ABNORMAL HIGH (ref 32–45)

## 2022-06-04 LAB — BASIC METABOLIC PANEL
BUN: 9 mg/dL (ref 8–23)
CO2: >45 mmol/L — ABNORMAL HIGH (ref 22–32)
Calcium: 9.4 mg/dL (ref 8.9–10.3)
Chloride: 88 mmol/L — ABNORMAL LOW (ref 98–111)
Creatinine, Ser: 0.37 mg/dL — ABNORMAL LOW (ref 0.44–1.00)
GFR, Estimated: >60 mL/min (ref >60)
Glucose, Bld: 148 mg/dL — ABNORMAL HIGH (ref 70–99)
Potassium: 4.5 mmol/L (ref 3.5–5.1)
Sodium: 147 mmol/L — ABNORMAL HIGH (ref 135–145)

## 2022-06-04 LAB — BRAIN NATRIURETIC PEPTIDE: B Natriuretic Peptide: 115.9 pg/mL — ABNORMAL HIGH (ref 0.0–100.0)

## 2022-06-04 LAB — TROPONIN I (HIGH SENSITIVITY): Troponin I (High Sensitivity): 30 ng/L — ABNORMAL HIGH (ref <18)

## 2022-06-04 LAB — LACTIC ACID, PLASMA: Lactic Acid, Venous: 0.8 mmol/L (ref 0.5–1.9)

## 2022-06-04 MED ORDER — ONDANSETRON HCL 4 MG/2ML IJ SOLN: 4.0000 mg | Freq: Four times a day (QID) | INTRAMUSCULAR | Status: DC | PRN

## 2022-06-04 MED ORDER — NICARDIPINE HCL IN NACL 20-0.86 MG/200ML-% IV SOLN: 0.0000 mg/h | INTRAVENOUS | Status: DC

## 2022-06-04 MED ORDER — SODIUM CHLORIDE 0.9 % IV SOLN
1.0000 g | Freq: Once | INTRAVENOUS | Status: AC
  Administered 2022-06-04: 1 g via INTRAVENOUS
  Filled 2022-06-04: qty 10

## 2022-06-04 MED ORDER — MORPHINE SULFATE (PF) 2 MG/ML IV SOLN: 1.0000 mg | INTRAVENOUS | Status: DC | PRN

## 2022-06-04 MED ORDER — IPRATROPIUM-ALBUTEROL 0.5-2.5 (3) MG/3ML IN SOLN
3.0000 mL | RESPIRATORY_TRACT | Status: AC
  Administered 2022-06-04 (×3): 3 mL via RESPIRATORY_TRACT
  Filled 2022-06-04: qty 9

## 2022-06-04 MED ORDER — ACETAMINOPHEN 650 MG RE SUPP: 650.0000 mg | Freq: Four times a day (QID) | RECTAL | Status: DC | PRN

## 2022-06-04 MED ORDER — POLYVINYL ALCOHOL 1.4 % OP SOLN: 1.0000 [drp] | Freq: Four times a day (QID) | OPHTHALMIC | Status: DC | PRN

## 2022-06-04 MED ORDER — BIOTENE DRY MOUTH MT LIQD: 15.0000 mL | OROMUCOSAL | Status: DC | PRN

## 2022-06-04 MED ORDER — GLYCOPYRROLATE 0.2 MG/ML IJ SOLN: 0.4000 mg | INTRAMUSCULAR | Status: DC | PRN

## 2022-06-04 MED ORDER — MAGNESIUM SULFATE 2 GM/50ML IV SOLN
2.0000 g | Freq: Once | INTRAVENOUS | Status: AC
  Administered 2022-06-04: 2 g via INTRAVENOUS
  Filled 2022-06-04: qty 50

## 2022-06-04 MED ORDER — LORAZEPAM 2 MG/ML IJ SOLN: 1.0000 mg | INTRAMUSCULAR | Status: DC | PRN

## 2022-06-04 NOTE — ED Notes (Signed)
ED Provider at bedside.  Palliative care present at bedside.

## 2022-06-04 NOTE — Progress Notes (Signed)
   06/04/22 0824  BiPAP/CPAP/SIPAP  $ Non-Invasive Ventilator  Non-Invasive Vent Initial;Non-Invasive Vent Set Up  $ Face Mask Medium Yes  BiPAP/CPAP/SIPAP Pt Type Adult  BiPAP/CPAP/SIPAP SERVO (air)  Mask Type Full face mask  Mask Size Medium  Set Rate 10 breaths/min  Respiratory Rate 22 breaths/min  IPAP 18 cmH20  EPAP 8 cmH2O  FiO2 (%) 40 %  Flow Rate 0 lpm  Minute Ventilation 6  Leak 42  Peak Inspiratory Pressure (PIP) 18  Tidal Volume (Vt) 407  Auto Titrate No  Press High Alarm 25 cmH2O  Oxygen Percent 40 %  BiPAP/CPAP /SiPAP Vitals  Pulse Rate 88  Resp (!) 22  SpO2 100 %  Bilateral Breath Sounds Clear;Diminished  MEWS Score/Color  MEWS Score 1  MEWS Score Color Green

## 2022-06-04 NOTE — ED Notes (Signed)
Patient denies pain and is resting comfortably. Family present at bedside. Will continue to monitor.

## 2022-06-04 NOTE — Telephone Encounter (Signed)
ATC Vernona Rieger, left message on VM giving OK for PT per Dr Salomon Fick.

## 2022-06-04 NOTE — ED Notes (Signed)
Pt sating at 3 informed RN Katrina & RN Arline Asp

## 2022-06-04 NOTE — ED Notes (Signed)
Family updated as to patient's status.family present at bedside. Pt oxygen saturation noted 45% on 4L, respiratory aware, nonrebreather mask applied, pt's current oxygen saturation 100% via nonrebreather mask at this time, tolerating well.

## 2022-06-04 NOTE — ED Notes (Signed)
Family updated as to patient's status transport to [facility] via [place]. Pt resting comfortably at this time with no complaints.

## 2022-06-04 NOTE — ED Notes (Signed)
Patient denies pain and is resting comfortably. Warm blankets provided per family request.

## 2022-06-04 NOTE — Progress Notes (Signed)
Pt taken off BiPAP at this time. Pt kept trying to pull mask off and stating she wanted it off. MD will be made aware.

## 2022-06-04 NOTE — ED Notes (Signed)
Pt currently receiving 4L oxygen via nasal cannula, tolerating well, oxygen saturation 100% at this time. Mouth swabs provided to family per request.

## 2022-06-04 NOTE — Progress Notes (Signed)
[facility] ([facility]) Hospital Liaison Note   Referral received for patient/family interest in [facility]. Chart under review by [facility] physician.    Bed offered and accepted for transfer today. Unit RN please call report to [phone] prior to patient leaving the unit. Please send signed DNR and paperwork with patient.    Please leave all IV access and ports in place.    Please call with any questions or concerns. Thank you   Shanita Wicker, LCSW [facility] Hospital Liaison [phone] 

## 2022-06-04 NOTE — ED Provider Notes (Signed)
[place] EMERGENCY DEPARTMENT AT [facility] Provider Note   CSN: [id number] Arrival date & time: 06/04/22  0805     History  Chief Complaint  Patient presents with   Shortness of Breath    Cathy Hensley is a 82 y.o. female. With pmh alpha-1 antitrypsin deficiency, past history of smoking, severe COPD, chronic hypercarbic respiratory failure on 2.5 to 4 L of oxygen at home, recent admission to the hospital 05/24/2022 to 05/28/2022 for encephalopathy in the setting of chronic hypercapnia and UTI brought in by EMS from home for confusion and hypoxia noted to be satting 50s on home oxygen supplementation.  Given Solu-Medrol 125 mg and 5 of albuterol on route with EMS. Patient confused and encephalopathic on exam.   Patient to encephalopathic and in respiratory distress on arrival and unable to provide any history.  Started on BiPAP by ED RT.   Shortness of Breath      Home Medications Prior to Admission medications   Medication Sig Start Date End Date Taking? Authorizing Provider  acetaminophen (TYLENOL) 500 MG tablet Take 500 mg by mouth every 6 (six) hours as needed.    [provider]  albuterol (VENTOLIN HFA) 108 (90 Base) MCG/ACT inhaler Inhale 2 puffs into the lungs every 6 (six) hours as needed for wheezing or shortness of breath. 05/28/22   Dahal, Melina Schools, MD  arformoterol (BROVANA) 15 MCG/2ML NEBU USE 1 VIAL  IN  NEBULIZER TWICE  DAILY - morning and evening 05/28/22   Lorin Glass, MD  aspirin 81 MG chewable tablet Chew 1 tablet (81 mg total) by mouth daily. 09/07/14   Lonia Blood, MD  budesonide (PULMICORT) 0.25 MG/2ML nebulizer solution Use 1 vial in nebulizer twice daily.  Once mouth after treatment. 05/28/22   Lorin Glass, MD  dextromethorphan-guaiFENesin (MUCINEX DM) 30-600 MG 12hr tablet Take 1 tablet by mouth 2 (two) times daily.    [provider]  montelukast (SINGULAIR) 10 MG tablet Take 1 tablet (10 mg total) by mouth at bedtime.  05/28/22   Lorin Glass, MD  Multiple Vitamins-Minerals (CENTRUM SILVER PO) Take 1 tablet by mouth daily.    [provider]  QUEtiapine (SEROQUEL) 25 MG tablet Take 1 tablet (25 mg total) by mouth at bedtime for 5 days. 05/28/22 06/02/22  Lorin Glass, MD  Respiratory Therapy Supplies (FLUTTER) DEVI 1 Device by Does not apply route as needed. 12/15/16   Nyoka Cowden, MD  simvastatin (ZOCOR) 20 MG tablet Take 1 tablet (20 mg total) by mouth at bedtime. 05/28/22 08/26/22  Lorin Glass, MD  Tiotropium Bromide Monohydrate (SPIRIVA RESPIMAT) 2.5 MCG/ACT AERS Inhale 2 puffs into the lungs daily. 05/28/22   Lorin Glass, MD      Allergies    Patient has no known allergies.    Review of Systems   Review of Systems  Respiratory:  Positive for shortness of breath.     Physical Exam Updated Vital Signs BP (!) 115/39   Pulse 82   Temp (!) 97.1 F (36.2 C)   Resp 17   Ht 5\' 4"  (1.626 m)   Wt 42 kg   SpO2 100%   BMI 15.89 kg/m  Physical Exam Constitutional: Alert and following commands, not speaking, ill appearing Eyes: Conjunctivae are normal. ENT      Head: Normocephalic and atraumatic. Neck: no stridor  Cardiovascular: S1, S2,  Normal and symmetric distal pulses are present in all extremities.Warm and well perfused. Respiratory: tachypneic, diffusely decreased air movement  Gastrointestinal: Soft and mild suprapubic ttp Musculoskeletal: no edema of LEs Neurologic: awake, alert, following commands to voice and stimulation but not speaking, encephalopathic Skin: Skin is cool, dry Psychiatric: encephalopathic  ED Results / Procedures / Treatments   Labs (all labs ordered are listed, but only abnormal results are displayed) Labs Reviewed  BASIC METABOLIC PANEL - Abnormal; Notable for the following components:      Result Value   Sodium 147 (*)    Chloride 88 (*)    CO2 >45 (*)    Glucose, Bld 148 (*)    Creatinine, Ser 0.37 (*)    All other components within normal  limits  CBC WITH DIFFERENTIAL/PLATELET - Abnormal; Notable for the following components:   RBC 3.06 (*)    Hemoglobin 9.7 (*)    HCT 33.2 (*)    MCV 108.5 (*)    MCHC 29.2 (*)    All other components within normal limits  BRAIN NATRIURETIC PEPTIDE - Abnormal; Notable for the following components:   B Natriuretic Peptide 115.9 (*)    All other components within normal limits  I-STAT VENOUS BLOOD GAS, ED - Abnormal; Notable for the following components:   pCO2, Ven 91.2 (*)    pO2, Ven 52 (*)    Bicarbonate 56.3 (*)    TCO2 >50 (*)    Acid-Base Excess 27.0 (*)    Calcium, Ion 1.11 (*)    HCT 30.0 (*)    Hemoglobin 10.2 (*)    All other components within normal limits  TROPONIN I (HIGH SENSITIVITY) - Abnormal; Notable for the following components:   Troponin I (High Sensitivity) 30 (*)    All other components within normal limits  CULTURE, BLOOD (ROUTINE X 2)  CULTURE, BLOOD (ROUTINE X 2)  LACTIC ACID, PLASMA    EKG EKG Interpretation  Date/Time:  Friday Jun 04 2022 08:18:18 EDT Ventricular Rate:  89 PR Interval:  144 QRS Duration: 135 QT Interval:  398 QTC Calculation: 485 R Axis:   268 Text Interpretation: bi fasciular block concave st elevation lateral leads present on prior Confirmed by Vivien Rossetti ([id number]) on 06/04/2022 8:24:51 AM  Radiology CT Head Wo Contrast  Result Date: 06/04/2022 CLINICAL DATA:  Altered mental status. EXAM: CT HEAD WITHOUT CONTRAST TECHNIQUE: Contiguous axial images were obtained from the base of the skull through the vertex without intravenous contrast. RADIATION DOSE REDUCTION: This exam was performed according to the departmental dose-optimization program which includes automated exposure control, adjustment of the mA and/or kV according to patient size and/or use of iterative reconstruction technique. COMPARISON:  Head CT 05/24/2022. FINDINGS: Brain: No acute hemorrhage. Unchanged moderate chronic small-vessel disease. Unchanged dystrophic  appearing calcification in the medial aspect of the left occipital lobe. No hydrocephalus or extra-axial collection. No mass effect or midline shift. Vascular: No hyperdense vessel or unexpected calcification. Skull: No calvarial fracture or suspicious bone lesion. Skull base is unremarkable. Sinuses/Orbits: Unremarkable. Other: None. IMPRESSION: 1. No acute intracranial abnormality. 2. Unchanged moderate chronic small-vessel disease. Electronically Signed   By: Orvan Falconer M.D.   On: 06/04/2022 09:50   DG Chest Portable 1 View  Result Date: 06/04/2022 CLINICAL DATA:  Shortness of breath. EXAM: PORTABLE CHEST 1 VIEW COMPARISON:  May 24, 2022. FINDINGS: The heart size and mediastinal contours are within normal limits. Right lung is unremarkable. Small left pleural effusion is noted with adjacent subsegmental atelectasis. The visualized skeletal structures are unremarkable. IMPRESSION: Small left pleural effusion is noted with adjacent subsegmental atelectasis. Aortic Atherosclerosis (  ICD10-I70.0). Electronically Signed   By: Lupita Raider M.D.   On: 06/04/2022 08:47    Procedures .Critical Care  Performed by: Mardene Sayer, MD Authorized by: Mardene Sayer, MD   Critical care provider statement:    Critical care time (minutes):  45   Critical care was necessary to treat or prevent imminent or life-threatening deterioration of the following conditions:  Respiratory failure   Critical care was time spent personally by me on the following activities:  Development of treatment plan with patient or surrogate, evaluation of patient's response to treatment, examination of patient, ordering and review of laboratory studies, ordering and review of radiographic studies, ordering and performing treatments and interventions, pulse oximetry, re-evaluation of patient's condition, review of old charts, discussions with consultants and obtaining history from patient or surrogate     Medications  Ordered in ED Medications  acetaminophen (TYLENOL) suppository 650 mg (has no administration in time range)  ondansetron (ZOFRAN) injection 4 mg (has no administration in time range)  glycopyrrolate (ROBINUL) injection 0.4 mg (has no administration in time range)  antiseptic oral rinse (BIOTENE) solution 15 mL (has no administration in time range)  polyvinyl alcohol (LIQUIFILM TEARS) 1.4 % ophthalmic solution 1 drop (has no administration in time range)  morphine (PF) 2 MG/ML injection 1 mg (has no administration in time range)  LORazepam (ATIVAN) injection 1 mg (has no administration in time range)  magnesium sulfate IVPB 2 g 50 mL (0 g Intravenous Stopped 06/04/22 0949)  ipratropium-albuterol (DUONEB) 0.5-2.5 (3) MG/3ML nebulizer solution 3 mL (3 mLs Nebulization Given 06/04/22 0843)  cefTRIAXone (ROCEPHIN) 1 g in sodium chloride 0.9 % 100 mL IVPB (0 g Intravenous Stopped 06/04/22 4098)    ED Course/ Medical Decision Making/ A&P Clinical Course as of 06/04/22 1516  Fri Jun 04, 2022  1026 Palliative team was consulted upon patient's arrival due to poor condition and worsening encephalopathy and hypercarbia and hypoxic respiratory failure.  Her CT head showed no ICH.  Patient's family decided to go hospice.  Comfort care measures have been ordered by palliative team.  Waiting to see if there is place available for [facility] to try to avoid admission. [VB]    Clinical Course User Index [VB] Mardene Sayer, MD                             Medical Decision Making  Cathy Hensley is a 82 y.o. female. With pmh alpha-1 antitrypsin deficiency, past history of smoking, severe COPD, chronic hypercarbic respiratory failure on 2.5 to 4 L of oxygen at home, recent admission to the hospital 05/24/2022 to 05/28/2022 for encephalopathy in the setting of chronic hypercapnia and UTI brought in by EMS from home for confusion and hypoxia noted to be satting 50s on home oxygen supplementation.  Given  Solu-Medrol 125 mg and 5 of albuterol on route with EMS.  Patient had diffusely decreased breath sounds and hypoxic hypercarbic respiratory failure in the setting of end-stage COPD.  Her VBG today notable for pCO2 91.2.  pH was 7.398 and HCO3 is greater than 45.  There is compensation present but patient more encephalopathic likely all related to hypoxic bicarb and respiratory failure.  She was started on BiPAP and woke up more and pulled off BiPAP mask requesting nasal cannula however still quite encephalopathic.  CT head was negative for ICH.  Chest x-ray obtained no evidence of pneumonia however small left pleural effusion  noted.  Was given magnesium, rocephin and DuoNebs in ED.   Palliative team was consulted upon patient's arrival due to poor condition and worsening encephalopathy and hypercarbia and hypoxic respiratory failure.  Her CT head showed no ICH.  Patient's family decided to go hospice.  Comfort care measures have been ordered by palliative team.  Waiting to see if there is place available for [facility] to try to avoid admission. [VB]  Patient accepted to [facility] today for hospice care.  Amount and/or Complexity of Data Reviewed Labs: ordered. Radiology: ordered.  Risk Prescription drug management.      Final Clinical Impression(s) / ED Diagnoses Final diagnoses:  Encephalopathy  Acute respiratory failure with hypoxia and hypercarbia [facility])    Rx / DC Orders ED Discharge Orders     None         Mardene Sayer, MD 06/04/22 [phone]

## 2022-06-04 NOTE — Telephone Encounter (Signed)
Ok

## 2022-06-04 NOTE — Consult Note (Signed)
Consultation Note Date: 06/04/2022   Patient Name: Cathy Hensley  DOB: 1940-12-25  MRN: [id number]  Age / Sex: 82 y.o., female  PCP: Deeann Saint, MD Referring Physician: Mardene Sayer, MD  Reason for Consultation: Establishing goals of care  HPI/Patient Profile: 82 y.o. female  with past medical history of  alpha-1 antitrypsin deficiency, severe COPD, chronic hypercarbic respiratory failure on 2.5 to 4 L of oxygen at home, not on positive pressure ventilation, follows up with pulmonology [name] as an outpatient, former smoker, chronic short-term memory loss, hypertension, hyperlipidemia who presents to the ED on 06/04/2022 with O2 desaturations and LOC at home.   Patient oxygen dropped to 56% at home.  Patient regained consciousness and has been disoriented and minimally responsive.  Discussed with EDP.  Patient is suffering from end-stage COPD with persistent encephalopathy, limited treatment options, and preference to avoid BiPAP.   PMT has been consulted to assist with goals of care conversation.  GOC discussion with our team last week during 5/20-5/24 hospitalization indicated that family was open to considering hospice if patient continued to decline after trial of [facility] PT/outpatient palliative.  Clinical Assessment and Goals of Care:  I have reviewed medical records including EPIC notes, labs and imaging, received report from RN, assessed the patient and then met at the bedside with patient's daughter/HCPOA Cathy Hensley and husband Cathy Hensley to discuss diagnosis prognosis, GOC, EOL wishes, disposition and options.  I introduced Palliative Medicine as specialized medical care for people living with serious illness. It focuses on providing relief from the symptoms and stress of a serious illness. The goal is to improve quality of life for both the patient and the family.  We discussed a brief life review of  the patient and then focused on their current illness.  The natural disease trajectory and expectations at EOL were discussed.  I attempted to elicit values and goals of care important to the patient.    Medical History Review and Understanding:  Reviewed patient's course of illness since discharge, as well as her chronic comorbidities.  Patient's family have a good understanding of the severity of her illness and that COPD is an irreversible and progressive disease.   Social History: Patient is married with 2 daughters.  She is a Air traffic controller.  She is originally from [place].  She lives at home with her husband and has required 24/7 caregivers through [employer] since her last admission.  Functional and Nutritional State: Patient continued to decline with recurrent hospitalizations and increasing oxygen requirements.  Cognitive decline has rapidly progressed.  Palliative Symptoms: Dyspnea  Advance Directives: A detailed discussion regarding advanced directives was had.  Patient's daughter Cathy Hensley is primary HCPOA and daughter Cathy Hensley secondary HCPOA.   Code Status: Concepts specific to code status, artifical feeding and hydration, and rehospitalization were considered and discussed.  DNR/DNI confirmed.  Discussion: Patient's family have difficulty with medical decision making and processing patient's poor overall prognosis, given that she has recovered from similar instances of COPD exacerbation in the past.  Emotional support and therapeutic listening was provided.  Discussed risk and benefits of BiPAP treatment, emphasizing that this is not a long-term solution and especially that patient's care preferences should be considered as the top priority.  She has tried to take this off already this morning and has been clear with family that she strongly dislikes coming to the hospital.   Reviewed that patient is now saturating 45% on her normal baseline oxygen of 4 L.  We  discussed how her  quality of life has been as well as what this might look like moving forward with continuing life-prolonging interventions.  I shared my concern that continuing medical interventions such as BiPAP would prolong the dying process rather than improve patient's quality of life or prognosis.  Recommended comfort focused care, explaining that patient would no longer receive aggressive medical interventions such as continuous vital signs, lab work, radiology testing, or medications not focused on comfort. All care would focus on how the patient is looking and feeling. This would include management of any symptoms that may cause discomfort, pain, shortness of breath, cough, nausea, agitation, anxiety, and/or secretions etc. Symptoms would be managed with medications and other non-pharmacological interventions such as spiritual support if requested, repositioning, music therapy, or therapeutic listening. Family verbalized understanding and appreciation.  They are agreeable.  They are also agreeable with referral to hospice facility, as patient's care likely would be complicated at home with high risk for worsening symptoms of air hunger and need for IV medications to keep patient comfortable.    The difference between aggressive medical intervention and comfort care was considered in light of the patient's goals of care. Hospice and Palliative Care services outpatient were explained and offered.   Discussed the importance of continued conversation with family and the medical providers regarding overall plan of care and treatment options, ensuring decisions are within the context of the patient's values and GOCs.   Questions and concerns were addressed. The family was encouraged to call with questions or concerns.  PMT will continue to support holistically.   SUMMARY OF RECOMMENDATIONS   -Continue DNR/DNI -Transition to full comfort measures after discussion with patient's family Morphine PRN for pain/air  hunger/comfort Robinul PRN for excessive secretions Ativan PRN for agitation/anxiety Zofran PRN for nausea Liquifilm tears PRN for dry eyesComfort cart for family Unrestricted visitations in the setting of EOL (per policy) Oxygen PRN 4L or less for comfort. No escalation.   -TOC consulted for assistance with referral to residential hospice rather than admission, assistance is appreciated -Psychosocial emotional support provided -PMT will continue to follow and support  Prognosis:  < 2 weeks  Discharge Planning: Hospice facility      Primary Diagnoses: Present on Admission: **None**   Physical Exam Vitals and nursing note reviewed.  Constitutional:      General: She is not in acute distress.    Appearance: She is ill-appearing.     Interventions: Nasal cannula and face mask in place.  Cardiovascular:     Rate and Rhythm: Normal rate.  Pulmonary:     Effort: Pulmonary effort is normal. No respiratory distress.  Neurological:     Mental Status: She is unresponsive.  Psychiatric:        Cognition and Memory: Cognition is impaired.     Vital Signs: BP (!) 180/60   Pulse 92   Temp (!) 97.1 F (36.2 C) (Oral)   Resp (!) 24   Ht 5\' 4"  (1.626 m)   Wt 42 kg   SpO2 100%   BMI 15.89 kg/m  Pain Scale: PAINAD      SpO2: SpO2: 100 % O2 Device:SpO2: 100 % O2 Flow Rate: .    Palliative Assessment/Data: 10%     MDM: High    Ch[place] Notz Jeni Salles, PA-C  Palliative Medicine Team Team phone # [phone]  Thank you for allowing the Palliative Medicine Team to assist in the care of this patient. Please utilize secure chat with additional questions, if  there is no response within 30 minutes please call the above phone number.  Palliative Medicine Team providers are available by phone from 7am to 7pm daily and can be reached through the team cell phone.  Should this patient require assistance outside of these hours, please call the patient's attending physician.

## 2022-06-04 NOTE — ED Triage Notes (Signed)
Lives at home with chronic 4-5LPM [place]. O2 dropped to 56% while in the bathroom. Home health RN called 911. EMS gave 5mg  Albuterol and 125mg  solumedrol. Alert and oriented at baseline. Pt is now disoriented and unable to follow commands. 100% on non rebreather by EMS.

## 2022-06-04 NOTE — ED Notes (Signed)
PTAR  in 1 hour

## 2022-06-04 NOTE — Progress Notes (Signed)
Transition of Care [facility]) - Emergency Department Mini Assessment   Patient Details  Name: Cathy Hensley MRN: [id number] Date of Birth: 18-Dec-1940  Transition of Care [facility]) CM/SW Contact:    Oletta Cohn, RN Phone Number: 06/04/2022, 11:13 AM   Clinical Narrative: RNCM consulted regarding offering choice for residential hospice.  RNCM met with spouse and daughter at bedside and was advised that they became recently active with [facility] Palliative services and would like to continue with them.  RNCM reached out to [employer], SW liaison on call to notify her of pt ED admission and need for residential hospice evaluation. RNCM will continue to follow for additional needs.   ED Mini Assessment: What brought you to the Emergency Department? : (P) End of life evaluation  Barriers to Discharge: (P) Hospice Bed not available  Barrier interventions: (P) contacted [facility]  Means of departure: (P) Ambulance  Interventions which prevented an admission or readmission: (P) Hospice    Patient Contact and Communications     Spoke with: (P) spouse and daughter  ,            CMS Medicare.gov Compare Post Acute Care list provided to:: (P) Patient Represenative (must comment) (spouse and daughter) Choice offered to / list presented to : (P) Spouse, Adult Children  Admission diagnosis:  Weakness, [facility] Patient Active Problem List   Diagnosis Date Noted   Palliative care by specialist 05/26/2022   Goals of care, counseling/discussion 05/26/2022   DNR (do not resuscitate) 05/26/2022   Chronic obstructive pulmonary disease (HCC) 05/26/2022   Acute delirium 05/24/2022   Acute metabolic encephalopathy 05/24/2022   Edema both LE's 05/04/2022   Lung nodule 05/11/2021   Syncope 03/09/2019   CAP (community acquired pneumonia) 12/07/2016   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 01/22/2015   Chronic respiratory failure with hypoxia and hypercapnia (HCC) 09/24/2014    Protein calorie malnutrition (HCC)    COPD exacerbation (HCC)    Dyslipidemia 05/03/2011   Other and unspecified hyperlipidemia 07/13/2006   Essential hypertension 07/13/2006   COPD GOLD IV  07/13/2006   PCP:  Deeann Saint, MD Pharmacy:   [facility] [facility] [address] - [place], [place] - [address] [place] [address] Phone: [phone] Fax: [phone]  [id number] [place], [place] - [address] [facility] [address] [facility] [address] [place] [address] Phone: [phone] Fax: [phone]  [facility] - [place], [place] - [address] [employer]. [address] [place] [address] Phone: [phone] Fax: [phone]  [facility] [address] [place] [address] Phone: [phone] Fax: [phone]

## 2022-06-06 LAB — CULTURE, BLOOD (ROUTINE X 2)
Culture: NO GROWTH
Culture: NO GROWTH

## 2022-06-07 ENCOUNTER — Telehealth: Payer: Self-pay | Admitting: Internal Medicine

## 2022-06-07 LAB — CULTURE, BLOOD (ROUTINE X 2)

## 2022-06-07 NOTE — Telephone Encounter (Signed)
[name] just an [facility] Client can you please close patients chart

## 2022-06-08 ENCOUNTER — Other Ambulatory Visit: Payer: Self-pay

## 2022-06-08 LAB — CULTURE, BLOOD (ROUTINE X 2)

## 2022-06-09 LAB — CULTURE, BLOOD (ROUTINE X 2)

## 2022-07-05 DEATH — deceased

## 2022-07-26 ENCOUNTER — Ambulatory Visit: Payer: Medicare Other | Admitting: Family Medicine
